# Patient Record
Sex: Female | Born: 1935 | Race: White | Hispanic: No | Smoking: Former smoker
Health system: Southern US, Community
[De-identification: ages and names within clinical notes are randomized; demographics above are authoritative.]

## PROBLEM LIST (undated history)

## (undated) DIAGNOSIS — M069 Rheumatoid arthritis, unspecified: Secondary | ICD-10-CM

## (undated) DIAGNOSIS — E559 Vitamin D deficiency, unspecified: Secondary | ICD-10-CM

## (undated) DIAGNOSIS — I272 Pulmonary hypertension, unspecified: Secondary | ICD-10-CM

## (undated) DIAGNOSIS — D649 Anemia, unspecified: Secondary | ICD-10-CM

## (undated) DIAGNOSIS — E119 Type 2 diabetes mellitus without complications: Secondary | ICD-10-CM

## (undated) DIAGNOSIS — M519 Unspecified thoracic, thoracolumbar and lumbosacral intervertebral disc disorder: Secondary | ICD-10-CM

## (undated) DIAGNOSIS — D696 Thrombocytopenia, unspecified: Secondary | ICD-10-CM

## (undated) DIAGNOSIS — E785 Hyperlipidemia, unspecified: Secondary | ICD-10-CM

## (undated) DIAGNOSIS — K7460 Unspecified cirrhosis of liver: Secondary | ICD-10-CM

## (undated) DIAGNOSIS — IMO0002 Reserved for concepts with insufficient information to code with codable children: Secondary | ICD-10-CM

## (undated) DIAGNOSIS — Z993 Dependence on wheelchair: Secondary | ICD-10-CM

## (undated) DIAGNOSIS — R32 Unspecified urinary incontinence: Secondary | ICD-10-CM

## (undated) DIAGNOSIS — J849 Interstitial pulmonary disease, unspecified: Secondary | ICD-10-CM

## (undated) DIAGNOSIS — I4891 Unspecified atrial fibrillation: Secondary | ICD-10-CM

## (undated) DIAGNOSIS — IMO0001 Reserved for inherently not codable concepts without codable children: Secondary | ICD-10-CM

## (undated) DIAGNOSIS — I2510 Atherosclerotic heart disease of native coronary artery without angina pectoris: Secondary | ICD-10-CM

## (undated) DIAGNOSIS — M359 Systemic involvement of connective tissue, unspecified: Secondary | ICD-10-CM

## (undated) DIAGNOSIS — N189 Chronic kidney disease, unspecified: Secondary | ICD-10-CM

## (undated) DIAGNOSIS — M1990 Unspecified osteoarthritis, unspecified site: Secondary | ICD-10-CM

## (undated) DIAGNOSIS — G4733 Obstructive sleep apnea (adult) (pediatric): Secondary | ICD-10-CM

## (undated) DIAGNOSIS — R609 Edema, unspecified: Secondary | ICD-10-CM

## (undated) DIAGNOSIS — Z515 Encounter for palliative care: Secondary | ICD-10-CM

## (undated) DIAGNOSIS — M4800 Spinal stenosis, site unspecified: Secondary | ICD-10-CM

## (undated) DIAGNOSIS — E878 Other disorders of electrolyte and fluid balance, not elsewhere classified: Secondary | ICD-10-CM

## (undated) DIAGNOSIS — I5032 Chronic diastolic (congestive) heart failure: Secondary | ICD-10-CM

## (undated) DIAGNOSIS — K222 Esophageal obstruction: Secondary | ICD-10-CM

## (undated) DIAGNOSIS — N179 Acute kidney failure, unspecified: Secondary | ICD-10-CM

## (undated) DIAGNOSIS — I10 Essential (primary) hypertension: Secondary | ICD-10-CM

## (undated) DIAGNOSIS — K219 Gastro-esophageal reflux disease without esophagitis: Secondary | ICD-10-CM

## (undated) DIAGNOSIS — E669 Obesity, unspecified: Secondary | ICD-10-CM

## (undated) HISTORY — DX: Essential (primary) hypertension: I10

## (undated) HISTORY — PX: ROTATOR CUFF REPAIR: SHX139

## (undated) HISTORY — DX: Unspecified thoracic, thoracolumbar and lumbosacral intervertebral disc disorder: M51.9

## (undated) HISTORY — DX: Systemic involvement of connective tissue, unspecified: M35.9

## (undated) HISTORY — PX: TUBAL LIGATION: SHX77

## (undated) HISTORY — PX: REPLACEMENT TOTAL KNEE: SUR1224

## (undated) HISTORY — DX: Reserved for concepts with insufficient information to code with codable children: IMO0002

## (undated) HISTORY — DX: Rheumatoid arthritis, unspecified: M06.9

## (undated) HISTORY — DX: Unspecified urinary incontinence: R32

## (undated) HISTORY — DX: Spinal stenosis, site unspecified: M48.00

## (undated) HISTORY — DX: Unspecified cirrhosis of liver: K74.60

## (undated) HISTORY — DX: Atherosclerotic heart disease of native coronary artery without angina pectoris: I25.10

## (undated) HISTORY — DX: Hyperlipidemia, unspecified: E78.5

## (undated) HISTORY — DX: Unspecified atrial fibrillation: I48.91

## (undated) HISTORY — DX: Gastro-esophageal reflux disease without esophagitis: K21.9

## (undated) HISTORY — DX: Acute kidney failure, unspecified: N17.9

## (undated) HISTORY — DX: Edema, unspecified: R60.9

## (undated) HISTORY — DX: Type 2 diabetes mellitus without complications: E11.9

## (undated) HISTORY — DX: Esophageal obstruction: K22.2

## (undated) HISTORY — DX: Obesity, unspecified: E66.9

## (undated) HISTORY — DX: Other disorders of electrolyte and fluid balance, not elsewhere classified: E87.8

## (undated) HISTORY — DX: Dependence on wheelchair: Z99.3

## (undated) HISTORY — DX: Reserved for inherently not codable concepts without codable children: IMO0001

## (undated) HISTORY — DX: Encounter for palliative care: Z51.5

## (undated) HISTORY — DX: Morbid (severe) obesity due to excess calories: E66.01

## (undated) HISTORY — DX: Obstructive sleep apnea (adult) (pediatric): G47.33

## (undated) HISTORY — DX: Pulmonary hypertension, unspecified: I27.20

## (undated) HISTORY — PX: ANKLE FUSION: SHX881

## (undated) HISTORY — PX: APPENDECTOMY: SHX54

## (undated) HISTORY — DX: Chronic diastolic (congestive) heart failure: I50.32

## (undated) HISTORY — DX: Unspecified osteoarthritis, unspecified site: M19.90

## (undated) HISTORY — DX: Vitamin D deficiency, unspecified: E55.9

## (undated) HISTORY — DX: Interstitial pulmonary disease, unspecified: J84.9

## (undated) HISTORY — DX: Anemia, unspecified: D64.9

---

## 1997-12-06 ENCOUNTER — Ambulatory Visit (HOSPITAL_COMMUNITY): Admission: RE | Admit: 1997-12-06 | Discharge: 1997-12-06 | Payer: Self-pay | Admitting: Obstetrics & Gynecology

## 1998-05-09 ENCOUNTER — Inpatient Hospital Stay (HOSPITAL_COMMUNITY): Admission: AD | Admit: 1998-05-09 | Discharge: 1998-05-11 | Payer: Self-pay | Admitting: Gastroenterology

## 1998-06-30 ENCOUNTER — Encounter: Payer: Self-pay | Admitting: Orthopaedic Surgery

## 1998-07-03 ENCOUNTER — Inpatient Hospital Stay (HOSPITAL_COMMUNITY): Admission: RE | Admit: 1998-07-03 | Discharge: 1998-07-09 | Payer: Self-pay | Admitting: Orthopaedic Surgery

## 1998-11-03 ENCOUNTER — Other Ambulatory Visit: Admission: RE | Admit: 1998-11-03 | Discharge: 1998-11-03 | Payer: Self-pay | Admitting: Obstetrics and Gynecology

## 1998-12-19 ENCOUNTER — Encounter: Admission: RE | Admit: 1998-12-19 | Discharge: 1999-03-19 | Payer: Self-pay | Admitting: Orthopaedic Surgery

## 1999-07-24 ENCOUNTER — Emergency Department (HOSPITAL_COMMUNITY): Admission: EM | Admit: 1999-07-24 | Discharge: 1999-07-24 | Payer: Self-pay

## 1999-07-30 ENCOUNTER — Emergency Department (HOSPITAL_COMMUNITY): Admission: EM | Admit: 1999-07-30 | Discharge: 1999-07-30 | Payer: Self-pay | Admitting: Emergency Medicine

## 1999-09-28 ENCOUNTER — Ambulatory Visit (HOSPITAL_COMMUNITY): Admission: RE | Admit: 1999-09-28 | Discharge: 1999-09-29 | Payer: Self-pay | Admitting: Cardiology

## 1999-10-19 ENCOUNTER — Other Ambulatory Visit: Admission: RE | Admit: 1999-10-19 | Discharge: 1999-10-19 | Payer: Self-pay | Admitting: Obstetrics and Gynecology

## 1999-10-19 ENCOUNTER — Ambulatory Visit (HOSPITAL_COMMUNITY): Admission: RE | Admit: 1999-10-19 | Discharge: 1999-10-19 | Payer: Self-pay | Admitting: Neurology

## 1999-10-27 ENCOUNTER — Encounter (HOSPITAL_COMMUNITY): Admission: RE | Admit: 1999-10-27 | Discharge: 2000-01-25 | Payer: Self-pay | Admitting: Cardiology

## 1999-12-03 ENCOUNTER — Encounter: Payer: Self-pay | Admitting: Obstetrics and Gynecology

## 1999-12-08 ENCOUNTER — Encounter (INDEPENDENT_AMBULATORY_CARE_PROVIDER_SITE_OTHER): Payer: Self-pay

## 1999-12-08 ENCOUNTER — Observation Stay (HOSPITAL_COMMUNITY): Admission: RE | Admit: 1999-12-08 | Discharge: 1999-12-09 | Payer: Self-pay | Admitting: Obstetrics and Gynecology

## 2000-07-04 ENCOUNTER — Encounter: Admission: RE | Admit: 2000-07-04 | Discharge: 2000-07-04 | Payer: Self-pay | Admitting: Orthopaedic Surgery

## 2000-07-04 ENCOUNTER — Encounter: Payer: Self-pay | Admitting: Orthopaedic Surgery

## 2000-07-22 ENCOUNTER — Encounter: Payer: Self-pay | Admitting: Orthopaedic Surgery

## 2000-07-22 ENCOUNTER — Encounter: Admission: RE | Admit: 2000-07-22 | Discharge: 2000-07-22 | Payer: Self-pay | Admitting: Orthopaedic Surgery

## 2000-08-08 ENCOUNTER — Encounter: Payer: Self-pay | Admitting: Orthopaedic Surgery

## 2000-08-08 ENCOUNTER — Encounter: Admission: RE | Admit: 2000-08-08 | Discharge: 2000-08-08 | Payer: Self-pay | Admitting: Orthopaedic Surgery

## 2000-09-28 ENCOUNTER — Encounter: Admission: RE | Admit: 2000-09-28 | Discharge: 2000-12-27 | Payer: Self-pay | Admitting: Family Medicine

## 2000-10-28 ENCOUNTER — Other Ambulatory Visit: Admission: RE | Admit: 2000-10-28 | Discharge: 2000-10-28 | Payer: Self-pay | Admitting: Obstetrics and Gynecology

## 2001-06-27 ENCOUNTER — Encounter: Admission: RE | Admit: 2001-06-27 | Discharge: 2001-06-27 | Payer: Self-pay | Admitting: Family Medicine

## 2001-06-27 ENCOUNTER — Encounter: Payer: Self-pay | Admitting: Family Medicine

## 2001-07-14 ENCOUNTER — Encounter: Payer: Self-pay | Admitting: Family Medicine

## 2001-07-14 ENCOUNTER — Encounter: Admission: RE | Admit: 2001-07-14 | Discharge: 2001-07-14 | Payer: Self-pay | Admitting: Family Medicine

## 2001-10-27 ENCOUNTER — Encounter: Payer: Self-pay | Admitting: Orthopaedic Surgery

## 2001-10-30 ENCOUNTER — Other Ambulatory Visit: Admission: RE | Admit: 2001-10-30 | Discharge: 2001-10-30 | Payer: Self-pay | Admitting: Obstetrics and Gynecology

## 2001-11-02 ENCOUNTER — Inpatient Hospital Stay (HOSPITAL_COMMUNITY): Admission: RE | Admit: 2001-11-02 | Discharge: 2001-11-06 | Payer: Self-pay | Admitting: Orthopaedic Surgery

## 2002-01-02 ENCOUNTER — Inpatient Hospital Stay (HOSPITAL_COMMUNITY): Admission: RE | Admit: 2002-01-02 | Discharge: 2002-01-05 | Payer: Self-pay | Admitting: Orthopaedic Surgery

## 2002-03-09 ENCOUNTER — Emergency Department (HOSPITAL_COMMUNITY): Admission: EM | Admit: 2002-03-09 | Discharge: 2002-03-09 | Payer: Self-pay | Admitting: Emergency Medicine

## 2002-03-09 ENCOUNTER — Encounter: Payer: Self-pay | Admitting: Emergency Medicine

## 2002-04-12 ENCOUNTER — Encounter: Admission: RE | Admit: 2002-04-12 | Discharge: 2002-04-12 | Payer: Self-pay | Admitting: Neurology

## 2002-04-12 ENCOUNTER — Encounter: Payer: Self-pay | Admitting: Neurology

## 2002-05-09 ENCOUNTER — Inpatient Hospital Stay (HOSPITAL_COMMUNITY): Admission: RE | Admit: 2002-05-09 | Discharge: 2002-05-14 | Payer: Self-pay | Admitting: Orthopedic Surgery

## 2002-05-09 ENCOUNTER — Encounter: Payer: Self-pay | Admitting: Orthopedic Surgery

## 2002-09-18 ENCOUNTER — Ambulatory Visit (HOSPITAL_BASED_OUTPATIENT_CLINIC_OR_DEPARTMENT_OTHER): Admission: RE | Admit: 2002-09-18 | Discharge: 2002-09-18 | Payer: Self-pay | Admitting: Orthopedic Surgery

## 2003-12-16 ENCOUNTER — Encounter: Admission: RE | Admit: 2003-12-16 | Discharge: 2003-12-16 | Payer: Self-pay | Admitting: Orthopedic Surgery

## 2003-12-17 ENCOUNTER — Ambulatory Visit (HOSPITAL_BASED_OUTPATIENT_CLINIC_OR_DEPARTMENT_OTHER): Admission: RE | Admit: 2003-12-17 | Discharge: 2003-12-17 | Payer: Self-pay | Admitting: Orthopedic Surgery

## 2003-12-17 ENCOUNTER — Ambulatory Visit (HOSPITAL_COMMUNITY): Admission: RE | Admit: 2003-12-17 | Discharge: 2003-12-17 | Payer: Self-pay | Admitting: Orthopedic Surgery

## 2004-01-01 ENCOUNTER — Encounter: Admission: RE | Admit: 2004-01-01 | Discharge: 2004-01-01 | Payer: Self-pay | Admitting: Orthopedic Surgery

## 2004-01-07 ENCOUNTER — Other Ambulatory Visit: Admission: RE | Admit: 2004-01-07 | Discharge: 2004-01-07 | Payer: Self-pay | Admitting: *Deleted

## 2004-11-20 ENCOUNTER — Encounter: Admission: RE | Admit: 2004-11-20 | Discharge: 2004-11-20 | Payer: Self-pay | Admitting: Rheumatology

## 2006-01-12 ENCOUNTER — Other Ambulatory Visit: Admission: RE | Admit: 2006-01-12 | Discharge: 2006-01-12 | Payer: Self-pay | Admitting: Obstetrics and Gynecology

## 2006-12-15 ENCOUNTER — Encounter: Payer: Self-pay | Admitting: Pulmonary Disease

## 2008-02-29 ENCOUNTER — Inpatient Hospital Stay (HOSPITAL_COMMUNITY): Admission: RE | Admit: 2008-02-29 | Discharge: 2008-03-05 | Payer: Self-pay | Admitting: Orthopedic Surgery

## 2008-05-08 ENCOUNTER — Encounter: Payer: Self-pay | Admitting: Pulmonary Disease

## 2008-06-07 ENCOUNTER — Ambulatory Visit: Payer: Self-pay | Admitting: Vascular Surgery

## 2008-09-09 ENCOUNTER — Encounter: Payer: Self-pay | Admitting: Pulmonary Disease

## 2008-09-10 ENCOUNTER — Encounter: Payer: Self-pay | Admitting: Pulmonary Disease

## 2008-09-26 ENCOUNTER — Encounter: Payer: Self-pay | Admitting: Pulmonary Disease

## 2008-10-02 ENCOUNTER — Encounter: Payer: Self-pay | Admitting: Pulmonary Disease

## 2008-10-16 ENCOUNTER — Encounter: Admission: RE | Admit: 2008-10-16 | Discharge: 2008-10-16 | Payer: Self-pay | Admitting: Family Medicine

## 2008-10-16 ENCOUNTER — Encounter: Payer: Self-pay | Admitting: Pulmonary Disease

## 2008-10-28 ENCOUNTER — Telehealth: Payer: Self-pay | Admitting: Pulmonary Disease

## 2008-10-29 DIAGNOSIS — I2510 Atherosclerotic heart disease of native coronary artery without angina pectoris: Secondary | ICD-10-CM | POA: Insufficient documentation

## 2008-10-29 DIAGNOSIS — IMO0001 Reserved for inherently not codable concepts without codable children: Secondary | ICD-10-CM

## 2008-10-29 DIAGNOSIS — M069 Rheumatoid arthritis, unspecified: Secondary | ICD-10-CM | POA: Insufficient documentation

## 2008-10-29 DIAGNOSIS — K219 Gastro-esophageal reflux disease without esophagitis: Secondary | ICD-10-CM

## 2008-10-29 DIAGNOSIS — IMO0002 Reserved for concepts with insufficient information to code with codable children: Secondary | ICD-10-CM

## 2008-10-29 DIAGNOSIS — M1990 Unspecified osteoarthritis, unspecified site: Secondary | ICD-10-CM | POA: Insufficient documentation

## 2008-10-29 DIAGNOSIS — E119 Type 2 diabetes mellitus without complications: Secondary | ICD-10-CM | POA: Insufficient documentation

## 2008-10-30 ENCOUNTER — Ambulatory Visit: Payer: Self-pay | Admitting: Pulmonary Disease

## 2008-10-30 DIAGNOSIS — J8410 Pulmonary fibrosis, unspecified: Secondary | ICD-10-CM

## 2008-11-20 ENCOUNTER — Ambulatory Visit: Payer: Self-pay | Admitting: Pulmonary Disease

## 2008-11-25 ENCOUNTER — Encounter: Payer: Self-pay | Admitting: Pulmonary Disease

## 2008-12-05 ENCOUNTER — Encounter: Payer: Self-pay | Admitting: Pulmonary Disease

## 2009-01-08 DIAGNOSIS — N179 Acute kidney failure, unspecified: Secondary | ICD-10-CM

## 2009-01-08 HISTORY — DX: Acute kidney failure, unspecified: N17.9

## 2009-01-29 ENCOUNTER — Inpatient Hospital Stay (HOSPITAL_COMMUNITY): Admission: EM | Admit: 2009-01-29 | Discharge: 2009-02-10 | Payer: Self-pay | Admitting: Emergency Medicine

## 2009-02-08 HISTORY — PX: CORONARY ANGIOPLASTY: SHX604

## 2009-03-31 ENCOUNTER — Inpatient Hospital Stay (HOSPITAL_COMMUNITY): Admission: RE | Admit: 2009-03-31 | Discharge: 2009-04-01 | Payer: Self-pay | Admitting: Cardiology

## 2009-05-22 ENCOUNTER — Ambulatory Visit: Payer: Self-pay | Admitting: Pulmonary Disease

## 2009-05-22 DIAGNOSIS — I2789 Other specified pulmonary heart diseases: Secondary | ICD-10-CM

## 2009-05-29 ENCOUNTER — Telehealth: Payer: Self-pay | Admitting: Pulmonary Disease

## 2009-11-17 ENCOUNTER — Ambulatory Visit: Payer: Self-pay | Admitting: Pulmonary Disease

## 2010-03-01 ENCOUNTER — Encounter: Payer: Self-pay | Admitting: Orthopedic Surgery

## 2010-03-10 NOTE — Assessment & Plan Note (Signed)
Summary: rov for ISLD, pulmonary HTN   Copy to:  Arlina Robes Primary Provider/Referring Provider:  Henrine Screws  CC:  followup on pfts.  History of Present Illness: the pt comes in today for f/u of her known ISLD and probable multifactorial pulmonary htn.  She has not seen a big change in her breathing since the last visit, and has had full pfts today.  These show no airflow obstruction, no restriction, and a severe decrease in dlco that corrects to normal with alveolar volume adjustment.  Her TLC and DLCO have mildly decreased over the last one year.  The pt denies any chronic cough or mucus production.  She has been diagnosed with osa since the last visit, and is wearing cpap with oxygen at night.  She also continues to have issues with LE edema.  Preventive Screening-Counseling & Management  Alcohol-Tobacco     Smoking Status: quit > 6 months     Year Quit: 1979  Current Medications (verified): 1)  Requip 0.5 Mg Tabs (Ropinirole Hcl) .... Take 1 Tablet By Mouth Two Times A Day 2)  Plaquenil 200 Mg Tabs (Hydroxychloroquine Sulfate) .... Take 1 Tablet By Mouth Two Times A Day 3)  Folic Acid 1 Mg Tabs (Folic Acid) .... Take 1 Tablet By Mouth Once A Day 4)  Amoxicillin 500 Mg Caps (Amoxicillin) .... Take 4 Tabs 1 Hour Before Dental Work 5)  Multivitamins  Tabs (Multiple Vitamin) .... Take 1 Tablet By Mouth Once A Day 6)  Imodium Advanced 2-125 Mg Tabs (Loperamide-Simethicone) .... Take 1 Tablet By Mouth Two Times A Day 7)  Ecotrin Low Strength 81 Mg Tbec (Aspirin) .... Take 1 Tab By Mouth At Bedtime 8)  Calcium-Phosphorus- .... Take 1 Tablet By Mouth Once A Day 9)  Coq 10 .... Take 1 Tablet By Mouth Two Times A Day 10)  Biotin .... Take 1 Tablet By Mouth Two Times A Day 11)  Vitamin D3 .... Take 1 Tablet By Mouth Once A Day 12)  Glucosamine .... Take 1 Tablet By Mouth Once A Day 13)  Resveratrol .... Take 1 Tablet By Mouth Once A Day 14)  Furosemide 20 Mg Tabs  (Furosemide) .Marland Kitchen.. 1 Once Daily 15)  Lisinopril 10 Mg Tabs (Lisinopril) .... 1/2 Once Daily 16)  Tramadol Hcl 50 Mg Tabs (Tramadol Hcl) .Marland Kitchen.. 1 At Night 17)  Effient 10 Mg Tabs (Prasugrel Hcl) .Marland Kitchen.. 1 By Mouth Qd 18)  Metoprolol Tartrate 50 Mg Tabs (Metoprolol Tartrate) .Marland Kitchen.. 1 and 1/2 Two Times A Day 19)  Simcor 1000-20 Mg Xr24h-Tab (Niacin-Simvastatin) .Marland Kitchen.. 1at Bedtime 20)  Vanadium 7.5 Mg Caps (Vanadium) .Marland Kitchen.. 1qd  Allergies: 1)  ! * Ivp Dye 2)  ! Plavix (Clopidogrel Bisulfate)  Past History:  Past medical, surgical, family and social histories (including risk factors) reviewed, and no changes noted (except as noted below).  Past Medical History: Reviewed history from 10/30/2008 and no changes required.   FIBROMYALGIA (ICD-729.1) GERD (ICD-530.81) DEGENERATIVE DISC DISEASE (ICD-722.6) MORBID OBESITY (ICD-278.01) OSTEOARTHRITIS (ICD-715.90) ARTHRITIS, RHEUMATOID (ICD-714.0)??? DIABETES MELLITUS, TYPE II (ICD-250.00) CORONARY ARTERY DISEASE (ICD-414.00)--s/p pci    Past Surgical History: Reviewed history from 10/30/2008 and no changes required. appendectomy 1924 R rotator cuff repair 1998 R ankle fusion 2000 and 2005 R total knee replacement 2000 L total knee replacement 2003 L ankle fusion 2004 stent 2001 tubal ligation 1972  Family History: Reviewed history from 10/30/2008 and no changes required. rheumatism: mother   Social History: Reviewed history from 10/30/2008 and no changes  required. pt is a widow. pt has 2 sons and 6 grandchildren. pt does not drink alcohol.  Patient states former smoker.  started at age 36.  1/2 to 1 ppd.  quit 1979. pt is  a retired Runner, broadcasting/film/video.   Smoking Status:  quit > 6 months  Review of Systems       The patient complains of headaches, nasal congestion/difficulty breathing through nose, hand/feet swelling, and joint stiffness or pain.  The patient denies shortness of breath with activity, shortness of breath at rest, productive cough,  non-productive cough, coughing up blood, chest pain, irregular heartbeats, acid heartburn, indigestion, loss of appetite, weight change, abdominal pain, difficulty swallowing, sore throat, sneezing, itching, ear ache, anxiety, depression, rash, change in color of mucus, and fever.    Vital Signs:  Patient profile:   77 year old female Height:      61 inches O2 Sat:      93 % on Room air Temp:     98.1 degrees F oral Pulse rate:   58 / minute BP sitting:   140 / 70  (right arm) Cuff size:   large  Vitals Entered By: Kandice Hams CMA (November 17, 2009 10:01 AM)  O2 Flow:  Room air CC: followup on pfts   Physical Exam  General:  morbidly obese female in nad Nose:  no skin breakdown or pressure necrosis from cpap mask Lungs:  crackles in bases bilat, no wheezing Heart:  rrr, no mrg Extremities:  significant LE edema, no cyanosis Neurologic:  alert and oriented, moves all 4.   Impression & Recommendations:  Problem # 1:  INTERSTITIAL LUNG DISEASE (ICD-515) the pt has known ISLD that I suspect is due to her underlying RA.  Her cxr and pfts are not a whole lot different from last year, and she is already being treated by rheumatology for her autoimmune disease.  I have explained to her the mainstay of treatment for this are medications aimed at her RA.  I am not sure she is a great candidate for more aggressive therapy, but will leave that to rheumatology.  Will continue to monitor for objective worsening with cxr's and pfts.  I have also encouraged her to work on weight loss and some type of conditioning program...both would help her doe/QOL.    Problem # 2:  PULMONARY HYPERTENSION (ICD-416.8) the pt has moderated elevation of pressures by echo, but unclear how much due to diastolic dysfunction, her autoimmune disease (doubt), her underlying lung disease, or possibly her sleep apnea which is now being treated.  She is not a good candidate for vasodilator therapy, and therefore would not  do RHC unless her dyspnea worsens considerably.  She does need ongoing diuresis, and is followed closely by cardiology.  Medications Added to Medication List This Visit: 1)  Lisinopril 10 Mg Tabs (Lisinopril) .... 1/2 once daily 2)  Tramadol Hcl 50 Mg Tabs (Tramadol hcl) .Marland Kitchen.. 1 at night 3)  Effient 10 Mg Tabs (Prasugrel hcl) .Marland Kitchen.. 1 by mouth qd 4)  Metoprolol Tartrate 50 Mg Tabs (Metoprolol tartrate) .Marland Kitchen.. 1 and 1/2 two times a day 5)  Simcor 1000-20 Mg Xr24h-tab (Niacin-simvastatin) .Marland Kitchen.. 1at bedtime 6)  Vanadium 7.5 Mg Caps (Vanadium) .Marland Kitchen.. 1qd  Other Orders: Est. Patient Level IV (52841)  Patient Instructions: 1)  will arrange followup with me in 6mos, and will get chest xray the same day.  Please call if having worsening breathing issues 2)  work on weight loss and activity 3)  continue with cpap and nighttime oxygen

## 2010-03-10 NOTE — Letter (Signed)
Summary: [facility]  [facility]   Imported By: Sherian Rein 03/26/2009 14:24:39  _____________________________________________________________________  External Attachment:    Type:   Image     Comment:   External Document

## 2010-03-10 NOTE — Miscellaneous (Signed)
Summary: Orders Update pft charges  Clinical Lists Changes  Orders: Added new Service order of Carbon Monoxide diffusing w/capacity (94720) - Signed Added new Service order of Lung Volumes (94240) - Signed Added new Service order of Spirometry (Pre & Post) (94060) - Signed 

## 2010-03-10 NOTE — Assessment & Plan Note (Signed)
Summary: rov for isld   Copy to:  Arlina Robes Primary Provider/Referring Provider:  Henrine Screws  CC:  6 month follow up pt c/o increase bruising .  History of Present Illness: The pt comes in today for f/u of her known ISLD probably secondary to autoimmune disease.  She is being followed by rheum, and being treated with mtx.  She also has known diastolic dysfunction with moderately elevated pulmonary pressures on echo.  The pt continues to have doe, but feels that her breathing is near her usual baseline.  Her LE edema has been stable.  She denies cough or congestion.  Current Medications (verified): 1)  Requip 0.5 Mg Tabs (Ropinirole Hcl) .... Take 1 Tablet By Mouth Two Times A Day 2)  Plaquenil 200 Mg Tabs (Hydroxychloroquine Sulfate) .... Take 1 Tablet By Mouth Two Times A Day 3)  Celebrex 200 Mg Caps (Celecoxib) .... Take 1 Tablet By Mouth Two Times A Day 4)  Cyclobenzaprine Hcl 1mg  .... Take 1 Tablet By Mouth Three Times A Day 5)  Folic Acid 1 Mg Tabs (Folic Acid) .... Take 1 Tablet By Mouth Once A Day 6)  Promethazine Hcl 25 Mg Tabs (Promethazine Hcl) .... Take 1 Tab By Mouth Every 6 Hours As Needed 7)  Amoxicillin 500 Mg Caps (Amoxicillin) .... Take 4 Tabs 1 Hour Before Dental Work 8)  Multivitamins  Tabs (Multiple Vitamin) .... Take 1 Tablet By Mouth Once A Day 9)  Imodium Advanced 2-125 Mg Tabs (Loperamide-Simethicone) .... Take 1 Tablet By Mouth Two Times A Day 10)  Ecotrin Low Strength 81 Mg Tbec (Aspirin) .... Take 1 Tab By Mouth At Bedtime 11)  Omega 3 Fish Oil .... Take 1 Tablet By Mouth Two Times A Day 12)  Calcium-Phosphorus- .... Take 1 Tablet By Mouth Once A Day 13)  Coq 10 .... Take 1 Tablet By Mouth Two Times A Day 14)  Biotin .... Take 1 Tablet By Mouth Two Times A Day 15)  Vitamin D3 .... Take 1 Tablet By Mouth Once A Day 16)  Glucosamine .... Take 1 Tablet By Mouth Once A Day 17)  Resveratrol .... Take 1 Tablet By Mouth Once A Day 18)   Methotrexate 2.5 Mg Tabs (Methotrexate Sodium) .... 3 Tablets Once A Week 19)  Furosemide 20 Mg Tabs (Furosemide) .Marland Kitchen.. 1 Once Daily 20)  Lisinopril 10 Mg Tabs (Lisinopril) .Marland Kitchen.. 1 Once Daily  Allergies: 1)  ! * Ivp Dye 2)  ! Plavix (Clopidogrel Bisulfate)  Review of Systems      See HPI  Vital Signs:  Patient profile:   77 year old female O2 Sat:      90 % on Room air Temp:     97.8 degrees F oral Pulse rate:   73 / minute BP sitting:   140 / 80  (left arm)  Vitals Entered By: Renold Genta RCP, LPN (May 22, 2009 10:16 AM)  O2 Flow:  Room air CC: 6 month follow up pt c/o increase bruising  Comments Medications reviewed with patient Renold Genta RCP, LPN  May 22, 2009 10:32 AM    Physical Exam  General:  morbidly obese female in nad Lungs:  bibasilar crackles, no wheezing Heart:  rrr, no mrg. Extremities:  2+ edema with venous stasis changes. no cyanosis Neurologic:  alert and oriented, moves all 4.   Impression & Recommendations:  Problem # 1:  INTERSTITIAL LUNG DISEASE (ICD-515) the pt has ISLD that I suspect is related to her  known autoimmune disease.  She has not seen worsening in her breathing, nor is cough an issue for her.  She is being treated primarily with mtx, but this may have to be intensified if she has objective worsening of her disease radiographically or by pfts.  She will f/u in 6mos for her full pfts, but is to call if she has worsening of her dyspnea.  Problem # 2:  PULMONARY HYPERTENSION (ICD-416.8) the pt has moderately elevated PA pressures by her echo, and it is unclear how much of this is due to diastolic dysfunction with pulmonary venous htn, pulm arterial htn related to her autoimmune disease?, or possibly secondary pulm htn related to possible osa/ohs.  She is scheduled for sleep study, and I suspect she has this and would benefit from treatment.  The question to be answered is whether she needs a right heart cath.  It is unclear whether it  would change our treatment regimen.   I do not think she is a great candidate for vasodilator therapy at this point.  If she has worsening of her sob that does not appear to be related to advancing lung disease, would consider RHC and more aggressive therapy if significant pulmonary Arterial htn documented.  For now, she needs better control of fluid balance per cards, and we will see if she has osa that would respond to treatment.  Medications Added to Medication List This Visit: 1)  Methotrexate 2.5 Mg Tabs (Methotrexate sodium) .... 3 tablets once a week 2)  Furosemide 20 Mg Tabs (Furosemide) .Marland Kitchen.. 1 once daily 3)  Lisinopril 10 Mg Tabs (Lisinopril) .Marland Kitchen.. 1 once daily  Other Orders: Est. Patient Level III (16109) T-2 View CXR (71020TC) Pulmonary Referral (Pulmonary)  Patient Instructions: 1)  will check cxr today.  Will call with results 2)  schedule followup with me in 6mos with full breathing tests on the same day. 3)  work on weight loss

## 2010-03-10 NOTE — Progress Notes (Signed)
Summary: results  Phone Note Call from Patient Call back at Home Phone [phone]   Caller: Patient Call For: clance Summary of Call: results. pt aware that kc out of office until monday but she doesn't see why she needs to wait until then.  Initial call taken by: Tivis Ringer, CNA,  May 29, 2009 12:25 PM  Follow-up for Phone Call        spoke with pt. she states she did not want me to send [facility] a messgae requesting results, she states she is ok with waiting until monday fro results. I will still forward messgae to [facility] to make him aware pt called for results. Please advise on CXR results. Thanks. Carron Curie CMA  May 29, 2009 12:41 PM   Additional Follow-up for Phone Call Additional follow up Details #1::        see addendum under cxr report. Additional Follow-up by: Barbaraann Share MD,  May 31, 2009 12:44 PM

## 2010-04-25 LAB — BASIC METABOLIC PANEL
BUN: 18 mg/dL (ref 6–23)
BUN: 28 mg/dL — ABNORMAL HIGH (ref 6–23)
CO2: 25 mEq/L (ref 19–32)
CO2: 27 mEq/L (ref 19–32)
CO2: 27 mEq/L (ref 19–32)
Calcium: 8.5 mg/dL (ref 8.4–10.5)
Calcium: 8.9 mg/dL (ref 8.4–10.5)
Calcium: 9.2 mg/dL (ref 8.4–10.5)
Chloride: 103 mEq/L (ref 96–112)
Chloride: 104 mEq/L (ref 96–112)
Chloride: 106 mEq/L (ref 96–112)
Creatinine, Ser: 1.08 mg/dL (ref 0.4–1.2)
Creatinine, Ser: 1.18 mg/dL (ref 0.4–1.2)
GFR calc Af Amer: 54 mL/min — ABNORMAL LOW (ref >60)
GFR calc Af Amer: 60 mL/min — ABNORMAL LOW (ref >60)
GFR calc non Af Amer: 45 mL/min — ABNORMAL LOW (ref >60)
GFR calc non Af Amer: 49 mL/min — ABNORMAL LOW (ref >60)
Glucose, Bld: 140 mg/dL — ABNORMAL HIGH (ref 70–99)
Glucose, Bld: 144 mg/dL — ABNORMAL HIGH (ref 70–99)
Glucose, Bld: 154 mg/dL — ABNORMAL HIGH (ref 70–99)
Potassium: 4.9 mEq/L (ref 3.5–5.1)
Potassium: 6.1 mEq/L — ABNORMAL HIGH (ref 3.5–5.1)
Sodium: 135 mEq/L (ref 135–145)
Sodium: 137 mEq/L (ref 135–145)
Sodium: 138 mEq/L (ref 135–145)

## 2010-04-25 LAB — GLUCOSE, CAPILLARY
Glucose-Capillary: 112 mg/dL — ABNORMAL HIGH (ref 70–99)
Glucose-Capillary: 113 mg/dL — ABNORMAL HIGH (ref 70–99)
Glucose-Capillary: 131 mg/dL — ABNORMAL HIGH (ref 70–99)
Glucose-Capillary: 132 mg/dL — ABNORMAL HIGH (ref 70–99)
Glucose-Capillary: 133 mg/dL — ABNORMAL HIGH (ref 70–99)
Glucose-Capillary: 155 mg/dL — ABNORMAL HIGH (ref 70–99)
Glucose-Capillary: 174 mg/dL — ABNORMAL HIGH (ref 70–99)
Glucose-Capillary: 176 mg/dL — ABNORMAL HIGH (ref 70–99)

## 2010-04-25 LAB — CBC
HCT: 32.3 % — ABNORMAL LOW (ref 36.0–46.0)
Hemoglobin: 10.7 g/dL — ABNORMAL LOW (ref 12.0–15.0)
MCHC: 33.2 g/dL (ref 30.0–36.0)
MCV: 92.9 fL (ref 78.0–100.0)
Platelets: 211 10*3/uL (ref 150–400)
RBC: 3.48 MIL/uL — ABNORMAL LOW (ref 3.87–5.11)
RDW: 16.5 % — ABNORMAL HIGH (ref 11.5–15.5)
WBC: 9.4 10*3/uL (ref 4.0–10.5)

## 2010-04-25 LAB — POTASSIUM: Potassium: 5.1 mEq/L (ref 3.5–5.1)

## 2010-04-29 LAB — GLUCOSE, CAPILLARY
Glucose-Capillary: 122 mg/dL — ABNORMAL HIGH (ref 70–99)
Glucose-Capillary: 142 mg/dL — ABNORMAL HIGH (ref 70–99)
Glucose-Capillary: 153 mg/dL — ABNORMAL HIGH (ref 70–99)
Glucose-Capillary: 206 mg/dL — ABNORMAL HIGH (ref 70–99)
Glucose-Capillary: 238 mg/dL — ABNORMAL HIGH (ref 70–99)

## 2010-04-29 LAB — CBC
MCHC: 33.3 g/dL (ref 30.0–36.0)
MCV: 97.3 fL (ref 78.0–100.0)
Platelets: 166 10*3/uL (ref 150–400)
RDW: 19.5 % — ABNORMAL HIGH (ref 11.5–15.5)

## 2010-04-29 LAB — BASIC METABOLIC PANEL
BUN: 19 mg/dL (ref 6–23)
CO2: 26 mEq/L (ref 19–32)
Calcium: 8.1 mg/dL — ABNORMAL LOW (ref 8.4–10.5)
Chloride: 104 mEq/L (ref 96–112)
Creatinine, Ser: 0.99 mg/dL (ref 0.4–1.2)
GFR calc Af Amer: >60 mL/min (ref >60)
Glucose, Bld: 190 mg/dL — ABNORMAL HIGH (ref 70–99)

## 2010-05-11 LAB — LIPID PANEL
HDL: 67 mg/dL
Total CHOL/HDL Ratio: 1.8 ratio
Triglycerides: 73 mg/dL
VLDL: 15 mg/dL (ref 0–40)

## 2010-05-11 LAB — CBC
HCT: 30.8 % — ABNORMAL LOW (ref 36.0–46.0)
HCT: 31.9 % — ABNORMAL LOW (ref 36.0–46.0)
HCT: 32 % — ABNORMAL LOW (ref 36.0–46.0)
HCT: 33.9 % — ABNORMAL LOW (ref 36.0–46.0)
HCT: 34.5 % — ABNORMAL LOW (ref 36.0–46.0)
HCT: 36.2 % (ref 36.0–46.0)
Hemoglobin: 10.3 g/dL — ABNORMAL LOW (ref 12.0–15.0)
Hemoglobin: 10.4 g/dL — ABNORMAL LOW (ref 12.0–15.0)
Hemoglobin: 10.8 g/dL — ABNORMAL LOW (ref 12.0–15.0)
Hemoglobin: 11.3 g/dL — ABNORMAL LOW (ref 12.0–15.0)
Hemoglobin: 11.4 g/dL — ABNORMAL LOW (ref 12.0–15.0)
Hemoglobin: 11.9 g/dL — ABNORMAL LOW (ref 12.0–15.0)
Hemoglobin: 12.6 g/dL (ref 12.0–15.0)
MCHC: 32.4 g/dL (ref 30.0–36.0)
MCHC: 32.9 g/dL (ref 30.0–36.0)
MCHC: 33.2 g/dL (ref 30.0–36.0)
MCHC: 33.3 g/dL (ref 30.0–36.0)
MCHC: 33.4 g/dL (ref 30.0–36.0)
MCHC: 33.9 g/dL (ref 30.0–36.0)
MCHC: 34 g/dL (ref 30.0–36.0)
MCV: 92.2 fL (ref 78.0–100.0)
MCV: 92.2 fL (ref 78.0–100.0)
MCV: 92.4 fL (ref 78.0–100.0)
MCV: 93.3 fL (ref 78.0–100.0)
MCV: 93.4 fL (ref 78.0–100.0)
MCV: 93.5 fL (ref 78.0–100.0)
MCV: 94.6 fL (ref 78.0–100.0)
Platelets: 137 K/uL — ABNORMAL LOW (ref 150–400)
Platelets: 143 K/uL — ABNORMAL LOW (ref 150–400)
Platelets: 160 10*3/uL (ref 150–400)
Platelets: 182 K/uL (ref 150–400)
Platelets: 189 K/uL (ref 150–400)
Platelets: 195 K/uL (ref 150–400)
RBC: 3.29 MIL/uL — ABNORMAL LOW (ref 3.87–5.11)
RBC: 3.38 MIL/uL — ABNORMAL LOW (ref 3.87–5.11)
RBC: 3.43 MIL/uL — ABNORMAL LOW (ref 3.87–5.11)
RBC: 3.68 MIL/uL — ABNORMAL LOW (ref 3.87–5.11)
RBC: 3.74 MIL/uL — ABNORMAL LOW (ref 3.87–5.11)
RBC: 3.88 MIL/uL (ref 3.87–5.11)
RBC: 4 MIL/uL (ref 3.87–5.11)
RDW: 15.9 % — ABNORMAL HIGH (ref 11.5–15.5)
RDW: 16.6 % — ABNORMAL HIGH (ref 11.5–15.5)
RDW: 16.6 % — ABNORMAL HIGH (ref 11.5–15.5)
RDW: 16.7 % — ABNORMAL HIGH (ref 11.5–15.5)
RDW: 16.9 % — ABNORMAL HIGH (ref 11.5–15.5)
RDW: 17 % — ABNORMAL HIGH (ref 11.5–15.5)
RDW: 17.1 % — ABNORMAL HIGH (ref 11.5–15.5)
WBC: 10.4 K/uL (ref 4.0–10.5)
WBC: 12.6 K/uL — ABNORMAL HIGH (ref 4.0–10.5)
WBC: 13.6 K/uL — ABNORMAL HIGH (ref 4.0–10.5)
WBC: 17.5 10*3/uL — ABNORMAL HIGH (ref 4.0–10.5)
WBC: 18.3 K/uL — ABNORMAL HIGH (ref 4.0–10.5)
WBC: 9 K/uL (ref 4.0–10.5)

## 2010-05-11 LAB — COMPREHENSIVE METABOLIC PANEL WITH GFR
ALT: 35 U/L (ref 0–35)
ALT: 36 U/L — ABNORMAL HIGH (ref 0–35)
AST: 36 U/L (ref 0–37)
AST: 53 U/L — ABNORMAL HIGH (ref 0–37)
Albumin: 2.8 g/dL — ABNORMAL LOW (ref 3.5–5.2)
Albumin: 3 g/dL — ABNORMAL LOW (ref 3.5–5.2)
Alkaline Phosphatase: 54 U/L (ref 39–117)
Alkaline Phosphatase: 62 U/L (ref 39–117)
BUN: 21 mg/dL (ref 6–23)
BUN: 22 mg/dL (ref 6–23)
CO2: 26 meq/L (ref 19–32)
CO2: 30 meq/L (ref 19–32)
Calcium: 8.2 mg/dL — ABNORMAL LOW (ref 8.4–10.5)
Calcium: 8.4 mg/dL (ref 8.4–10.5)
Chloride: 101 meq/L (ref 96–112)
Chloride: 98 meq/L (ref 96–112)
Creatinine, Ser: 0.93 mg/dL (ref 0.4–1.2)
Creatinine, Ser: 0.99 mg/dL (ref 0.4–1.2)
GFR calc non Af Amer: 55 mL/min — ABNORMAL LOW
GFR calc non Af Amer: 59 mL/min — ABNORMAL LOW
Glucose, Bld: 152 mg/dL — ABNORMAL HIGH (ref 70–99)
Glucose, Bld: 190 mg/dL — ABNORMAL HIGH (ref 70–99)
Potassium: 4.1 meq/L (ref 3.5–5.1)
Potassium: 4.3 meq/L (ref 3.5–5.1)
Sodium: 136 meq/L (ref 135–145)
Sodium: 136 meq/L (ref 135–145)
Total Bilirubin: 0.6 mg/dL (ref 0.3–1.2)
Total Bilirubin: 0.8 mg/dL (ref 0.3–1.2)
Total Protein: 6.3 g/dL (ref 6.0–8.3)
Total Protein: 6.4 g/dL (ref 6.0–8.3)

## 2010-05-11 LAB — POCT CARDIAC MARKERS
CKMB, poc: 7.9 ng/mL (ref 1.0–8.0)
Myoglobin, poc: 222 ng/mL (ref 12–200)
Myoglobin, poc: 406 ng/mL (ref 12–200)
Troponin i, poc: 0.33 ng/mL (ref 0.00–0.09)

## 2010-05-11 LAB — BASIC METABOLIC PANEL WITH GFR
BUN: 41 mg/dL — ABNORMAL HIGH (ref 6–23)
BUN: 50 mg/dL — ABNORMAL HIGH (ref 6–23)
CO2: 28 meq/L (ref 19–32)
CO2: 28 meq/L (ref 19–32)
Calcium: 8.4 mg/dL (ref 8.4–10.5)
Calcium: 8.6 mg/dL (ref 8.4–10.5)
Chloride: 105 meq/L (ref 96–112)
Chloride: 99 meq/L (ref 96–112)
Creatinine, Ser: 1.71 mg/dL — ABNORMAL HIGH (ref 0.4–1.2)
Creatinine, Ser: 2.71 mg/dL — ABNORMAL HIGH (ref 0.4–1.2)
GFR calc non Af Amer: 17 mL/min — ABNORMAL LOW
GFR calc non Af Amer: 29 mL/min — ABNORMAL LOW
Glucose, Bld: 118 mg/dL — ABNORMAL HIGH (ref 70–99)
Glucose, Bld: 136 mg/dL — ABNORMAL HIGH (ref 70–99)
Potassium: 4.7 meq/L (ref 3.5–5.1)
Potassium: 4.9 meq/L (ref 3.5–5.1)
Sodium: 137 meq/L (ref 135–145)
Sodium: 138 meq/L (ref 135–145)

## 2010-05-11 LAB — BASIC METABOLIC PANEL
BUN: 18 mg/dL (ref 6–23)
BUN: 25 mg/dL — ABNORMAL HIGH (ref 6–23)
CO2: 26 mEq/L (ref 19–32)
CO2: 29 mEq/L (ref 19–32)
Calcium: 8 mg/dL — ABNORMAL LOW (ref 8.4–10.5)
Calcium: 8.4 mg/dL (ref 8.4–10.5)
Calcium: 8.5 mg/dL (ref 8.4–10.5)
Calcium: 8.6 mg/dL (ref 8.4–10.5)
Chloride: 101 mEq/L (ref 96–112)
Chloride: 101 mEq/L (ref 96–112)
Chloride: 103 mEq/L (ref 96–112)
Creatinine, Ser: 0.78 mg/dL (ref 0.4–1.2)
Creatinine, Ser: 0.81 mg/dL (ref 0.4–1.2)
Creatinine, Ser: 0.81 mg/dL (ref 0.4–1.2)
GFR calc Af Amer: 24 mL/min — ABNORMAL LOW (ref >60)
GFR calc Af Amer: >60 mL/min (ref >60)
GFR calc Af Amer: >60 mL/min (ref >60)
GFR calc Af Amer: >60 mL/min (ref >60)
GFR calc non Af Amer: 20 mL/min — ABNORMAL LOW (ref >60)
GFR calc non Af Amer: 51 mL/min — ABNORMAL LOW (ref >60)
GFR calc non Af Amer: >60 mL/min (ref >60)
GFR calc non Af Amer: >60 mL/min (ref >60)
Glucose, Bld: 139 mg/dL — ABNORMAL HIGH (ref 70–99)
Glucose, Bld: 140 mg/dL — ABNORMAL HIGH (ref 70–99)
Glucose, Bld: 163 mg/dL — ABNORMAL HIGH (ref 70–99)
Glucose, Bld: 172 mg/dL — ABNORMAL HIGH (ref 70–99)
Potassium: 3.7 mEq/L (ref 3.5–5.1)
Potassium: 4.7 mEq/L (ref 3.5–5.1)
Sodium: 136 mEq/L (ref 135–145)
Sodium: 137 mEq/L (ref 135–145)
Sodium: 137 mEq/L (ref 135–145)
Sodium: 138 mEq/L (ref 135–145)
Sodium: 140 mEq/L (ref 135–145)
Sodium: 140 mEq/L (ref 135–145)

## 2010-05-11 LAB — URINALYSIS, ROUTINE W REFLEX MICROSCOPIC
Bilirubin Urine: NEGATIVE
Glucose, UA: NEGATIVE mg/dL
Ketones, ur: NEGATIVE mg/dL
Nitrite: NEGATIVE
Nitrite: POSITIVE — AB
Protein, ur: 30 mg/dL — AB
Protein, ur: NEGATIVE mg/dL
Specific Gravity, Urine: 1.009 (ref 1.005–1.030)
Specific Gravity, Urine: 1.016 (ref 1.005–1.030)
Urobilinogen, UA: 0.2 mg/dL (ref 0.0–1.0)
Urobilinogen, UA: 0.2 mg/dL (ref 0.0–1.0)
pH: 7 (ref 5.0–8.0)

## 2010-05-11 LAB — BLOOD GAS, ARTERIAL
Patient temperature: 98.6
pH, Arterial: 7.446 — ABNORMAL HIGH (ref 7.350–7.400)

## 2010-05-11 LAB — DIFFERENTIAL
Basophils Absolute: 0 10*3/uL (ref 0.0–0.1)
Basophils Absolute: 0 10*3/uL (ref 0.0–0.1)
Basophils Absolute: 0 10*3/uL (ref 0.0–0.1)
Basophils Relative: 0 % (ref 0–1)
Basophils Relative: 0 % (ref 0–1)
Eosinophils Absolute: 0.1 10*3/uL (ref 0.0–0.7)
Eosinophils Absolute: 0.2 10*3/uL (ref 0.0–0.7)
Eosinophils Relative: 0 % (ref 0–5)
Eosinophils Relative: 0 % (ref 0–5)
Eosinophils Relative: 3 % (ref 0–5)
Lymphocytes Relative: 17 % (ref 12–46)
Lymphocytes Relative: 22 % (ref 12–46)
Lymphocytes Relative: 6 % — ABNORMAL LOW (ref 12–46)
Lymphs Abs: 0.7 10*3/uL (ref 0.7–4.0)
Lymphs Abs: 1 10*3/uL (ref 0.7–4.0)
Lymphs Abs: 1.2 10*3/uL (ref 0.7–4.0)
Lymphs Abs: 2 10*3/uL (ref 0.7–4.0)
Monocytes Absolute: 0.3 10*3/uL (ref 0.1–1.0)
Monocytes Absolute: 0.4 10*3/uL (ref 0.1–1.0)
Monocytes Absolute: 0.7 10*3/uL (ref 0.1–1.0)
Monocytes Relative: 2 % — ABNORMAL LOW (ref 3–12)
Monocytes Relative: 6 % (ref 3–12)
Neutro Abs: 11.2 10*3/uL — ABNORMAL HIGH (ref 1.7–7.7)
Neutro Abs: 16 10*3/uL — ABNORMAL HIGH (ref 1.7–7.7)
Neutro Abs: 5.6 10*3/uL (ref 1.7–7.7)
Neutro Abs: 6.1 10*3/uL (ref 1.7–7.7)
Neutro Abs: 8 10*3/uL — ABNORMAL HIGH (ref 1.7–7.7)
Neutrophils Relative %: 73 % (ref 43–77)
Neutrophils Relative %: 80 % — ABNORMAL HIGH (ref 43–77)
Neutrophils Relative %: 91 % — ABNORMAL HIGH (ref 43–77)

## 2010-05-11 LAB — FOLATE: Folate: >20 ng/mL

## 2010-05-11 LAB — GLUCOSE, CAPILLARY
Glucose-Capillary: 112 mg/dL — ABNORMAL HIGH (ref 70–99)
Glucose-Capillary: 127 mg/dL — ABNORMAL HIGH (ref 70–99)
Glucose-Capillary: 130 mg/dL — ABNORMAL HIGH (ref 70–99)
Glucose-Capillary: 134 mg/dL — ABNORMAL HIGH (ref 70–99)
Glucose-Capillary: 134 mg/dL — ABNORMAL HIGH (ref 70–99)
Glucose-Capillary: 134 mg/dL — ABNORMAL HIGH (ref 70–99)
Glucose-Capillary: 142 mg/dL — ABNORMAL HIGH (ref 70–99)
Glucose-Capillary: 142 mg/dL — ABNORMAL HIGH (ref 70–99)
Glucose-Capillary: 142 mg/dL — ABNORMAL HIGH (ref 70–99)
Glucose-Capillary: 145 mg/dL — ABNORMAL HIGH (ref 70–99)
Glucose-Capillary: 148 mg/dL — ABNORMAL HIGH (ref 70–99)
Glucose-Capillary: 154 mg/dL — ABNORMAL HIGH (ref 70–99)
Glucose-Capillary: 157 mg/dL — ABNORMAL HIGH (ref 70–99)
Glucose-Capillary: 161 mg/dL — ABNORMAL HIGH (ref 70–99)
Glucose-Capillary: 162 mg/dL — ABNORMAL HIGH (ref 70–99)
Glucose-Capillary: 164 mg/dL — ABNORMAL HIGH (ref 70–99)
Glucose-Capillary: 166 mg/dL — ABNORMAL HIGH (ref 70–99)
Glucose-Capillary: 169 mg/dL — ABNORMAL HIGH (ref 70–99)
Glucose-Capillary: 170 mg/dL — ABNORMAL HIGH (ref 70–99)
Glucose-Capillary: 172 mg/dL — ABNORMAL HIGH (ref 70–99)
Glucose-Capillary: 174 mg/dL — ABNORMAL HIGH (ref 70–99)
Glucose-Capillary: 174 mg/dL — ABNORMAL HIGH (ref 70–99)
Glucose-Capillary: 176 mg/dL — ABNORMAL HIGH (ref 70–99)
Glucose-Capillary: 188 mg/dL — ABNORMAL HIGH (ref 70–99)
Glucose-Capillary: 192 mg/dL — ABNORMAL HIGH (ref 70–99)
Glucose-Capillary: 199 mg/dL — ABNORMAL HIGH (ref 70–99)
Glucose-Capillary: 205 mg/dL — ABNORMAL HIGH (ref 70–99)
Glucose-Capillary: 215 mg/dL — ABNORMAL HIGH (ref 70–99)

## 2010-05-11 LAB — TROPONIN I: Troponin I: 0.65 ng/mL (ref 0.00–0.06)

## 2010-05-11 LAB — IRON AND TIBC
Iron: 10 ug/dL — ABNORMAL LOW (ref 42–135)
Saturation Ratios: 4 % — ABNORMAL LOW (ref 20–55)
TIBC: 231 ug/dL — ABNORMAL LOW (ref 250–470)
UIBC: 221 ug/dL

## 2010-05-11 LAB — URINE MICROSCOPIC-ADD ON

## 2010-05-11 LAB — BRAIN NATRIURETIC PEPTIDE
Pro B Natriuretic peptide (BNP): 101 pg/mL — ABNORMAL HIGH (ref 0.0–100.0)
Pro B Natriuretic peptide (BNP): 126 pg/mL — ABNORMAL HIGH (ref 0.0–100.0)
Pro B Natriuretic peptide (BNP): 130 pg/mL — ABNORMAL HIGH (ref 0.0–100.0)
Pro B Natriuretic peptide (BNP): 158 pg/mL — ABNORMAL HIGH (ref 0.0–100.0)
Pro B Natriuretic peptide (BNP): 180 pg/mL — ABNORMAL HIGH (ref 0.0–100.0)
Pro B Natriuretic peptide (BNP): 272 pg/mL — ABNORMAL HIGH (ref 0.0–100.0)
Pro B Natriuretic peptide (BNP): 530 pg/mL — ABNORMAL HIGH (ref 0.0–100.0)

## 2010-05-11 LAB — RETICULOCYTES
RBC.: 3.47 MIL/uL — ABNORMAL LOW (ref 3.87–5.11)
Retic Count, Absolute: 55.5 K/uL (ref 19.0–186.0)
Retic Ct Pct: 1.6 % (ref 0.4–3.1)

## 2010-05-11 LAB — URINE CULTURE

## 2010-05-11 LAB — HEMOGLOBIN A1C
Hgb A1c MFr Bld: 7.2 % — ABNORMAL HIGH (ref 4.6–6.1)
Mean Plasma Glucose: 160 mg/dL

## 2010-05-11 LAB — CARDIAC PANEL(CRET KIN+CKTOT+MB+TROPI)
CK, MB: 9.5 ng/mL — ABNORMAL HIGH (ref 0.3–4.0)
Relative Index: 1.3 (ref 0.0–2.5)
Total CK: 724 U/L — ABNORMAL HIGH (ref 7–177)
Troponin I: 2.98 ng/mL (ref 0.00–0.06)
Troponin I: 4.32 ng/mL (ref 0.00–0.06)

## 2010-05-11 LAB — VITAMIN B12: Vitamin B-12: >2000 pg/mL — ABNORMAL HIGH (ref 211–911)

## 2010-05-11 LAB — PROTIME-INR
INR: 1.04 (ref 0.00–1.49)
Prothrombin Time: 13.5 seconds (ref 11.6–15.2)

## 2010-05-11 LAB — HEMOCCULT GUIAC POC 1CARD (OFFICE): Fecal Occult Bld: NEGATIVE

## 2010-05-13 ENCOUNTER — Encounter: Payer: Self-pay | Admitting: *Deleted

## 2010-05-18 ENCOUNTER — Other Ambulatory Visit: Payer: Self-pay | Admitting: Pulmonary Disease

## 2010-05-18 ENCOUNTER — Ambulatory Visit: Payer: Self-pay | Admitting: Pulmonary Disease

## 2010-05-18 ENCOUNTER — Ambulatory Visit (INDEPENDENT_AMBULATORY_CARE_PROVIDER_SITE_OTHER)
Admission: RE | Admit: 2010-05-18 | Discharge: 2010-05-18 | Disposition: A | Discharge status: Home / Self Care | Payer: Medicare Other | Source: Ambulatory Visit | Attending: Pulmonary Disease | Admitting: Pulmonary Disease

## 2010-05-18 ENCOUNTER — Encounter: Payer: Self-pay | Admitting: Pulmonary Disease

## 2010-05-18 ENCOUNTER — Ambulatory Visit (INDEPENDENT_AMBULATORY_CARE_PROVIDER_SITE_OTHER): Payer: Medicare Other | Admitting: Pulmonary Disease

## 2010-05-18 VITALS — BP 128/76 | HR 58 | Temp 97.8°F | Ht 61.0 in

## 2010-05-18 DIAGNOSIS — G4733 Obstructive sleep apnea (adult) (pediatric): Secondary | ICD-10-CM

## 2010-05-18 DIAGNOSIS — I2789 Other specified pulmonary heart diseases: Secondary | ICD-10-CM

## 2010-05-18 DIAGNOSIS — J84115 Respiratory bronchiolitis interstitial lung disease: Secondary | ICD-10-CM

## 2010-05-18 DIAGNOSIS — J8410 Pulmonary fibrosis, unspecified: Secondary | ICD-10-CM

## 2010-05-18 DIAGNOSIS — J849 Interstitial pulmonary disease, unspecified: Secondary | ICD-10-CM

## 2010-05-18 NOTE — Patient Instructions (Signed)
Continue to work on weight loss Continue treatment for your sleep apnea and fluid overload followup with me in 6mos, and will do breathing test same day.

## 2010-05-18 NOTE — Progress Notes (Signed)
  Subjective:    Patient ID: Holly [name], female    DOB: December 24, 1931, 77 y.o.   MRN: [id number]  HPI The pt comes in today for f/u of her known ISLD, felt secondary to her known RA.  She also has multifactorial pulm htn that is moderate in severity.  She is being treated with cpap/oxygen at [facility] per Dr. Mayford Knife, as well as diuresis.  She has been noting some increase in sob, but not overly significant.  She does have increased cough with hoarseness, and feels it is due to postnasal drip from pollen/allergies.  She is also on an ACE.     Review of Systems  Constitutional: Negative for fever and unexpected weight change.  HENT: Negative for ear pain, nosebleeds, congestion, sore throat, rhinorrhea, sneezing, trouble swallowing, dental problem, postnasal drip and sinus pressure.   Eyes: Negative for redness and itching.  Respiratory: Positive for cough and shortness of breath. Negative for chest tightness and wheezing.   Cardiovascular: Negative for palpitations and leg swelling.  Gastrointestinal: Negative for nausea and vomiting.  Genitourinary: Negative for dysuria.  Musculoskeletal: Negative for joint swelling.  Skin: Negative for rash.  Neurological: Negative for headaches.  Hematological: Does not bruise/bleed easily.  Psychiatric/Behavioral: Negative for dysphoric mood. The patient is not nervous/anxious.        Objective:   Physical Exam Morbidly obese female, NAD No skin breakdown or pressure necrosis from cpap mask Chest with basilar crackles 1/3 up bilat, no wheezing Cor with rrr, controlled VR LE with 2+ edema bilat, no cyanosis Alert and oriented,moves all 4        Assessment & Plan:

## 2010-05-19 DIAGNOSIS — G4733 Obstructive sleep apnea (adult) (pediatric): Secondary | ICD-10-CM | POA: Insufficient documentation

## 2010-05-19 NOTE — Assessment & Plan Note (Signed)
The pt has multifactorial pulm htn due to morbid obesity with osa, diastolic dysfunction, and her underlying ISLD.  I agree with the plan to diurese, treat osa, oxygen therapy at HS.  I due not think she is a candidate for vasodilator therapy.

## 2010-05-19 NOTE — Assessment & Plan Note (Addendum)
The pt has known ISLD that I suspect is related to her RA.  There is really no good treatment for this except aggressive immunotherapy of her RA.  Even with this, the prognosis is usually not very good.  I think she is a very poor candidate for chronic prednisone therapy given her morbid obesity and DM, and could consider other treatments such as humira,enbrel, etc.  I will leave this to her rheumatologist to consider.  Her cxr today appears very similar to last year, and she is due for f/u pfts at her next visit in 6mos.  She has increased cough that sounds secondary to possible postnasal drip, but she is also on an ACE.  I have asked her to try otc antihistamines, and can also discuss her ACE treatment with cardiology.

## 2010-05-25 LAB — PROTIME-INR
INR: 1 (ref 0.00–1.49)
INR: 2 — ABNORMAL HIGH (ref 0.00–1.49)
INR: 2.4 — ABNORMAL HIGH (ref 0.00–1.49)
Prothrombin Time: 15.4 seconds — ABNORMAL HIGH (ref 11.6–15.2)
Prothrombin Time: 23.7 seconds — ABNORMAL HIGH (ref 11.6–15.2)
Prothrombin Time: 27.7 seconds — ABNORMAL HIGH (ref 11.6–15.2)

## 2010-05-25 LAB — GLUCOSE, CAPILLARY
Glucose-Capillary: 124 mg/dL — ABNORMAL HIGH (ref 70–99)
Glucose-Capillary: 145 mg/dL — ABNORMAL HIGH (ref 70–99)
Glucose-Capillary: 145 mg/dL — ABNORMAL HIGH (ref 70–99)
Glucose-Capillary: 147 mg/dL — ABNORMAL HIGH (ref 70–99)
Glucose-Capillary: 154 mg/dL — ABNORMAL HIGH (ref 70–99)
Glucose-Capillary: 155 mg/dL — ABNORMAL HIGH (ref 70–99)
Glucose-Capillary: 155 mg/dL — ABNORMAL HIGH (ref 70–99)
Glucose-Capillary: 158 mg/dL — ABNORMAL HIGH (ref 70–99)
Glucose-Capillary: 161 mg/dL — ABNORMAL HIGH (ref 70–99)
Glucose-Capillary: 217 mg/dL — ABNORMAL HIGH (ref 70–99)

## 2010-05-25 LAB — CBC
HCT: 37.4 % (ref 36.0–46.0)
Platelets: 185 10*3/uL (ref 150–400)
RDW: 15.2 % (ref 11.5–15.5)

## 2010-05-25 LAB — COMPREHENSIVE METABOLIC PANEL
AST: 60 U/L — ABNORMAL HIGH (ref 0–37)
Albumin: 3.6 g/dL (ref 3.5–5.2)
Alkaline Phosphatase: 64 U/L (ref 39–117)
BUN: 9 mg/dL (ref 6–23)
GFR calc Af Amer: >60 mL/min (ref >60)
Potassium: 4.5 mEq/L (ref 3.5–5.1)
Total Protein: 6.6 g/dL (ref 6.0–8.3)

## 2010-05-25 LAB — APTT: aPTT: 28 seconds (ref 24–37)

## 2010-06-11 ENCOUNTER — Other Ambulatory Visit: Payer: Self-pay | Admitting: Orthopedic Surgery

## 2010-06-11 ENCOUNTER — Ambulatory Visit
Admission: RE | Admit: 2010-06-11 | Discharge: 2010-06-11 | Disposition: A | Discharge status: Home / Self Care | Payer: Medicare Other | Source: Ambulatory Visit | Attending: Orthopedic Surgery | Admitting: Orthopedic Surgery

## 2010-06-11 DIAGNOSIS — M545 Low back pain: Secondary | ICD-10-CM

## 2010-06-23 NOTE — Discharge Summary (Signed)
NAMECHANDLER, STOFER NO.:  [id number]   MEDICAL RECORD NO.:  [id number]          PATIENT TYPE:  INP   LOCATION:  5035                         FACILITY:  [facility]   PHYSICIAN:  Nadara Mustard, MD     DATE OF BIRTH:  [birth year]   DATE OF ADMISSION:  02/29/2008  DATE OF DISCHARGE:  03/05/2008                               DISCHARGE SUMMARY   FINAL DIAGNOSIS:  Charcot collapse, right ankle, with ulceration and  osteomyelitis of the fibula.   SURGICAL PROCEDURES:  1. Excision of the osteomyelitis of the fibula.  2. Right tibial calcaneal fusion.   Discharged to skilled nursing in stable condition, nonweightbearing on  the right lower extremity for 3 weeks.  Physical therapy, progressive  ambulation.   PLAN:  To follow up with Dr. Lajoyce Corners 2 weeks after discharge.   DISCHARGE MEDICATIONS INCLUDE:  1. Coumadin 1 mg p.o. daily.  No INR measurement necessary.  2. Flexeril 5 mg p.o. t.i.d. p.r.n. spasm.  3. Plaquenil 200 mg p.o. b.i.d.  4. Enablex 15 mg p.o. daily.  5. Celebrex 200 mg p.o. b.i.d.  6. Requip 0.5 mg p.o. b.i.d.  7. Vicodin 1 p.o. q.6 h p.r.n. for pain.   HISTORY OF PRESENT ILLNESS:  The patient is a 77 year old woman with  complete Charcot collapse with her right ankle weightbearing on the  fibula with ulceration and osteomyelitis of the fibula who presents at  this time for excision of the osteomyelitis and tibial calcaneal fusion.  The patient's hospital course was essentially unremarkable.  She  underwent the right tibial calcaneal fusion on 01/21.  She received  Kefzol for infection prophylaxis.  A tourniquet was not used.  She was  started on Coumadin postoperatively for DVT prophylaxis.  The patient  progressed slowly with physical therapy and was unable to maintain  nonweightbearing on the right and was scheduled for discharge to skilled  nursing.  The patient is a type 2 diabetic, currently diet controlled.  She was discharged to skilled nursing  in stable condition on January 26  with followup in the office in 2 weeks, nonweightbearing on the right.      Nadara Mustard, MD  Electronically Signed     MVD/MEDQ  D:  03/05/2008  T:  03/05/2008  Job:  [id number]

## 2010-06-23 NOTE — Consult Note (Signed)
NEW PATIENT CONSULTATION   Holly [name], Holly [name] A  DOB:  Mar 06, 1931                                       06/07/2008  ZOXWR#:[id number]   Patient presents today for evaluation of her left heel.  She is a very  pleasant 77 year old white female with a history of severe arthritis,  non-insulin-dependant diabetes, and multiple prior bilateral ankle  surgeries for Charcot joints.  She had undergone a recent extensive  right ankle surgery.  She has recently had an episode of left heel  ulcer, and we are seeing her for further evaluation to rule out arterial  insufficiency.  She does not have any history of peripheral vascular  occlusive disease in the past.  Risk factors are diabetes and  hypertension.  She does not smoke, having quit in 1979.  Does not drink  alcohol.   REVIEW OF SYSTEMS:  Positive for weight gain.  She does weigh 250  pounds.  She is 5 feet 1 inch tall.  She is short of breath with  exertion.  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   Her med list is attached.   PHYSICAL EXAMINATION:  A well-developed, moderately obese white female.  Her blood pressure is 179/71, pulse 60, respirations 18.  Her radial  pulses are 2+.  She does have a palpable left popliteal pulse.  I do not  palpate pedal pulses.  She does have ankle deformity.  She does have a  1.5 cm blister over her lateral heel.  This has been unroofed and does  appear to be partial thickness.  It is well perfused.  She is motor and  sensory intact.   She underwent noninvasive vascular laboratory studies in our office, and  this reveals a normal ankle-arm index on the left and normal biphasic  waveforms.  I discussed this with the patient.  I feel that she does  have adequate flow for healing.  She will continue with Neosporin and a  covering over the blister until it heals and will avoid pressure over  this area.   Larina Earthly, M.D.  Electronically  Signed   TFE/MEDQ  D:  06/07/2008  T:  06/10/2008  Job:  [id number]   cc:   Nadara Mustard, MD

## 2010-06-23 NOTE — Op Note (Signed)
NAMEDENEENE, TARVER NO.:  [id number]   MEDICAL RECORD NO.:  [id number]          PATIENT TYPE:  INP   LOCATION:  5035                         FACILITY:  [facility]   PHYSICIAN:  Nadara Mustard, MD     DATE OF BIRTH:  1931/04/18   DATE OF PROCEDURE:  02/29/2008  DATE OF DISCHARGE:                               OPERATIVE REPORT   PREOPERATIVE DIAGNOSIS:  Charcot collapse, right ankle with Wagner grade  3 ulcer over the fibula.   POSTOPERATIVE DIAGNOSIS:  Charcot collapse, right ankle with Wagner  grade 3 ulcer over the fibula.   PROCEDURE:  Right tibiocalcaneal fusion.   SURGEON:  Nadara Mustard, MD   ANESTHESIA:  General.   ESTIMATED BLOOD LOSS:  Minimal.   ANTIBIOTICS:  Kefzol 2 g.   DRAINS:  None.   COMPLICATIONS:  None.   TOURNIQUET TIME:  None.   DISPOSITION:  To PACU in stable condition.   PROCEDURE:  The patient is a 77 year old woman type 2 diabetic with  complete Charcot collapse of the right ankle.  She is currently  ambulating on her fibular head.  She has a Wagner grade 3 ulcer over the  fibula.  There is no abscess and no cellulitis.  Due to collapse and  ulceration, the patient wished to proceed with surgical intervention at  this time.  Risks and benefits were discussed including infection,  neurovascular injury, collapse of the fusion, need for additional  surgery, and potential for transtibial amputation.  The patient states  she understands and wished to proceed at this time.   DESCRIPTION OF PROCEDURE:  The patient was brought to OR room 1 and  underwent a general anesthetic.  After adequate level of anesthesia  obtained, the patient's right lower extremity was prepped using DuraPrep  and draped into a sterile field.  A lateral incision was made through a  previous incision and carried down to the fibula.  This was resected in  1 block of tissue including the ulcer.  The distal aspect of the fibula  was resected and removed.  The  distal aspect of the tibia using an  oscillating saw was cut just proximal to the mortise, perpendicular to  the long axis of the tibia.  The talus had completely subluxed medially.  Thus, talus fibrinous tissue was removed and the talus was then  repositioned beneath the tibia.  The proximal aspect of the talus was  planed perpendicular to the long axis of the tibia.  Guidewire was then  inserted from the calcaneus into the tibia.  C-arm fluoroscopy verified  reduction.  This was then overreamed with a 13-mm reamer.  The 10 x 180  mm Synthes nail was inserted, and using the external alignment guide, a  spiral blade 65 mm in length was placed posteriorly.  This was locked  with a locking screw.  The wounds were irrigated with normal saline.  The insufflation instruments were removed.  The subcu was closed using 2-  0 Vicryl.  The skin was closed using 2-0 nylon.  An extra small piece of  the Infuse graft was placed over the tibiocalcaneal fusion.  The wounds  were covered with Adaptic orthopedic sponges, ABD dressing, Kerlix, and  Coban.  The patient was extubated and taken to PACU in stable condition.  Plan for discharge to short-term skilled nursing.      Nadara Mustard, MD  Electronically Signed     MVD/MEDQ  D:  02/29/2008  T:  03/01/2008  Job:  [id number]

## 2010-06-26 NOTE — Discharge Summary (Signed)
NAME:  Holly [name], Holly [name]                           ACCOUNT NO.:  [id number]   MEDICAL RECORD NO.:  [id number]                   PATIENT TYPE:  INP   LOCATION:  5019                                 FACILITY:  [facility]   PHYSICIAN:  Claude Manges. [name], M.D.            DATE OF BIRTH:  Jun 09, 1931   DATE OF ADMISSION:  01/02/2002  DATE OF DISCHARGE:  01/05/2002                                 DISCHARGE SUMMARY   ADMISSION DIAGNOSES:  1. Ruptured left quadriceps tendon and fractured patella.  2. History of left total knee arthroplasty in September 2003.  3. Hypertension.  4. Diabetes.  5. Coronary artery disease with stent.  6. Obesity.  7. Fibromyalgia.  8. History of ischemic colitis.  9. Blind right eye.   DISCHARGE DIAGNOSES:  1. Open reduction internal fixation and quadriceps tendon repair of left leg     ruptured quadriceps tendon and fractured patella.  2. Diabetes mellitus.  3. Coronary artery disease with stent.  4. Hypertension.  5. History of fibromyalgia.  6. Chronic low back pain.   HISTORY OF PRESENT ILLNESS:  The patient is a 77 year old white female with  a left total knee arthroplasty in September 2003.  The patient did well  until one week previous to the date of admission when she had initially just  aching pain of the knee when she came in for evaluation.  The patient was  found to have a ruptured quad tendon and patellar fracture of the left knee.   ALLERGIES:  SULFA.  ADHESIVE TAPE.  PLAVIX.   MEDICATIONS:  1. Plaquenil 200 mg p.o. b.i.d.  2. Imodium AD q.a.m.  3. Tenormin 50 mg p.o. daily.  4. Niaspan 2000 mg p.o. daily.  5. Zoloft 100 mg p.o. daily.  6. Celebrex 200 mg p.o. b.i.d.  7. Clonazepam 1 mg p.o. q.h.s.  8. Multivitamin 1 tablet p.o. daily.  9. Ecotrin 325 mg p.o. daily.  10.      Vitamin C 500 mg p.o. daily.   SURGICAL PROCEDURE:  On January 02, 2002 the patient was taken to the OR by  Dr. Norlene Campbell, M.D. assisted by Iran Sizer,  P.A.-C.  Under general  anesthesia the patient underwent an open reduction internal fixation of the  patellar fracture with subcapsular and quad tendon repair.  The patient  tolerated this procedure well.  There were no complications.  The patient  was transferred to the recovery room and then to the orthopedic floor in  good condition.   CONSULTS:  A primary care physician consult was requested for evaluation and  management of the patient's medical history.  Routine physical therapy and  occupational therapy consults were also requested.   HOSPITAL COURSE:  On January 02, 2002 the patient was admitted to [facility] under the care of Dr. Norlene Campbell, M.D.  The patient was  taken to the OR where an  ORIF of the left patellar fracture and capsular  repair and quad tendon repair were performed.  The patient tolerated this  procedure well.  There were no complications and the patient was transferred  to the recovery room and then to the orthopedic floor in good condition.  The patient was then evaluated from the medical standpoint by representative  for primary care physician for management of the patient's medical issues.  During the patient's hospitalization there were no significant medical  issues or deviance from previous medical management.   The patient also incurred three day postoperative care from the orthopedic  standpoint.  The patient worked well with physical therapy.  The wound  remained without any signs of infection.  Her leg remained neuro, motor and  vascularly intact.  She did develop some postoperative blood loss anemia but  this remained asymptomatic and she did not require a blood transfusion.  The  patient worked well with physical therapy and on postop day number three the  patient was medically and orthopedically stable and ready for discharge to  home in good condition.  Arrangements were made for home health, physical  therapy and followup.  The  patient was discharged in good condition.   LABORATORIES:  CBC on January 05, 2002:  WBC 8.4, hemoglobin 9.9,  hematocrit 29.5, platelets 151.  Routine chemistries on January 04, 2002:  Sodium 138, potassium 3.8, glucose 123, BUN 10, creatinine 0.8.  Routine  urinalysis on admission was normal.  Wound cultures were no growth at two  days.   MEDICATIONS UPON DISCHARGE:  1. Colace 100 mg p.o. b.i.d.  2. Senokot 1 tablet p.o. b.i.d.  3. ____________ p.r.n.  4. Reglan 10 mg p.o. q.8h.  5. Percocet 1 or 2 tablets q.4h p.r.n.  6. Tylenol 650 mg p.o. q.6h p.r.n.  7. Robaxin 500 mg p.o. q.6h p.r.n.  8. Lovenox 30 mg subcu q.12h.  9. Zoloft 100 mg p.o. daily.  10.      Plaquenil 200 mg p.o. daily.  11.      Tenormin 50 mg p.o. daily.  12.      Niacin 1000 mg p.o. q.h.s.  13.      Klonopin 0.5 mg p.o. q.h.s.  14.      Imodium 2 mg p.o. daily p.r.n.  15.      Patient to continue routine home medications.  16.      Lovenox 40 mg subcu daily.   ACTIVITY:  Patient should wear leg immobilizer at all times except while in  CPM.   DIET:  No restrictions.   WOUND CARE:  Keep dressing clean and dry, change daily.   SPECIAL INSTRUCTIONS:  Zero to 30 degrees CPM only.  Call Dr. [name] with  any problems.   FOLLOWUP:  The patient should call for a followup appointment with Dr.  [name] one week from date of discharge.  Please call [phone] for  followup appointment.   CONDITION ON DISCHARGE:  The patient's condition upon discharge to home is  listed as improved, good.     Jamelle Rushing, P.A.                      Claude Manges. [name], M.D.    RWK/MEDQ  D:  02/27/2002  T:  02/28/2002  Job:  [id number]

## 2010-06-26 NOTE — Discharge Summary (Signed)
Holly [name], DIEFENDERFER NO.:  [id number]   MEDICAL RECORD NO.:  [id number]                   PATIENT TYPE:   LOCATION:                                       FACILITY:  [facility]   PHYSICIAN:  Claude Manges. Holly [name], M.D.            DATE OF BIRTH:   DATE OF ADMISSION:  11/02/2001  DATE OF DISCHARGE:  11/06/2001                                 DISCHARGE SUMMARY   ADMISSION DIAGNOSES:  1. End-stage osteoarthritis, left knee.  2. Obesity.  3. Type 2 diabetes mellitus.  4. Coronary artery disease with history of stent placement.  5. Hypertension.  6. Fibromyalgia.  7. History of low back pain.  8. Blind right eye.  9. History of ischemic colitis.   DISCHARGE DIAGNOSES:  1. End-stage osteoarthritis, left knee, status post left total knee     arthroplasty.  2. Acute blood loss anemia secondary to surgery.  3. Constipation.  4. Type 2 diabetes mellitus.  5. Obesity.  6. Hypertension.  7. Coronary artery disease with history of stent placement.  8. Fibromyalgia.  9. History of low back pain requiring epidural steroids.  10.      History of ischemic colitis in 2000.  11.      Blind right eye.   SURGICAL PROCEDURE:  On November 02, 2001, the patient underwent a left  total knee arthroplasty by Dr. Claude Manges. Whitfield assisted by Legrand Pitts.  Duffy, P.A.   COMPLICATIONS:  None.   CONSULTANTS:  1. Pharmacy consult for Coumadin therapy, November 02, 2001.  2. Internal medicine consult by [facility] hospitalist on November 02, 2001.  3. Rehab medicine consult, physical therapy and case management consult,     November 03, 2001.  4. Occupational therapy consult, November 04, 2001.   HISTORY OF PRESENT ILLNESS:  This 77 year old white female patient presented  to Dr. Cleophas [name] with a history of a right knee replacement several years  ago.  She has had progressively worsening left knee pain now over the last  three years.  It increases, depending upon the  amount of activity she does,  and the pain is severe enough it interrupts her sleep.  It is a constant  aching sensation without radiation.  It increases with weightbearing or  activity and decreases with rest.  It is currently limiting what she can do  and because of that, she is presenting for a left total knee arthroplasty.   HOSPITAL COURSE:  The patient tolerated her surgical procedure well without  immediate postoperative complications.  She was transfused with one unit of  autologous blood intraoperatively.  A medicine consult was obtained postop  to follow her medical condition after surgery.  She was subsequently  transferred to 5000.  On postop day 1, pain was well-controlled with pain  medications.  T-max was 99.2, vitals were stable.  Left knee dressing was  intact and leg was  neurovascularly intact.  Hemoglobin was 9.9, hematocrit  29.3.  She was started on therapy per protocol.   Postop day 2, her hemoglobin had dropped to 8.7 with hematocrit of 25.2 and  she was symptomatic.  She was subsequently transfused with two units of  packed red blood cells.  Dressing was changed, Hemovac discontinued and she  was switched to p.o. pain medicine.   She continued to make good progress over the next day or so.  Her hemoglobin  bounced up to 9.9 with hematocrit of 29.7 on September 28th.  She remained  afebrile with her vital signs stable and pain well-controlled.  She is ready  for a transfer to the skilled facility today.  At this point, her PT is 21.7  with an INR of 2.1.   DIET:  She can resume her regular no-concentrated-sweets diabetic diet.   MEDICATIONS:  She can resume her preop medications which include:  1. Zoloft 100 mg p.o. every day.  2. Plaquenil 200 mg p.o. every day.  3. Atenolol 50 mg p.o. every day.  4. Niaspan 100 mg p.o. every day.  5. Clonazepam 1 mg p.o. q.h.s.  6. Lidoderm patch 5% applied p.r.n.  7. Vitamin C 500 mg p.o. every day.  8. Magnesium one  tablet p.o. every day.  9. Multivitamin one tablet p.o. every day.   Additional medications at this time include:  1. Coumadin one tablet to take as directed per the pharmacy.  Her PT and INR     will be adjusted per [facility] as needed to keep her     INR about 2.  2. Percocet 5/325 mg one to tablets p.o. q.4h. p.r.n. for pain, 50 with no     refill.   SPECIAL DISCHARGE INSTRUCTIONS:  She needs to hold off on the coenzyme Q,  Ecotrin and Celebrex at this time.   ACTIVITY:  She is to be out of bed partial weightbearing of 50% or less on  the left leg on the walker.  She is arranged for home health physical  therapy and R.N. per [facility].  She does need a wheelchair to  assist her with getting to and from the dining room at [facility].   WOUND CARE:  She needs to keep her left knee incision clean and dry and may  shower after no drainage from the wound for two days.  She is to notify Dr.  Cleophas [name] of temperature greater than or equal to 101.5, chills, pain  unrelieved by pain medications or foul-smelling drainage from the wounds.   FOLLOWUP:  She needs to set up a return appointment with Dr. Cleophas [name] in  approximately 10 to 12 days and needs to call [phone] for that appointment.  She will have staple removal with Steri-Strips with Benzoin applied at that  time.   LABORATORY AND ACCESSORY CLINICAL DATA:  On September 19th, hemoglobin 11,  hematocrit 32.5.  September 26th, hemoglobin 9.9, hematocrit 29.3, platelets  135,000.  On September 27th, hemoglobin started at 8.7 with hematocrit of  25.2 and platelets of 126,000 and then at 5:20 that evening, hemoglobin was  9.5 with hematocrit of 28.5 and platelets of 132,000.  September 28th,  hemoglobin 9.9, hematocrit 29.7 and platelets 124,000.   On September 19th, PT was 14.5, INR 1.1.  On November 06, 2001, PT is 21.7,  INR 2.1.  On September 19th, glucose 132.  On September 26th, glucose 146 and  on  September 27th, sodium  133, potassium 4.1, chloride 104, CO2 26, glucose  135, BUN 9, creatinine 0.7 and calcium 8.1.  All other laboratory studies  were within normal limits.   Chest x-ray done on October 27, 2001 showed chronic bronchitic changes but  no other abnormalities noted.      Legrand Pitts Duffy, P.A.                      Claude Manges. Holly [name], M.D.    KED/MEDQ  D:  11/06/2001  T:  11/06/2001  Job:  [id number]   cc:   Chales Salmon. Abigail Miyamoto, M.D.

## 2010-06-26 NOTE — H&P (Signed)
NAME:  Holly [name], Holly [name]                           ACCOUNT NO.:  [id number]   MEDICAL RECORD NO.:  [id number]                   PATIENT TYPE:  INP   LOCATION:  NA                                   FACILITY:  [facility]   PHYSICIAN:  Leonides Grills, M.D.                  DATE OF BIRTH:  11/30/1931   DATE OF ADMISSION:  DATE OF DISCHARGE:                                HISTORY & PHYSICAL   CHIEF COMPLAINT:  Left ankle constant pain.   HISTORY OF PRESENT ILLNESS:  She has had left ankle pain secondary to  arthritic changes that has been interfering with her life and she cannot do  what she wants at this point.  All conservative treatments have failed at  this point.   ALLERGIES:  1. PLAVIX that causes a rash.  2. ADHESIVE TAPE.   MEDICATIONS:  1. Clonazepam 1 mg q.h.s. p.r.n. restless leg.  2. Plaquenil 200 mg b.i.d.  3. Atenolol 50 mg daily.  4. Celebrex 200 mg p.o. b.i.d.  5. Zoloft 100 mg daily.  6. Flexeril 10 mg b.i.d.  If she does not have Flexeril available she can     take meclizine 125 mg b.i.d.  7. Niaspan 2000 mg two q.h.s.  8. Ecotrin daily.  9. Vitamin E.  10.      Vitamin C.  11.      Calcium.  12.      Multivitamin.  13.      Omega 3.  14.      Imodium AD p.r.n.  15.      Lidoderm patch 5 mg p.r.n. back pain.   PAST MEDICAL HISTORY:  1. Depression.  2. Hypertension.  3. Restless leg syndrome.  4. Some autoimmune disease that is being investigated.  5. Osteoarthritis.  6. Back pain.  7. Type 2 diabetes.   PAST SURGICAL HISTORY:  1. Bilateral knees.  2. Tonsillectomy.  3. Carpal tunnel release.  4. Rotator cuff repair.  5. Right ankle fusion.  6. Heart stent in 2001.  7. Tubal ligation.   REVIEW OF SYMPTOMS:  NEUROLOGY:  No seizures, no paralysis.  She does have a  history of frequent headaches, anxiety, depression.  She has had frequent  falls.  She is getting a neurological workup for this.  PULMONARY:  No  asthma, no bronchitis, no chronic obstructive  pulmonary disease, no  shortness of breath, no chronic cough.  CARDIOVASCULAR:  She does have  hypertension.  She states she does get short of breath under exertion.  She  has no angina, no chest pain.  GASTROINTESTINAL:  She states she does have  hemorrhoids.  She has occasional diarrhea, nausea and vomiting.  GENITOURINARY:  She has urinary incontinence.  Trouble with excess urination  at night.  ENDOCRINE:  She has non-insulin dependent diabetes.  She is  maintaining this under diet control.  No intolerance  and no increased  thirst.  HEMATOLOGY:  She did have a history of skin cancer.  She sometimes  states she has usual fatigue and easily bruises.  She has osteoarthritis,  degenerative disk disease, joint pains, joint swelling, morning stiffness in  bilateral knees, hips, ankles.  She is postmenopausal.  She is not taking  hormonal treatment.  She states she has no hot flashes and no breast  disease.   FAMILY HISTORY:  Mother and father with hypertension.  Father with a history  of diabetes.  Grandmother had stomach cancer.  Grandfather had a stroke.  Mother with a history of arthritis.   SOCIAL HISTORY:  She is a widow.  She is retired.  She did smoke, but she  stopped 25 years ago.  She has no alcohol use.  She has two children.  Caregiver after surgery:  She is planning on going to [employer] [employer].  She  lives in a ranch-style home with one story walk out basement, three stairs  into the living room.   PHYSICAL EXAMINATION:  VITAL SIGNS:  Temperature is 98.7, pulse 80,  respirations 18, blood pressure 140/70.  GENERAL:  The patient is a well-developed, well-nourished, slightly obese  female.  HEENT:  She states she is blind in her right eye.  Pupils are otherwise  round, equal, and reactive to light.  NECK:  Supple, no adenopathy, no masses.  CHEST:  Clear to auscultation bilaterally.  No wheezes detected.  BREASTS:  Not pertinent to surgical procedure.  HEART:  Regular rate  and rhythm, no murmurs.  ABDOMEN:  Positive bowel sounds, soft, nontender to palpation.  GENITOURINARY:  Not pertinent to surgical procedure.  EXTREMITIES:  She has bilateral ankle deformities secondary to arthritic  changes, well-healed bilateral knee scars.  Rotator cuff scar on the right.  Ankles:  She is tender over the posterior tibial tendon.  Her deformity  passively corrects with inversion, plantar flexion of the first ray.  She  does have a __________ toe sign on the left compared to the right.  She also  has tenderness in the ankle area and the sinus tarsus area as well.  She has  minimal if any ankle motion at this point.  She has palpable dorsalis pedis  and posterior tibial pulses.  Sensation is intact to light touch and equal  bilaterally.  SKIN:  She does have ecchymoses on the posterior left arm secondary to a  fall recently.   LABORATORY DATA:  On x-ray weightbearing AP and lateral bilateral foot and  ankle views were obtained and showed severe left ankle arthritis and  previous right ankle fusion that may be a non-union.   IMPRESSION:  1. She has right greater then left subtalar arthritis and subfibular     impingement secondary to pes plana valgum deformity.  2. Diet controlled diabetes without peripheral neuropathy.   PLAN:  Recommend surgery.  We would do a left ankle fusion, lengthening  calcaneal osteotomy, iliac crest bone graft, percutaneous tendon Achilles  lengthening, medializing calcaneal osteotomy, excision of tibial talar  spurs, excision versus reconstruction of posterior tibial tendon, and flexor  digitorum longus to navicular transfer.  This surgery is planned to be at  [facility] on Wednesday, 05/09/02 at approximately 1:30 by Dr. Leonides Grills.      Lianne Cure, P.A.                     Leonides Grills, M.D.    MC/MEDQ  D:  05/03/2002  T:  05/03/2002  Job:  [id number]

## 2010-06-26 NOTE — Op Note (Signed)
NAME:  Holly [name], Holly [name]                           ACCOUNT NO.:  [id number]   MEDICAL RECORD NO.:  [id number]                   PATIENT TYPE:  INP   LOCATION:  2550                                 FACILITY:  [facility]   PHYSICIAN:  Claude Manges. [name], M.D.            DATE OF BIRTH:  April 28, 1931   DATE OF PROCEDURE:  11/02/2001  DATE OF DISCHARGE:                                 OPERATIVE REPORT   PREOPERATIVE DIAGNOSES:  End stage osteoarthritis of the left knee.   POSTOPERATIVE DIAGNOSES:  End stage osteoarthritis of the left knee.   OPERATION PERFORMED:  Left total knee replacement.   SURGEON:  Claude Manges. [name], M.D.   ASSISTANT:  Legrand Pitts. Duffy, P.A.   ANESTHESIA:  General orotracheal anesthesia.   COMPLICATIONS:  None.   COMPONENTS:  Dupuy LCS complete standard femoral component, 2.5 mm keeled  tibial component with a 10 mm bridging bearing and the metal back cruciate  designed patella.  All were secured with polymethyl methacrylate.   DESCRIPTION OF PROCEDURE:  With the patient comfortable on the operating  table and under general orotracheal anesthesia, the nursing staff inserted a  Foley catheter.  The left lower extremity was then placed in a thigh  tourniquet.  The leg was then prepped with Betadine scrub and DuraPrep from  the tourniquet to the midfoot.  Sterile draping was performed.  With the  extremity still elevated, it was Esmarch exsanguinated with a proximal  tourniquet at 350 mmHg.   A midline longitudinal incision was made centered over the patella extending  from the superior pouch to the tibial tubercle.  By sharp dissection, the  incision was carried down to the subcutaneous tissues.  There was abundant  adipose tissue as the patient had large legs.  The first layer of capsule  was incised along the length of the skin incision.  A medial parapatellar  incision was then made with a Bovie to the deep capsule.  There was minimal  clear yellow joint effusion.   The patella was everted 180 degrees and the  knee flexed to 90 degrees.  The knee joint was then explored with complete  absence of articular cartilage in the patella as well as the medial femoral  condyle.  There was a fixed varus position and immediate release was  performed.  There were large osteophytes along the medial and lateral  femoral condyle and large areas of articular cartilage loss along the  lateral femoral condyle.  Osteophytes were removed and synovectomy was  performed with the Bovie.   Preoperatively, we had measured a standard femoral component and  #2.5  tibial component.  These were confirmed intraoperatively.  The appropriate  femoral and tibial jigs were then applied to make the appropriate femoral  and tibial cuts.  The ACL and PCL were sacrificed.  The MCL and LCL remained  intact.  A 10 mm flexion-extension gap were  perfectly symmetrical.  I used a  4 degree distal femoral valgus cut and a 7 degree posterior tibial  inclination.  Cuts were then made into the tibial to allow a keeled  component and the tibial component fit line to line from medial to lateral  femoral condyle.  Osteophytes removed from the posterior femoral condyles  with an osteotome.   The trial components were then inserted and placed through a full range of  motion.  There was no malrotation of the tibial component.  We had full and  slight  hyperextension and no opening to varus or valgus stress.  The  patella was repaired by removing approximately 6 mm of bone leaving 12.5 mm  of bone thickness.  The cruciate design jig was applied to pin the cruciate  cut in the patella.  The trial component was applied, reduced and placed  through a full range of motion without dislocation or subluxation.   The trial components were removed.  The joint was copiously irrigated with  jet saline and antibiotic solution.  The final components were then impacted  with polymethyl methacrylate.  Extraneous  methacrylate was removed.  The  knee was placed in extension.  Patellar clamp was applied to the patella.  After hardening of the methacrylate the joint was inspected and any  extraneous methacrylate was removed with an osteotome.  We again checked the  knee for range of motion and there was no subluxation of the patella.  Lateral release was not necessary.   The tourniquet was deflated.  Bleeders were Bovie coagulated.  There was  some bleeding posteriorly which we were able to stop with packing the wound.  A Hemovac was inserted.   The deep capsule was closed with interrupted #2 Ethibond.  Superficial  capsule closed with a running 0 Vicryl, the subcu with 0 and 2-0 Vicryl.  Skin closed with skin clips.  Sterile bulky dressing was applied followed by  the patient's support hose.  The patient tolerated the procedure well  without complications.                                                Claude Manges. [name], M.D.    PWW/MEDQ  D:  11/02/2001  T:  11/02/2001  Job:  [id number]

## 2010-06-26 NOTE — Consult Note (Signed)
NAME:  Holly [name], Holly [name]                           ACCOUNT NO.:  [id number]   MEDICAL RECORD NO.:  [id number]                   PATIENT TYPE:  INP   LOCATION:  5005                                 FACILITY:  [facility]   PHYSICIAN:  Deirdre Peer. Polite, M.D.              DATE OF BIRTH:  [birth year]   DATE OF CONSULTATION:  DATE OF DISCHARGE:                                   CONSULTATION   CHIEF COMPLAINT:  I am sleepy.  The patient  denies pain at this time.   HISTORY OF PRESENT ILLNESS:  This is a 77 year old female who is followed in  primary care by Dr. Abigail Miyamoto with severe osteoarthritis and status post a  right total knee replacement three years ago.  The patient  is now status  post a left total knee replacement due to increase in pain and grinding. Dr.  Francis Dowse performed a left total knee replacement on 11/02/01 with no  intraoperative complications.  The patient  has multiple medical problems  requiring medical consultation.   PAST MEDICAL HISTORY:  1. Non-insulin dependent diabetes mellitus.  2. Osteoarthritis.  3. Hypertension.  4. Urinary urgency.  5. History of one episode of ischemic colitis in 4/00.  6. Lower back pain.  7. Blindness in her right eye secondary to central retinal artery occlusion.  8. Bilateral tinnitus.  9. Hyperlipidemia.  10.      Probable rheumatoid arthritis.  11.      Fibromyalgia.  12.      Likely sleep disorder.  13.      Atherosclerotic cardiovascular disease status post silent     myocardial infarction in 2001.  14.      Irritable bowel syndrome.   PAST SURGICAL HISTORY:  1. Appendectomy in 1960.  2. Bilateral tubal ligation in 1972.  3. Left carpal tunnel release in 1997 by Dr. Teressa Senter.  4. Right ankle fusion in 1/00 by Dr. [name].  5. Release of left trigger thumb in 1998 by Dr. Teressa Senter.  6. Tonsillectomy in her childhood.  7. Right rotator cuff repair in 1998 by Dr. Teressa Senter.  8. Right total knee in 5/00 by Dr. [name].  9. History of  cervical fusion.   CONSULTATIONS:  1. Dr. [name], orthopedics.  2. Dr. Teressa Senter.  3. Dr. Amil Amen, cardiology.  4. Dr. Matthias Hughs, gastroenterology.  5. Dr. Corliss Skains, rheumatology.   PAST PROCEDURES::  1. In 5/00, she had bilateral lower extremity Dopplers which were negative     for DVTs.  2. She had a Cardiolite in 2001 which revealed predominantly thick minimally     reversible anterior and lateral defects extending from near apex to the     base, LV systolic function 68%, LV volume  normal.  3. Cardiac catheterization 09/28/99 with a stent to the LAD by Dr. Amil Amen     revealed normal LV systolic function, ejection fraction 67%, focal  anterior lateral akinesis, LM normal, LAD with 75 to 80% occlusion,     diagonal with 70% occlusion, RCA proximal with 30 to 40% occlusion.  4. EGD/colonoscopy with biopsies in 5/00 by Dr. Matthias Hughs.  Path report     positive for ischemic colitis.  5. Status post esophageal dilatation.   ALLERGIES:  1. PLAVIX CAUSES MACULOPAPULAR RASH.  2. ADHESIVE TAPE CAUSES A MACULOPAPULAR RASH.   CURRENT MEDICATIONS:  1. Diovan 160/12.5 of hydrochlorothiazide two tabs daily.  2. Omeprazole 20 mg daily.  3. Zoloft 100 mg.  4. Coumadin per pharmacy protocol to begin on 11/03/01.  5. Ancef 1 g every eight hours x 3 doses.  6. Morphine PCA.  7. Aspirin 325 mg daily.  8. Colace 100 mg twice a day.  9. Senokot one twice a day.  10.      Plaquenil 200 mg twice a day.  11.      Percocet one to two every four hours p.r.n.  for pain.  12.      Phenergan 12.5 mg IV or 25 mg p.o. or rectally every six hours.  13.      Tylenol 325 to 650 mg every four hours p.r.n.  14.      Atenolol 50 mg daily.  15.      Multivitamin daily.  16.      Imodium half tab daily p.r.n.  17.      Clonazepam 0.5 mg at bedtime.  18.      Niaspan 1000 mg two tabs at bedtime.  19.      Reglan 10 mg p.o. or IV p.r.n.  for nausea.   FAMILY HISTORY:  Her mother died at the age of 81 during a  hip replacement  operation.  She also had a history of osteoporosis, rheumatoid arthritis,  pernicious anemia, and hypertension. Her father died at the age of 45 with a  history of diabetes, hypertension and Kimmelstiel-Wilson/Wilson disease.  She has two sisters alive.  One with a history of diabetes mellitus.   SOCIAL HISTORY:  She is widowed. She is retired Programmer, [employer]. She has two sons who are alive and well with no history of  hypertension, diabetes or cardiac disease.  She does have a remote history  of tobacco use.  She quit approximately  20 years ago.  She has no history  of alcohol or drugs.  She does live alone in a [employer].   REVIEW OF SYSTEMS:  GENERAL:  There has been a 25 to 30 pound intentional  weight loss. No fever. No chills. Occasional night sweats.  HEENT:  She has  history of scalp laceration with suturing. No head injury. She is blind in  her right eye.  She has bilateral cataracts.  She does wear prescription  lenses.  She has bilateral tenderness and upper partial plate. BREASTS:  Normal.  RESPIRATORY: She denies any shortness of breath or wheezing.  She  does report some dyspnea with exertion.  CARDIAC:  She does have a history  of a PTCA and a stent, silent MI and hypertension. GI:  She does have a  history of dysphagia which she attributes to a calcium channel blocker.  She  is status post dilation.  She does have occasional diarrhea and does have  history of irritable bowel syndrome.  URINARY:  She does report a history of  urinary frequency.  GU:  Normal.  Her last Pap smear was in 9/03 which was  normal. MUSCULOSKELETAL:  She does have a history of osteoarthritis status  post bilateral total knee replacement.  She has a history of rheumatoid  arthritis, fibromyalgia. NEUROLOGIC:  She denies any CVAs or dizziness or seizures.  HEMATOLOGIC:  She does report bruising easily.  She does take  aspirin before her niacin. ENDOCRINE:   Positive for non-insulin dependent  diabetes mellitus which diet controlled and no thyroid problem.   PHYSICAL EXAMINATION:  VITAL SIGNS:  Temperature 97.8, blood pressure  152/50, pulse 70, respirations 16. O2 saturation 98% on three liters.  GENERAL:  This is an obese slightly lethargic female in no acute distress.  HEENT:  Normocephalic, atraumatic.  Her pupils are equal, round, reactive to  light and accommodation.  Extraocular movements are intact.  Sclerae and  conjunctivae are clear.  External pinnae are normal.  Her hearing is grossly  normal.  Oropharynx is without lesions.  NECK:  Supple.  No thyromegaly.  No adenopathy.  No bruits.  LUNGS:  Limited due to patient's positioning.  She was clear to auscultation  bilaterally Anteriorly.  CARDIOVASCULAR:  Regular rate and rhythm.  No murmurs, gallops or rubs.  She  has 3+ radial pulses.  BREASTS:  No masses.  ABDOMEN:  Soft, nondistended, nontender with hypoactive bowel sounds.  GU:  She has normal external female genitalia.  She does have a Foley  catheter straight drain with clear yellow urine.  RECTAL:  Deferred due to patient positioning and comfort.  EXTREMITIES:  Her left knee is swollen currently in a CPM, otherwise no  edema, clubbing or cyanosis of her other extremities.  She has 2+ pedal  pulses.  NEURO:  She is lethargic, but easily arousable.  She is oriented x3.  Her  cranial nerves II-XII are grossly intact.  Her strength is 5/5 in all  extremities except her left leg was not tested.   IMPRESSION AND PLAN::  1. Left total knee replacement.  Management per orthopedics.  2. Pain control.  Currently on morphine PCA.  3. Hypertension.  Controlled at this time.  Will continue to monitor,     continue Diovan and hydrochlorothiazide.  4. Non-insulin dependent diabetes mellitus, diet controlled.  Will continue     ADA guide as tolerated.  CBGs a.c. and h.s. with insulin sensitive     sliding scale coverage.  5. Deep  vein thrombosis prophylaxis.  Coumadin per pharmacy, protocol TED     hose to right leg.  6. Anemia.  Most likely secondary to operative loss.  The patient  was     transfused with one unit autologous blood intraoperatively. Will follow     CBCs.  7. Postop complications will attempt to mobilize the patient  as soon as     possible with clearance from orthopedics, have patient to turn, cough,     deep breathe every one hour along with incentive spirometry while awake.     PT and OT are to evaluate and treat.     Stephanie Swaziland, N.P.                    Deirdre Peer. Polite, M.D.    SJ/MEDQ  D:  11/03/2001  T:  11/06/2001  Job:  [id number]

## 2010-06-26 NOTE — Discharge Summary (Signed)
NAME:  Holly [name], Holly [name]                           ACCOUNT NO.:  [id number]   MEDICAL RECORD NO.:  [id number]                   PATIENT TYPE:  INP   LOCATION:  0484                                 FACILITY:  [facility]   PHYSICIAN:  Leonides Grills, M.D.                  DATE OF BIRTH:  05-29-1931   DATE OF ADMISSION:  05/09/2002  DATE OF DISCHARGE:  05/14/2002                                 DISCHARGE SUMMARY   ADMISSION DIAGNOSES:  1. Left ankle constant pain.  2. Depression.  3. Hypertension.  4. Restless leg syndrome.  5. Osteoarthritis.  6. Chronic back pain.  7. Type 2 diabetes, diet controlled.   HISTORY OF PRESENT ILLNESS:  The patient has had chronic left ankle pain  that is constant secondary to arthritic changes that are interfering with  her life, she can no longer do recreational activities without constant  pain.  All conservative treatments have failed up until this point.   HOSPITAL COURSE:  She was taken to the OR on 05/09/2002, by Dr. Leonides Grills.  The patient was stable after surgery and was admitted to [facility], transferred to [facility].  We started her on her home medications  and also added Percocet 325 mg one or two q.4-6h. p.r.n. pain, and Robaxin  500 mg for muscle spasms q.6h. p.r.n.  On postoperative day #1, the patient  was stable, alert and oriented x3, complained of minimal pain, pain well  controlled on p.o. medications.  Hemoglobin was stable at 9.8, hematocrit at  29.1.  She was afebrile and vital signs were stable.  Left lower extremity  splint was clean, dry, and intact.  She could actively wiggle her toes,  capillary refill was brisk.  Sensation was intact and equal bilaterally.  Right calf was soft and nontender to palpation.  Status post left hindfoot  reconstruction with iliac bone graft.  The patient will be non-weightbearing  to the left lower extremity.  Physical therapy for ambulation and transfer  training.  Going to start her on  aspirin 325 mg p.o. b.i.d. for deep venous  thrombosis prophylaxis.  Discharge planning was started.  Rehabilitation,  SACU consult was done.  The patient states she wants to go to [facility] for home health, physical therapy, and occupational  therapy.  On postoperative day #2, the patient is doing well.  Neurovascularly motor intact.  Splint is clean, dry, and intact.  Physical  therapy is non-weightbearing ambulation, gait training.  Blood pressure is  down to 90/50.  There was a consult called for medicine, and they came in  and lowered her Lopressor dose from 50 mg b.i.d. to 25 mg b.i.d.  Hemoglobin  was stable at 8.8, hematocrit 26.5.  On postoperative day #3, the patient  was afebrile and vital signs were stable.  Hemoglobin was at 8.7, hematocrit  25.9.  Splint was clean, dry, and intact.  Negative Homans sign.  Awaiting  discharge planning.  On postoperative day #4, the patient was alert and  oriented x3, comfortable on p.o. medications, she was afebrile and vital  signs were stable.  She is neurovascularly and motor intact, equal  bilaterally.  Splint is clean, dry, and intact.  Temperature max is 100.8.  She is to maintain non-weightbearing status post left hindfoot  reconstruction with iliac bone graft.  She is going to be discharged to  assistive living today, 05/14/2002.   DISCHARGE MEDICATIONS:  1. Percocet for pain 5/325 mg one or two tabs p.o. q.4-6h. p.r.n.  2. Tenormin 50 mg p.o. daily.  3. Zoloft 100 mg p.o. daily.  4. Flexeril 10 mg p.o. b.i.d.  5. Colace 100 mg p.o. b.i.d.  6. Aspirin 325 mg p.o. b.i.d.  7. Klonopin 1 mg q.h.s. p.r.n.  8. Imodium 2 mg p.o. p.r.n.  9. Hydroxychloroquine p.o. b.i.d. 200 mg.  10.      Niaspan 100 mg p.o. q.h.s.  11.      Lopressor 25 mg p.o. b.i.d.   LABORATORY DATA:  Studies that were done preoperatively include, white blood  cell count of 8.9, hemoglobin of 12, hematocrit of 35.1, platelets 162.  Sodium was  140, potassium 4.4, chloride 107, CO2 26, glucose 127, BUN 19,  creatinine 0.7.  Urinalysis which was negative.  Cardiology shows normal  sinus rhythm, left ventricular hypertrophy with repolarization  abnormalities, abnormal electrocardiogram which was unconfirmed at that  point.  A stress test from [facility] was sent over from their office  and medical clearance.   ACTIVITY:  The patient will maintain non-weightbearing status to the left  lower extremity.  I want her to elevate left lower extremity above the level  of the heart, encourage movement of the toes.   WOUND CARE:  Keep the dressing clean, dry, and intact.   DIET:  The patient is on a self-monitored diet, no diet restrictions.   FOLLOWUP:  1. She will get [facility] for physical therapy for ambulation, gait     training, transfer training.  2. We want her to follow up in the office of Dr. Leonides Grills in two weeks     from date of surgery.  The office number is [phone].  Call for an     appointment.     Lianne Cure, P.A.                     Leonides Grills, M.D.    MC/MEDQ  D:  05/14/2002  T:  05/14/2002  Job:  [id number]

## 2010-06-26 NOTE — Cardiovascular Report (Signed)
[place]  Patient:    Holly [name], Holly [name]                        MRN: [id number] Proc. Date: 09/28/99 Adm. Date:  [id number] Attending:  Corliss Marcus CC:         Chales Salmon. Abigail Miyamoto, M.D.             [facility]                        Cardiac Catheterization  REFERRING PHYSICIAN:  Chales Salmon. Abigail Miyamoto, M.D.  INDICATION:  Ms. Cairo Lingenfelter is a 77 year old woman with diabetes, hypertension, elevated cholesterol and a strong family history for coronary disease.  She was referred for myocardial perfusion scintigraphy to rule out the possibility of coexistent occult CAD.  This revealed a partially reversible anterolateral defect.  She is brought now to the catheterization laboratory to identify the extent of disease and provide for further therapeutic options.  PROCEDURES PERFORMED 1. Left heart catheterization. 2. Coronary angiography. 3. Left ventriculogram. 4. Percutaneous coronary intervention/stent implantation, proximal left    anterior descending. 5. Intravascular ultrasound.  PROCEDURAL NOTE:  The patient was brought to the catheterization laboratory in the post-absorptive state.  The right groin was prepped and draped in the usual sterile fashion.  Local anesthesia was obtained with infiltration of 1% lidocaine.  A 5-French catheter sheath was inserted percutaneously into the right femoral artery utilizing an anterior approach over a guiding J wire.  A 5-French 110-cm pigtail catheter was used to measure pressures in the ascending aorta and in the left ventricle, as well as perform a left ventriculography.  Forty-five cc of contrast were injected at 13 cc/sec for the RAO cine left ventriculogram.  Coronary angiography was then performed using 5-French #4 left and right Judkins catheters.  Following the diagnostic portion of the procedure, we proceeded with coronary intervention on a proximal left anterior descending artery  stenosis, which was long, tubular and heavily calcified.  The 6-French catheter sheath was exchanged from a 7-French catheter sheath over a long guiding J wire.  A 6-French catheter sheath was inserted into the right femoral vein for intravenous access, again utilizing an anterior approach over a guiding J wire.  A 7-French FL4 Sci-Med wise-guide guiding catheter was advanced to the ascending aorta where the left coronary os was engaged.  A 0.014-inch Sci-Med luge intracoronary guidewire was advanced across the lesion without difficulty.  Initial balloon dilatation was performed using a 3.0/20-mm Sci-Med Maverick inflated to 6 atmospheres.  This was removed and intravascular ultrasound was performed using a Sci-Med Atlantis catheter. This revealed heavy calcification in some areas throughout a 270 degree arc of the artery; however, it was mostly superficial.  There was not extensive deep calcification.  The decision was made to proceed with stent implantation.  A 3.0/18-mm Sci-Med NIR Royale was advanced into place and inflated to a maximum pressure of 12 atmospheres.  This balloon was removed and intravascular ultrasound repeated.  This showed inadequate stent expansion.  The minimal luminal diameter was 2.0 mm; we therefore removed the stent balloon and replaced it with a Sci-Med [place] Ranger 3.25/15 mm.  This was placed in within the stent and inflated to 16 atmospheres.  Intravascular ultrasound was repeated and found to show excellent stent expansion and wide patency of the artery with a residual lumen of 3.0 x 3.2 mm.  Coronary  angiograms were repeated in orthogonal views and the guidewire was removed.  The guiding catheter was removed as well and the sheath was sutured into place.  The patient was transported to the recovery area in stable condition with intact distal pulses.  Fluoroscopy time was 22.5 minutes and total contrast utilized was 100 cc of Hexabrix and 200 mg of  Omnipaque.  It should be noted that patient received an initial bolus of 5000 units of heparin as well as 320 mg of aspirin orally at the initiation of the procedure; she also received an initial bolus and constant infusion of abciximab (ReoPro).  The initial ACT was 215 seconds; at close, it was 205 seconds and patient received an additional 2000 units of heparin.  HEMODYNAMICS:  Systemic arterial pressure was 156/77 with a mean of 109 mmHg. There was no systolic gradient across the aortic valve.  Left ventricular end-diastolic pressure was 27 mmHg pre-ventriculogram and unchanged post-ventriculogram.  ANGIOGRAPHY:  The left ventriculogram demonstrated intact left ventricular size and global systolic function.  There was heavy calcification seen in the left anterior descending artery, left circumflex and proximal right coronary arteries.  There was no significant mitral regurgitation.  The calculated ejection fraction utilizing a single-plane cine method was 67%.  There was right dominant coronary system present.  The main left coronary artery was normal.  The left anterior descending artery and its branches were highly diseased; this was a relatively large vessel that had a proximal stenosis extending approximately 18 mm up to and involving the origin of a large first diagonal branch.  The greatest luminal stenosis was approximately 75-80%; again, it was heavily calcified.  There were no significant obstructions in the ongoing diagonal branch or LAD.  The left circumflex artery and its branches were without significant obstruction.  It did give rise to three moderate-sized marginal branches; none had any significant obstruction.  The right coronary artery and its branches were moderately disease; this vessel had a proximal 30-40% stenosis and some calcification.  There were luminal irregularities throughout the mid and more distal portion.  No significant obstructions were seen  there.  The vessel did give rise to a large posterior descending artery and a moderate posterolateral segment and branch.   Following balloon dilatation and stent implantation, there was no residual stenosis in the stented portion of the vessel.  There was some residual stenosis in the proximal portion of the lesion amounting to perhaps 30-40% obstruction; however, by intravascular ultrasound, this was not a significant lesion.  As mentioned above, the minimal luminal diameter of the stented segment following high-pressure balloon inflation was 3.0 x 3.2 mm.  FINAL IMPRESSION 1. Arteriosclerotic cardiovascular disease, single vessel. 2. Status post successful percutaneous transluminal coronary angioplasty and    stent implantation, proximal left anterior descending artery. 3. Intact left ventricular size and global systolic function, with regional    wall motion abnormality, as noted. 4. Typical angina was reproduced with device insertion and balloon inflation. DD:  09/28/99 TD:  09/29/99 Job: [id number]

## 2010-06-26 NOTE — Op Note (Signed)
NAME:  Holly [name], Holly [name]                           ACCOUNT NO.:  [id number]   MEDICAL RECORD NO.:  [id number]                   PATIENT TYPE:  INP   LOCATION:  Z610                                 FACILITY:  [facility]   PHYSICIAN:  Leonides Grills, M.D.                  DATE OF BIRTH:  [birth year]   DATE OF PROCEDURE:  05/09/2002  DATE OF DISCHARGE:                                 OPERATIVE REPORT   PREOPERATIVE DIAGNOSES:  1. Left ankle arthritis.  2. Left tight Achilles tendon.  3. 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  4. Left tibial and talar spurs.   POSTOPERATIVE DIAGNOSES:  1. Left ankle arthritis.  2. Left tight Achilles tendon.  3. 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  4. Left tibial and talar spurs.   OPERATION:  1. Left ankle fusion.  2. Excision left tibial spurs.  3. Excision left talar spurs.  4. Left iliac crest bone graft.  5. Left lengthening calcaneal osteotomy.  6. Left medializing calcaneal osteotomy.  7. Left percutaneous tendo Achilles lengthening.  8. Left FDL to navicular transfer.  9. Excision left posterior tibial tendon.   ANESTHESIA:  General endotracheal tube.   SURGEON:  Sherri Rad, M.D.   ASSISTANT:  Lianne Cure, PA-C.   ESTIMATED BLOOD LOSS:  Minimal.   TOURNIQUET TIME:  Approximately two hours.   COMPLICATIONS:  None.   DISPOSITION:  Stable to PAR.   INDICATION:  This is a 77 year old female, who has had longstanding ankle  pain, both within the ankle as well as posterior medially.  She had severe  ankle arthritis with posterior tibial tendon insufficiency that has resisted  conservative management.  She is consented for the above procedures.  All  risks which include infection, neurovascular injury, nonunion, malunion,  hardware rotation, hardware failure, worsening of pain, stiffness,  arthritis, and possible future surgery were all explained.  Questions were  encouraged and answered.   DESCRIPTION  OF OPERATION:  The patient was brought to the operating room and  placed in supine a position.  After adequate general endotracheal tube  anesthesia was administered as well as Ancef 1 g IV piggyback, the left  lower extremity as well as iliac crest bone graft site were prepped and  draped in a sterile manner over a proximally-placed thigh tourniquet and  after a Foley catheter was placed.  We started the procedure with a  longitudinal incision over the left iliac crest bone graft site.  Dissection  was carried down through subcu.  Hemostasis was obtained.  Fascia was opened  in line with the incision over the crest graft site.  Soft tissues were  elevated along the inner and outer tables using an oscillating saw.  Approximately 1 cm of trapezoidal-shaped bone block was then obtained as  well as cancellous graft.  A small extra sliver of bone was also taken  for  the ankle fusion as well.  The wound was copiously irrigated with normal  saline.  Bone wax was placed deep within the wound and to line the iliac  crest graft site.  Deep soft tissues were closed with 0 Vicryl; subcu was  closed with 2-0 Vicryl; skin was closed with 4-0 Monocryl subcuticular  stitch; Steri-Strips were applied.  We then gravity-exsanguinated the left  lower extremity, and the tourniquet was elevated 290 mmHg.  A longitudinal  incision over the posterior tibial tendon was then made.  Dissection was  carried to the flexor retinaculum.  Hemostasis was obtained.  The flexor  retinaculum was opened in line with the incision.  Posterior tibial tendon  was identified and was severely diseased, and this was excised.  Once the  posterior tibial tendon was excised due to it being diseased, the FDL tendon  was identified, traced to the knot of Henry, and tenotomized in the wound  for later transfer to the navicular.  We then went on the lateral side of  the foot.  A longitudinal incision was made over the lateral malleolus.   Dissection was carried down to the fibula.  Soft tissues were elevated on  the anterior aspect of the fibula.  Syndesmosis was then opened.  There was  severe arthritis in this area.  Soft tissues were then elevated off the  anterior aspect of the tibia and talus, respectively.  With a quarter-inch  curved osteotome, the osteophytes were removed from both the distal tibia as  well as talus, respectively.  The ankle joint was then opened with a small  laminar spreader, and the remaining cartilage was then removed.  Multiple 2  mm drill holes were placed on either side of the joint, respectively, as  well as one in the lateral border of the tibia.  We then performed a  percutaneous tendo Achilles lengthening with two medial and one lateral hemi-  sections of the Achilles tendon.  This had excellent release of the tight  Achilles tendon.  We then positioned the talus in as much dorsiflexion as we  could and posteriorly translated within the ankle mortise.  This was then  provisionally fixed with K-wire.  We then made a longitudinal incision  midline over the tibia at the concavity medially.  Dissection was carried  down from skin to bone with snap.  Then a 4.5 drill was then drilled to the  talus, and then a 3.5 drill was drilled into the talus, respectively, under  C-arm guidance.  A 6.5, 16 mm threaded cancellous screw was then placed.  This had excellent compression across the osteotomy site.  This was verified  in C-arm guidance to be in proper position in the AP and lateral planes.  We  then went back to the lateral side of the ankle, removed the K-wire, and  then placed another 6.5 mm 16 mm threaded cancellous screw using a 4.5, 3.5  mm drill holes, respectively.  We then used the sagittal saw and in the  sagittal plane, removed the medial third of the fibula.  Once this was  removed, we then used the fibula's biological plate against the lateral talus and tibia, respectively, after this  was clamped with a two-point  reduction clamp.  Two 6.5 mm, 32 mm thread proximally and a 16 mm thread  distally were used, and this had excellent pressure across the lateral talus  and tibia, respectively.  Bone graft was placed between the fibula  biological plate and the lateral talar wall which was obtained from the  iliac crest bone graft site.  All bone graft and cancellous graft that was  removed from the iliac crest bone graft site as well as from the drills was  placed anteriorly within the ankle fusion region as well.  The wound was  copiously irrigated with normal saline.  We then extended the wound distally  over the anterior neck of the calcaneous towards the anterior cross of the  calcaneus.  Dissection was carried down to bone.  Soft tissue was elevated  on the superior and inferior of the neck of the calcaneous, respectively.  CC joint was then identified proximally and 1.2 cm proximal to this, an  osteotomy was made parallel to the CC joint, perpendicular to the lateral  wall of the calcaneus.  A curved quarter-inch osteotome was then placed, and  then a small laminar spreader was then placed.  The trapezoidal-shaped bone  blocks that were obtained from the iliac crest bone graft site was then  tamped into place, and this had an excellent lengthening of the lateral  calcaneus and restored the arch beautifully.  We then placed a 3.5 mm fully-  threaded cortical screw using a 2.5 mm drill hole, respectively from the  anterior across the calcaneus, across the graft site, into the proximal  portion of the calcaneus.  This had an excellent maintenance of the  correction.  We then made a longitudinal incision over the lateral calcaneal  tuber, perpendicular to the calcaneal pitch.  Dissection was carried down to  bone.  Soft tissues were elevated both on the superior and inferior aspects  of the calcaneal tuber, then using a 4000 saw, an osteotomy was made in the  calcaneal  tuber.  The calcaneus was then translated approximately 5-6 mm  medially, and this was provisionally fixed with a 2-0 K-wire.  Then a  longitudinal incision was made midline in the heel.  Two 6.5 mm 16 mm  threaded cancellous screws were then placed and using 4.5, 3.5 mm drill  holes, respectively.  This had excellent compression of the osteotomy.  Lateral axial views were obtained and showed excellent placement of the  fixation in both lengthening and medializing calcaneal osteotomy sites, as  well as the ankle fusion in the AP and lateral planes.  We then went back to  the medial aspect of the foot, and a drill hole was then placed in the  medial aspect of the navicular.  Once the drill hole was made with the 4.5  mm drill hole, a Hewson suture passer was then placed.  Then #2 Fibrewire  were placed in the end of the FDL tendon.  This was then pulled through the  drill hole and sewed to the posterior tibial stump and itself, respectively. The wound was copiously irrigated with normal saline.  Tourniquet was  deflated.  Hemostasis was obtained.  Palpable posterior tibia and dorsalis  pedis pulses were palpable.  Flexor retinaculum was closed with 2-0 Vicryl;  the subcu was closed with 3-0 Vicryl over all wounds.  Skin was closed with  4-0 nylon.  Over all wounds, a sterile dressing was applied.  Modified Jones  dressing was applied with the ankle in neutral dorsiflexion.  The patient  was stable to the PAR.  Leonides Grills, M.D.    PB/MEDQ  D:  05/09/2002  T:  05/09/2002  Job:  [id number]

## 2010-06-26 NOTE — Op Note (Signed)
[name], Holly [name] NO.:  [id number]   MEDICAL RECORD NO.:  [id number]          PATIENT TYPE:  AMB   LOCATION:  DSC                          FACILITY:  [facility]   PHYSICIAN:  Leonides Grills, M.D.     DATE OF BIRTH:  September 01, 1931   DATE OF PROCEDURE:  12/17/2003  DATE OF DISCHARGE:                                 OPERATIVE REPORT   PREOPERATIVE DIAGNOSIS:  Complication of hardware, right ankle, status post  ankle and subtalar joint fusion.   POSTOPERATIVE DIAGNOSIS:  Complication of hardware, right ankle, status post  ankle and subtalar joint fusion.   OPERATION:  1.  Hardware removal, deep, right ankle.  2.  Stress x-rays, right ankle.   ANESTHESIA:  General endotracheal tube.   SURGEON:  Leonides Grills, M.D.   ASSISTANT:  Lianne Cure, P.A.   ESTIMATED BLOOD LOSS:  Minimal.   TOURNIQUET:  None.   COMPLICATIONS:  None.   DISPOSITION:  Stable to PAR.   INDICATIONS:  This is a 77 year old female who underwent an ankle and  subtalar joint fusion at an outside institution and has had chronic pain in  her ankle.  Her screws were backing out and loose, and we wanted to remove  these screws to see if, in fact, they were contributing to her pain.  We  went over this in great detail today as well as the risks, which include  infection, nerve or vessel injury, persistent pain, worsening pain, and the  possibility of revision of the ankle and subtalar joint fusion, were all  explained, questions were encouraged and answered.   OPERATION:  The patient was brought to the operating room and placed in  supine position after general endotracheal tube anesthesia was administered  as well as Ancef 1 g IV piggyback.  The right lower extremity was then  prepped and draped in a sterile manner.  No tourniquet was used.  We made a  longitudinal incision through a previously incision.  Dissection was carried  down to screw.  We found the lateral screw.  This was grossly  loose to the  point where I was able to pull it out once dissection was taken down  directly to bone.  I was able to back this out with a pick-up and then pull  it out with my fingers. No threads were engaged in bone.  The screw was  intact, no evidence of hardware failure.  On the medial side, I had to use a  chisel to chisel out around the screw head, and then I was able to remove  the screw with a screwdriver, but again this was extremely loose and the  screw heads did not engage until approximately 1 cm of the screw was backed  out by pulling it back with a pick-up.  Stress x-rays were  obtained, showed no gross motion at that point.  The wounds were copiously  irrigated with normal saline and hemostasis was obtained.  The wound was  closed with 4-0 nylon suture, sterile dressing was applied, a hard-soled  shoe was applied.  The  patient was stable to the PAR.       PB/MEDQ  D:  12/17/2003  T:  12/17/2003  Job:  [id number]

## 2010-06-26 NOTE — Op Note (Signed)
NAME:  Holly [name], Holly [name]                           ACCOUNT NO.:  [id number]   MEDICAL RECORD NO.:  [id number]                   PATIENT TYPE:  INP   LOCATION:  NA                                   FACILITY:  [facility]   PHYSICIAN:  Claude Manges. [name], M.D.            DATE OF BIRTH:  04-12-1931   DATE OF PROCEDURE:  01/02/2002  DATE OF DISCHARGE:                                 OPERATIVE REPORT   PREOPERATIVE DIAGNOSIS:  Superior third left patella fracture with total  knee replacement.   POSTOPERATIVE DIAGNOSIS:  Superior third left patella fracture with total  knee replacement.   PROCEDURE:  Open reduction and internal fixation of patella fracture with  capsule repair.   SURGEON:  Claude Manges. [name], M.D.   ASSISTANT:  Jamelle Rushing, P.A.   ANESTHESIA:  General endotracheal anesthesia.   COMPLICATIONS:  None.   HISTORY:  The patient is two months status post uncomplicated left total  knee replacement.  She was seen in the office last week after a fall and  there was an avulsion of the superior pole of the patella associated with a  hemarthrosis.  At the time there was minimal displacement of the fracture  and she only lacked approximately 15 degrees of extension lag.  She was  treated with a knee immobilizer, but apparently she has fallen at least once  or twice more at home and now has evidence of displacement of at least an  inch or two of the patella fracture.  She is to have an open reduction and  internal fixation.   DESCRIPTION OF PROCEDURE:  With the patient comfortable on the operating  table and under general orotracheal anesthesia the left lower extremity was  placed in a thigh tourniquet.  The leg was then prepped with Duraprep and  had been draped with Betaine scrub in the preoperative area.  Sterile  draping was performed.  With the extremity still elevated it was Esmarch  exsanguinated with the proximal tourniquet at 350 mmHg.   The previous midline longitudinal  incision was partially utilized extending  from the superior pouch just below the patella via sharp dissection. An  incision was taken down to the subcutaneous tissue and abundant adipose  tissue to the first layer of capsule.  There was an obvious defect in the  deep capsule overlying the superior pole of the patella and unclotted joint  fluid exuded from the wound; this was cultured.  It appeared to be clear  without evidence of infection.  The wound was then further delineated.  The  subcutaneous tissue and deep adipose tissue was elevated off the capsule  both medially and laterally and self retaining retractors were inserted in  the superior pole of the patella.  About 25 to 30% of the patella was  displaced and retracted proximally with an inch and a half to two inches of  a gap  and there was tearing of the capsule both medially and laterally.  This was just developed a little bit further so that we could visualize the  joint.  There were clots within the joint which were irrigated with  antibiotic solution.  A portion of the patella was uncovered.  It had been  previously cemented, but there was no bone attached to it.  It probably  represented approximately a third, but it seemed to very well seated and was  not the least bit loose, so we elected to leave it in place and then repair  the patella fracture.  The fracture site was ragged.  I debrided the edges  and then I was able to approximate them with what I felt was an anatomic  position.  Two #5 fiber wires were then placed through bone proximally and  then through bone distally through drill holes and I was able to approximate  the fracture anatomically.  I did take some small croutons of cancellous  bone allograft and place it in the fracture site in an attempt to promote  bone to bone healing.  I had a very attachment that appeared to be stable,  but to further supplement the fracture I inserted a circumferential 18 gauge   surgical stainless steel wire with a tension band, tightened it laterally  and then bent the wire knot distally into the deep capsule. This provided  excellent secondary support.  As I placed the knee to flexion at least 45  degrees, there was no motion whatsoever with the fracture site well within  the soft tissue.  The wound was again irrigated with antibiotic solution and  saline solution.  The deep capsule was closed anatomically with 0 Tycron  suture.  The superficial capsule was closed with interrupted 0 Vicryl.  The  subcutaneous was closed in several layers with 0 and 2-0 Vicryl.  The skin  was closed with skin clips. A sterile bulky dressing was applied. The  tourniquet was deflated at the initiation of superficial capsule closure.   The patient tolerated the procedure without complications.                                               Claude Manges. [name], M.D.    PWW/MEDQ  D:  01/02/2002  T:  01/02/2002  Job:  [id number]

## 2010-06-26 NOTE — Op Note (Signed)
[facility]  Patient:    Holly [name], Holly [name]                        MRN: [id number] Proc. Date: 12/08/99 Adm. Date:  [id number] Attending:  Jenean Lindau CC:         Francisca December, M.D.   Operative Report  PREOPERATIVE DIAGNOSIS:  Postmenopausal bleeding, rule out polyp.  POSTOPERATIVE DIAGNOSIS:  Postmenopausal bleeding, rule out polyp.  PROCEDURE:  Hysteroscopic resection with dilatation and curettage.  SURGEON:  Laqueta Linden, M.D.  ANESTHESIA:  MAC sedation with paracervical block.  ESTIMATED BLOOD LOSS:  Less than 20 cc.  NET SORBITOL INTAKE:  200 cc.  COMPLICATIONS:  None, concern regarding possible perforation due to the marked irregularity of the endometrial cavity; however, there was never any change in intrauterine pressure nor was there any rapid increase in sorbitol intake to suggest perforation.  INDICATIONS:  Holly [name] is a 77 year old white female with multiple medical problems including cardiac disease, status post recent stent placement.  She is on continuous combined hormone replacement therapy.  She presented in September reporting a two month history of intermittent spotting.  She underwent pelvic ultrasound with sonohysterogram revealing a 6.6 mm endometrial stripe and an 8 x 6 x 6 mm echogenic focus on the posterior endometrial surface suspicious for a polyp.  She is therefore to undergo hysteroscopic evaluation and resection as indicated.  She has been extensively counseled as to the risks, benefits, alternatives, and complications, including the risk of uterine perforation, excessive sorbitol intake with resultant problems due to her cardiac status, infection, or bleeding and agrees to proceed.  She had seen the informed consent film, voiced her understanding, and had her questions answered.  She was then cleared for surgery by Dr. Corliss Marcus, her cardiologist.  DESCRIPTION OF PROCEDURE:  The patient was taken to  the operating room and after proper identification and consents were ascertained, she was placed on the operating table in the supine position.  After light IV sedation (per the patients request) was accomplished, she was placed in the Dexter stirrups, and the perineum and vagina were prepped and draped in a routine sterile fashion. A transurethral Foley was placed.  This was removed at the conclusion of the procedure.  Bimanual examination confirmed an anterior small uterus deviated somewhat to the patients right.  The large speculum was placed in the vagina and the cervix was grasped with a single-tooth tenaculum.  The paracervical block was placed utilizing 10 cc of 1% plain Xylocaine circumferentially.  The internal os was gently dilated to a #33 Pratt dilator without difficulty.  The patient tolerated this with a moderate degree of cramping.  The resectoscope was then inserted under direct vision.  The endocervical canal and lower uterine segment was free of lesions.  There was a hemorrhagic appearing area on the posterior wall which appeared to be bleeding somewhat, and this was felt to be possibly related to trauma from the dilation.  It was felt that this could possibly represent a flat polyp that had been traumatized by the dilator.  The fundus was visualized, but it was noted to be very difficult to distend the uterus even with pressure of 100 felt due to the patients morbid obesity.  Both tubal ostia were felt to be visualized.  There were multiple irregularities in the cavity felt to be consistent with possibly calcified fibroids.  The only focal lesion visualized was this posterior  wall lesion. Sharp curettage was performed with a minimal amount of tissue obtained.  The hysteroscope was then reinserted, and then some improved visualization was noted.  The resectoscope was passed twice over this area with two small pieces of tissue removed.  At this point, the patient was moving  somewhat and becoming uncomfortable.  Due to the difficulty in distending the uterus and adequately visualizing, it was felt appropriate to discontinue the resection at this time.  The operator was concerned that a perforation might have occurred, although there was no objective evidence to suggest this.  The pressure was maintained at 100 throughout the procedure with no acute drop in pressure and never any rapid increase in cervical intake noted.  There was no perforation actually identified, but there was subjective concern about this. In any event, it was felt appropriate to terminate the procedure at this time and that the endometrial cavity had been adequately sampled and that the posterior lesion in question had been resected.  The resectoscope was then removed.  The tenaculum was then removed.  There was no active bleeding from the cervix.  Net sorbitol intake was felt to be 200 cc.  Estimated blood loss was less than 20 cc.  The patient was stable on transfer to the recovery room. She will be observed for an additional amount of time as deemed appropriate per anesthesia and PACU due to her cardiac history and the above-noted concerns.  She was given detailed discharge instructions and sent home on routine medications and Advil or Aleve.  She is to follow up in the office in 4-6 weeks time or sooner for excessive pain, fever, bleeding, nausea, vomiting, or other concerns. DD:  12/08/99 TD:  12/08/99 Job: [id number]

## 2010-06-26 NOTE — H&P (Signed)
NAME:  Holly [name], Holly [name]                           ACCOUNT NO.:  [id number]   MEDICAL RECORD NO.:  [id number]                   PATIENT TYPE:  INP   LOCATION:  NA                                   FACILITY:  [facility]   PHYSICIAN:  Claude Manges. [name], M.D.            DATE OF BIRTH:  November 04, 1931   DATE OF ADMISSION:  DATE OF DISCHARGE:                                HISTORY & PHYSICAL   CHIEF COMPLAINT:  Left knee pain.   HISTORY OF PRESENT ILLNESS:  The patient is a 77 year old white female with  a history of left  total knee arthroplasty in September 2003. The patient  was  doing very well and had gone home from her outpatient skilled nursing  facility rehabilitation facility to her home and then started developing  some discomfort in  her left distal thigh. The patient had significant  increased discomfort. She came in to have it evaluated by Dr. [name] and  was found to have a quad tendon rupture with partial patellar fracture.  Her  family physician is Dr. Henrine Screws.  Her cardiologist is Dr. Amil Amen.   ALLERGIES:  1. PLAVIX.  2. SULFA.  3. SENSITIVE TO ADHESIVE TAPE.   PAST MEDICAL HISTORY:  1. Type 2 diabetes mellitus, diet controlled.  2. Heart disease with stent in 2001.  3. Fibromyalgia.  4. History of hypertension.  5. History of lower back problems.   PAST SURGICAL HISTORY:  1. Tonsillectomy.  2. Appendectomy.  3. Tubal ligation.  4. Left carpal tunnel release.  5. Right ankle fusion.  6. Left trigger finger and thumb.  7. Left rotator cuff repair.  8. Right  total knee arthroplasty.  9. Cardiac stent placement.  10.      Left  total knee arthroplasty.  11.      The patient denies any complications with the above mentioned     surgical procedures.   CURRENT MEDICATIONS:  1. Plaquenil 200 mg p.o. b.i.d.  2. Tenormin 50 mg  p.o. q.d.  3. Niaspan 2000 p.o. q.h.s.  4. Zoloft 100 mg p.o. q.d.  5. Celebrex 200 mg p.o. b.i.d.  6. Clonazepam 1 mg p.o. q.h.s.  7. Multivitamins 1 tablet p.o. q.d.  8. Ecotrin 325 mg p.o. q.d.  9. Vitamin C 500 mg p.o. q.d.  10.      Lidoderm patches p.r.n. pain.   SOCIAL HISTORY:  The patient is a retired [employer] loss adjuster. She is widowed. She  smoked about one pack a day; stopped 15 years previous. Denies any alcohol  use. She denies any alcohol use over the last 20 years. She denies drug use.  She currently lives in a one story structure.   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 She does wear glasses. She has upper and lower partial dentures.  She does have occasional shortness of breath with exertion related to  condition and heart disease. She does take one Imodium a day due to  diarrhea.   PHYSICAL EXAMINATION:  VITAL SIGNS:  Respirations 18, pulse 64 and regular,  temperature not checked, blood pressure 152/68.  GENERAL:  This is a healthy appearing, well developed markedly obese white  female who ambulates with the use of a cane with her left knee in a long  right knee immobilizer.  HEENT:  Normocephalic, atraumatic.  Pupils equally round and reactive,  accommodating to light. External ears without deformities. Canals patent.  Gross hearing is intact. Oral mucosa was pink and moist without lesions.  Upper and lower partial plates in place.  NECK:  Supple, no palpable lymphadenopathy, good range of motion.  LUNGS:  Clear and equal bilaterally.  HEART:  Regular rate and rhythm.  ABDOMEN:  Round, soft, obese, normoactive bowel sounds  throughout.  EXTREMITIES:  Upper extremities symmetric in size and shape. Excellent range  of motion in all joints. Lower extremities were not evaluated due to the  patient's being in a wheelchair and the left lower extremity in  a long-limb  brace.  PERIPHERAL VASCULATURE:  Carotid pulses 2+, no bruits. Radial pulses 2+.  Dorsalis pedis and posterior tibial pulses were  1+, no lower extremity edema.  NEUROLOGIC:  The patient was  conscious, alert and appropriate. Held easy  conversation with the examiner. Cranial nerves II through XII grossly  intact. The patient was grossly intact to light touch sensation from head to  toe. The patient had no gross neurological deficits.  BREASTS, RECTAL, GU:  Examinations were deferred.   IMPRESSION:  1. Ruptured left quad tendon with a patellar fracture.  2. Diet controlled diabetes.  3. Coronary artery disease with stent.  4. Hypertension.  5. History of fibromyalgia.  6. Chronic lower back problems.    PLAN:  The patient will be admitted to [facility] on January 02, 2002, under the care of Dr. Norlene Campbell. The patient will undergo all  routine  labs and tests prior to  having  a left knee quad tendon and  patellar fracture repair performed. Dr. Amil Amen' office has been contacted  for any recommendations prior to  surgery.  The patient will otherwise go  ahead with plans as anticipated.        Jamelle Rushing, P.A.                      Claude Manges. [name], M.D.    RWK/MEDQ  D:  01/01/2002  T:  01/01/2002  Job:  [id number]

## 2010-06-26 NOTE — H&P (Signed)
NAME:  Holly [name], Holly [name]                           ACCOUNT NO.:  [id number]   MEDICAL RECORD NO.:  [id number]                   PATIENT TYPE:  INP   LOCATION:  NA                                   FACILITY:  [facility]   PHYSICIAN:  Claude Manges. [name], M.D.            DATE OF BIRTH:  20-Oct-1931   DATE OF ADMISSION:  11/02/2001  DATE OF DISCHARGE:                                HISTORY & PHYSICAL   CHIEF COMPLAINT:  Right knee pain.   HISTORY OF PRESENT ILLNESS:  The patient is a 77 year old white female with  a history of left knee pain over the last three years. The pain has  progressively worsened with time. It worsens with the amount of activity,  and it has strictly limited the patient's activities of daily living. The  patient has significant pain which does interrupt sleep. The patient is  constant. It is an aching sensation that does not radiate. It increases with  any type of weight bearing activity. She denies any significant mechanical  symptoms. She does have swelling, and she is currently using a cane to  assist ambulation.   X-rays revealed severe osteoarthritis, left knee.   ALLERGIES:  1. Plavix.  2. Sulfa.  3. Sensitive to adhesive tape.   PAST MEDICAL HISTORY:  1. Diabetes, diet controlled.  2. Heart disease with a stent placement in 2001. The patient denied any     cardiac symptoms, but problems were noted on a routine stress test.  3. Fibromyalgia.  4. History of hypertension.  5. History of lower back problems requiring epidurals.   PAST SURGICAL HISTORY:  1. Tonsillectomy in 1937.  2. Appendectomy in 1960.  3. Tubal ligation in 1972.  4. Left carpal tunnel release in 1979.  5. Right ankle fusion in 2000.  6. Left trigger finger of thumb in 1998.  7. Left rotator cuff repair in 1998.  8. Right total knee arthroplasty in 2000.  9. Cardiac stent placement in 2001.   The patient denies any significant complications from the above mentioned  surgical  procedures.   FAMILY HISTORY:  Mother is deceased, had multiple medical issues including  pernicious anemia, rheumatoid arthritis, hypertension. Father is deceased  with complications due to diabetes. The patient has two sisters living with  history of heart disease and diabetes.   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  History of one pack a day smoking, stopped 15 years previous. Denies any  alcoholic beverage over the last 20 years. Denies drug abuse. The patient  lives in a one story structure.   FAMILY PHYSICIAN:  Dr. Molly Maduro __________.   CURRENT MEDICATIONS:  1. Zoloft 100 mg p.o. q.d.  2. Plaquenil 200 mg p.o. q.d.  3. Celebrex 200 mg p.o. b.i.d.  4. Atenolol 50 mg p.o. q.d.  5. Niaspan 100 mg p.o. q.d.  6. Clonazepam 1 mg  p.o. q.h.s.  7. Ecotrin 325 mg p.o. q.d.  8. Darvocet p.r.n.  9. Lidoderm patch 5% p.r.n.  10.      Vitamin C 500 mg p.o. q.d.  11.      Magnesium one tablet p.o. q.d.  12.      Multivitamin one tablet p.o. q.d.  13.      Co-enzyme Q-10 one tablet p.o. q.d.   REVIEW OF SYSTEMS:  Positive for blindness in her right eye due to vascular  occlusion. She does wear glasses. Tenderness in bilateral ears. She does  have upper and lower partial dentures. The patient does have shortness of  breath with exertion which she relates to conditioning in her size. The  patient has had a cardiac surgery for stent placement. Denies any cardiac  symptoms. The patient does have significant problems with nausea and  diarrhea and constipation, currently uses half a pack of Imodium a day.  Increased urinary frequency, no other signs or symptoms of infection. Denies  any history of seizures. She does have a history of depression which is well  controlled with the Zoloft.   PHYSICAL EXAMINATION:  VITALS:  Respirations 20, pulse 60 and regular,  temperature 97.8, blood pressure 124/67. Height is 5'2, weight is 230  pounds.  GENERAL:  This  is a healthy appearing, well-developed, morbid obese, white  female who ambulates with a cane in her left hand. She ambulates very  slowly. She is unable to get onto the exam table due to her size and  difficulty with her knee. She is unable to get out of a chair without  assistance.  HEENT:  Head was normocephalic, atraumatic, nontender over maxillary and  fundal sinuses. Pupils are equal, round, and reactive and accommodating to  light. Extraocular movements intact. Sclerae is nonicteric. Conjunctivae is  pink and moist. External ears without deformities. Gross hearing was intact.  Nasal septum was midline. Oral buccal mucosa was pink and moist without  lesions. Upper and lower partial plates were in place.  NECK:  Supple, no palpable lymphadenopathy. The patient had good range of  motion of her cervical spine without any difficulty.  LUNGS:  Clear and equal bilaterally. No wheezes, rales, rhonchi, or rubs  noted.  HEART:  Regular, rate, and rhythm, S1 and S2 is auscultated. No murmurs,  rubs, or gallops noted.  ABDOMEN:  Round, soft, nontender, obese.  EXTREMITIES:  Upper extremities were symmetric in size and shape. She had  5/5 motor strength in all muscle groups tested. She has excellent range of  motion of all joints. Lower extremities:  The patient had excellent range of  motion of her bilateral hips, full extension up to 100 degrees, limited by  abdominal and body girth. No discomfort with internal or external rotation.  Left lower extremity:  She had moderate amount of obesity in the muscles.  The knee was slightly centered along the medial and lateral joint line. She  had about 5 degrees short of full extension and flexion back to 100 degrees.  No significant valgus or varus laxity. No signs of erythema or ecchymosis.  Calf was nontender. Right knee had a well healed midline surgical incision. Range of motion was 5 degrees short of full extension back to 100 degrees.  No  significant instability. Calf was nontender. Ankles were symmetrical with  good dorsoplantar flexion.  VASCULATURE:  Radial, carotid, and dorsalis pedis pulses were intact. She  had no carotid bruits. No JVD noted.  NEUROLOGICAL:  Cranial  nerves were intact, slightly decreased sensation was  noted in the tips of biceps, triceps, brachial radius, patella, and Babinski  were 1+. The patient was grossly intact to light touch and sensation.  RECTAL/GYNECOLOGICAL:  Rectal and GYN exams were deferred at this time.   IMPRESSION:  1. End-stage osteoarthritis, left knee.  2. Obesity.  3. Hypertension.  4. Diabetes, diet controlled.  5. Fibromyalgia.   PLAN:  The patient will undergo all routine labs and tests prior to having a  left total knee arthroplasty at [facility] on 11/02/01.     Jamelle Rushing, P.A.                      Claude Manges. [name], M.D.    RWK/MEDQ  D:  10/24/2001  T:  10/24/2001  Job:  [id number]

## 2010-06-26 NOTE — Op Note (Signed)
   NAME:  Holly [name], Holly [name]                           ACCOUNT NO.:  [id number]   MEDICAL RECORD NO.:  [id number]                   PATIENT TYPE:  AMB   LOCATION:  DSC                                  FACILITY:  [facility]   PHYSICIAN:  Leonides Grills, M.D.                  DATE OF BIRTH:  02-Mar-1931   DATE OF PROCEDURE:  09/18/2002  DATE OF DISCHARGE:                                 OPERATIVE REPORT   PREOPERATIVE DIAGNOSIS:  Complication of hardware, left foot, status post  ankle hindfoot reconstruction.   POSTOPERATIVE DIAGNOSIS:  Complication of hardware, left foot, status post  ankle hindfoot reconstruction.   OPERATION PERFORMED:  Hardware removal, deep left foot.   SURGEON:  Leonides Grills, M.D.   ASSISTANT:  Lianne Cure, P.A.   ANESTHESIA:  General endotracheal tube.   ESTIMATED BLOOD LOSS:  Minimal.   TOURNIQUET TIME:  None.   COMPLICATIONS:  None.   DISPOSITION:  Stable to PR.   INDICATIONS FOR PROCEDURE:  The patient is a 77 year old female who is  status post ankle hindfoot reconstruction and had painful hardware in her  heel.  The patient  has consented for the above procedure.  All risks which  include infection, neurovascular injury, persistent pain, worsening pain,  were all explained, questions were encouraged and answered.   DESCRIPTION OF PROCEDURE:  The patient was brought to the operating room and  placed in supine position after adequate general endotracheal tube  anesthesia was administered as well as Ancef 1g IV piggyback.  The left  lower extremity was then prepped and draped in sterile manner.  A  longitudinal incision through the previous incision was then made down  through screw heads and bone.  Screws were then removed with large fragment  screwdriver.  Both screws were removed without difficulty.  Both screws were  intact.  No evidence of hardware failure.  The wound was then copiously  irrigated with normal saline.  The skin was closed with 4-0  nylon.  Sterile  dressing was applied.  Hard sole shoe was applied.  The patient was stable  to the PR.                                                 Leonides Grills, M.D.    PB/MEDQ  D:  09/18/2002  T:  09/18/2002  Job:  [id number]

## 2010-08-19 ENCOUNTER — Emergency Department (HOSPITAL_COMMUNITY)
Admission: EM | Admit: 2010-08-19 | Discharge: 2010-08-19 | Disposition: A | Discharge status: Home / Self Care | Payer: Medicare Other | Attending: Emergency Medicine | Admitting: Emergency Medicine

## 2010-08-19 ENCOUNTER — Emergency Department (HOSPITAL_COMMUNITY): Payer: Medicare Other

## 2010-08-19 DIAGNOSIS — N390 Urinary tract infection, site not specified: Secondary | ICD-10-CM | POA: Insufficient documentation

## 2010-08-19 DIAGNOSIS — E119 Type 2 diabetes mellitus without complications: Secondary | ICD-10-CM | POA: Insufficient documentation

## 2010-08-19 DIAGNOSIS — I10 Essential (primary) hypertension: Secondary | ICD-10-CM | POA: Insufficient documentation

## 2010-08-19 DIAGNOSIS — Z96659 Presence of unspecified artificial knee joint: Secondary | ICD-10-CM | POA: Insufficient documentation

## 2010-08-19 DIAGNOSIS — R110 Nausea: Secondary | ICD-10-CM | POA: Insufficient documentation

## 2010-08-19 DIAGNOSIS — R42 Dizziness and giddiness: Secondary | ICD-10-CM | POA: Insufficient documentation

## 2010-08-19 DIAGNOSIS — I2510 Atherosclerotic heart disease of native coronary artery without angina pectoris: Secondary | ICD-10-CM | POA: Insufficient documentation

## 2010-08-19 DIAGNOSIS — R5381 Other malaise: Secondary | ICD-10-CM | POA: Insufficient documentation

## 2010-08-19 DIAGNOSIS — R5383 Other fatigue: Secondary | ICD-10-CM | POA: Insufficient documentation

## 2010-08-19 LAB — URINALYSIS, ROUTINE W REFLEX MICROSCOPIC
Bilirubin Urine: NEGATIVE
Glucose, UA: NEGATIVE mg/dL
Nitrite: POSITIVE — AB
Specific Gravity, Urine: 1.014 (ref 1.005–1.030)
pH: 7.5 (ref 5.0–8.0)

## 2010-08-19 LAB — COMPREHENSIVE METABOLIC PANEL
ALT: 21 U/L (ref 0–35)
AST: 36 U/L (ref 0–37)
Albumin: 4 g/dL (ref 3.5–5.2)
Calcium: 10.1 mg/dL (ref 8.4–10.5)
GFR calc Af Amer: 45 mL/min — ABNORMAL LOW (ref >60)
Glucose, Bld: 128 mg/dL — ABNORMAL HIGH (ref 70–99)
Sodium: 139 mEq/L (ref 135–145)
Total Protein: 7.4 g/dL (ref 6.0–8.3)

## 2010-08-19 LAB — DIFFERENTIAL
Basophils Absolute: 0 10*3/uL (ref 0.0–0.1)
Basophils Relative: 0 % (ref 0–1)
Monocytes Absolute: 0.5 10*3/uL (ref 0.1–1.0)
Neutro Abs: 4.5 10*3/uL (ref 1.7–7.7)

## 2010-08-19 LAB — URINE MICROSCOPIC-ADD ON

## 2010-08-19 LAB — CBC
MCHC: 32.2 g/dL (ref 30.0–36.0)
Platelets: 164 10*3/uL (ref 150–400)
RDW: 15.2 % (ref 11.5–15.5)

## 2010-08-19 LAB — TROPONIN I: Troponin I: <0.3 ng/mL (ref <0.30)

## 2010-08-21 LAB — URINE CULTURE: Colony Count: >=100000

## 2010-10-15 ENCOUNTER — Emergency Department (HOSPITAL_COMMUNITY)
Admission: EM | Admit: 2010-10-15 | Discharge: 2010-10-15 | Disposition: A | Discharge status: Home / Self Care | Payer: Medicare Other | Attending: Emergency Medicine | Admitting: Emergency Medicine

## 2010-10-15 DIAGNOSIS — M4120 Other idiopathic scoliosis, site unspecified: Secondary | ICD-10-CM | POA: Insufficient documentation

## 2010-10-15 DIAGNOSIS — M545 Low back pain, unspecified: Secondary | ICD-10-CM | POA: Insufficient documentation

## 2010-10-15 DIAGNOSIS — I10 Essential (primary) hypertension: Secondary | ICD-10-CM | POA: Insufficient documentation

## 2010-10-15 DIAGNOSIS — I252 Old myocardial infarction: Secondary | ICD-10-CM | POA: Insufficient documentation

## 2010-10-15 DIAGNOSIS — B029 Zoster without complications: Secondary | ICD-10-CM | POA: Insufficient documentation

## 2010-10-15 DIAGNOSIS — E119 Type 2 diabetes mellitus without complications: Secondary | ICD-10-CM | POA: Insufficient documentation

## 2010-10-16 ENCOUNTER — Emergency Department (HOSPITAL_COMMUNITY): Payer: Medicare Other

## 2010-10-16 ENCOUNTER — Inpatient Hospital Stay (HOSPITAL_COMMUNITY)
Admission: EM | Admit: 2010-10-16 | Discharge: 2010-10-23 | DRG: 552 | Disposition: A | Discharge status: Skilled Nursing Facility | Payer: Medicare Other | Attending: Internal Medicine | Admitting: Internal Medicine

## 2010-10-16 DIAGNOSIS — N390 Urinary tract infection, site not specified: Secondary | ICD-10-CM | POA: Diagnosis present

## 2010-10-16 DIAGNOSIS — R112 Nausea with vomiting, unspecified: Secondary | ICD-10-CM | POA: Diagnosis present

## 2010-10-16 DIAGNOSIS — M4120 Other idiopathic scoliosis, site unspecified: Secondary | ICD-10-CM | POA: Diagnosis present

## 2010-10-16 DIAGNOSIS — A498 Other bacterial infections of unspecified site: Secondary | ICD-10-CM | POA: Diagnosis present

## 2010-10-16 DIAGNOSIS — M130 Polyarthritis, unspecified: Secondary | ICD-10-CM | POA: Diagnosis present

## 2010-10-16 DIAGNOSIS — A5211 Tabes dorsalis: Secondary | ICD-10-CM | POA: Diagnosis present

## 2010-10-16 DIAGNOSIS — H5440 Blindness, one eye, unspecified eye: Secondary | ICD-10-CM | POA: Diagnosis present

## 2010-10-16 DIAGNOSIS — L270 Generalized skin eruption due to drugs and medicaments taken internally: Secondary | ICD-10-CM | POA: Diagnosis not present

## 2010-10-16 DIAGNOSIS — Z9861 Coronary angioplasty status: Secondary | ICD-10-CM

## 2010-10-16 DIAGNOSIS — I5030 Unspecified diastolic (congestive) heart failure: Secondary | ICD-10-CM | POA: Diagnosis not present

## 2010-10-16 DIAGNOSIS — G8929 Other chronic pain: Secondary | ICD-10-CM | POA: Diagnosis present

## 2010-10-16 DIAGNOSIS — Z96659 Presence of unspecified artificial knee joint: Secondary | ICD-10-CM

## 2010-10-16 DIAGNOSIS — E860 Dehydration: Secondary | ICD-10-CM | POA: Diagnosis present

## 2010-10-16 DIAGNOSIS — R5381 Other malaise: Secondary | ICD-10-CM | POA: Diagnosis present

## 2010-10-16 DIAGNOSIS — Z993 Dependence on wheelchair: Secondary | ICD-10-CM

## 2010-10-16 DIAGNOSIS — Z87440 Personal history of urinary (tract) infections: Secondary | ICD-10-CM

## 2010-10-16 DIAGNOSIS — R0902 Hypoxemia: Secondary | ICD-10-CM | POA: Diagnosis present

## 2010-10-16 DIAGNOSIS — T481X5A Adverse effect of skeletal muscle relaxants [neuromuscular blocking agents], initial encounter: Secondary | ICD-10-CM | POA: Diagnosis not present

## 2010-10-16 DIAGNOSIS — D696 Thrombocytopenia, unspecified: Secondary | ICD-10-CM | POA: Diagnosis present

## 2010-10-16 DIAGNOSIS — M48061 Spinal stenosis, lumbar region without neurogenic claudication: Principal | ICD-10-CM | POA: Diagnosis present

## 2010-10-16 DIAGNOSIS — I10 Essential (primary) hypertension: Secondary | ICD-10-CM | POA: Diagnosis present

## 2010-10-16 DIAGNOSIS — E662 Morbid (severe) obesity with alveolar hypoventilation: Secondary | ICD-10-CM | POA: Diagnosis present

## 2010-10-16 DIAGNOSIS — I2510 Atherosclerotic heart disease of native coronary artery without angina pectoris: Secondary | ICD-10-CM | POA: Diagnosis present

## 2010-10-16 DIAGNOSIS — E785 Hyperlipidemia, unspecified: Secondary | ICD-10-CM | POA: Diagnosis present

## 2010-10-16 DIAGNOSIS — I509 Heart failure, unspecified: Secondary | ICD-10-CM | POA: Diagnosis present

## 2010-10-16 DIAGNOSIS — Z91041 Radiographic dye allergy status: Secondary | ICD-10-CM

## 2010-10-16 DIAGNOSIS — I252 Old myocardial infarction: Secondary | ICD-10-CM

## 2010-10-16 DIAGNOSIS — B0089 Other herpesviral infection: Secondary | ICD-10-CM | POA: Diagnosis present

## 2010-10-16 DIAGNOSIS — Z79899 Other long term (current) drug therapy: Secondary | ICD-10-CM

## 2010-10-16 DIAGNOSIS — E119 Type 2 diabetes mellitus without complications: Secondary | ICD-10-CM | POA: Diagnosis present

## 2010-10-16 DIAGNOSIS — G4733 Obstructive sleep apnea (adult) (pediatric): Secondary | ICD-10-CM | POA: Diagnosis present

## 2010-10-16 LAB — URINE MICROSCOPIC-ADD ON

## 2010-10-16 LAB — COMPREHENSIVE METABOLIC PANEL
CO2: 24 mEq/L (ref 19–32)
Calcium: 9.8 mg/dL (ref 8.4–10.5)
Creatinine, Ser: 1.37 mg/dL — ABNORMAL HIGH (ref 0.50–1.10)
GFR calc Af Amer: 45 mL/min — ABNORMAL LOW (ref >60)
GFR calc non Af Amer: 37 mL/min — ABNORMAL LOW (ref >60)
Glucose, Bld: 114 mg/dL — ABNORMAL HIGH (ref 70–99)

## 2010-10-16 LAB — CBC
Hemoglobin: 10.5 g/dL — ABNORMAL LOW (ref 12.0–15.0)
MCH: 32.3 pg (ref 26.0–34.0)
MCV: 95.4 fL (ref 78.0–100.0)
RBC: 3.25 MIL/uL — ABNORMAL LOW (ref 3.87–5.11)

## 2010-10-16 LAB — URINALYSIS, ROUTINE W REFLEX MICROSCOPIC
Bilirubin Urine: NEGATIVE
Nitrite: POSITIVE — AB
Specific Gravity, Urine: 1.013 (ref 1.005–1.030)
Urobilinogen, UA: 0.2 mg/dL (ref 0.0–1.0)

## 2010-10-16 LAB — DIFFERENTIAL
Lymphs Abs: 1.2 10*3/uL (ref 0.7–4.0)
Monocytes Relative: 9 % (ref 3–12)
Neutro Abs: 6.7 10*3/uL (ref 1.7–7.7)
Neutrophils Relative %: 77 % (ref 43–77)

## 2010-10-17 LAB — BASIC METABOLIC PANEL
CO2: 23 mEq/L (ref 19–32)
Calcium: 9.2 mg/dL (ref 8.4–10.5)
Chloride: 104 mEq/L (ref 96–112)
Glucose, Bld: 86 mg/dL (ref 70–99)
Sodium: 138 mEq/L (ref 135–145)

## 2010-10-17 LAB — CBC
Hemoglobin: 9.5 g/dL — ABNORMAL LOW (ref 12.0–15.0)
MCH: 31 pg (ref 26.0–34.0)
RBC: 3.06 MIL/uL — ABNORMAL LOW (ref 3.87–5.11)
WBC: 8.2 10*3/uL (ref 4.0–10.5)

## 2010-10-17 LAB — GLUCOSE, CAPILLARY: Glucose-Capillary: 92 mg/dL (ref 70–99)

## 2010-10-18 LAB — URINE CULTURE: Culture  Setup Time: 201209080056

## 2010-10-18 LAB — CBC
MCH: 32.1 pg (ref 26.0–34.0)
Platelets: 123 10*3/uL — ABNORMAL LOW (ref 150–400)
RBC: 2.87 MIL/uL — ABNORMAL LOW (ref 3.87–5.11)
WBC: 7.5 10*3/uL (ref 4.0–10.5)

## 2010-10-18 LAB — COMPREHENSIVE METABOLIC PANEL
ALT: 12 U/L (ref 0–35)
AST: 20 U/L (ref 0–37)
CO2: 24 mEq/L (ref 19–32)
Calcium: 8.6 mg/dL (ref 8.4–10.5)
Chloride: 106 mEq/L (ref 96–112)
GFR calc non Af Amer: 57 mL/min — ABNORMAL LOW (ref >60)
Potassium: 4 mEq/L (ref 3.5–5.1)
Sodium: 139 mEq/L (ref 135–145)

## 2010-10-18 LAB — GLUCOSE, CAPILLARY: Glucose-Capillary: 121 mg/dL — ABNORMAL HIGH (ref 70–99)

## 2010-10-18 LAB — DIFFERENTIAL
Basophils Absolute: 0 10*3/uL (ref 0.0–0.1)
Basophils Relative: 0 % (ref 0–1)
Eosinophils Absolute: 0.3 10*3/uL (ref 0.0–0.7)
Neutrophils Relative %: 60 % (ref 43–77)

## 2010-10-18 NOTE — Progress Notes (Signed)
Holly [name], Holly [name] NO.:  [id number]  MEDICAL RECORD NO.:  [id number]  LOCATION:  1520                         FACILITY:  [facility]  PHYSICIAN:  Talmage Nap, MD  DATE OF BIRTH:  [birth year]                                PROGRESS NOTE   PRIMARY CARE PHYSICIAN: Dr. Juluis Rainier at [place].  PRIMARY ORTHOPEDIC SURGEON: 1. Dr. Nadara Mustard. 2. Dr. Alvester Morin.  CONSULTANT INVOLVED IN THE CASE: Orthopedics Surgery, Dr. Doneen Poisson.  DIAGNOSES: 1. Chronic back pain secondary to spinal stenosis. 2. Herpes zoster. 3. Herpetic neuralgia. 4. Urinary tract infection. 5. Flexural dermatitis. 6. Morbid obesity. 7. Obstructive sleep apnea. 8. Hypertension. 9. Coronary artery disease, status post stent. 10.Charcot joint. 11.Scoliosis. 12.Severe arthritis of the knee, status post knee replacement. 13.Deconditioning. 14.Diabetes mellitus.  HISTORY OF PRESENT ILLNESS: The patient is a 77 year old Caucasian female with history of severe spinal stenosis with periodic epidural steroid injection to the back, was admitted to the hospital on October 16, 2010 by Dr. Calvert Cantor with complaints of back pain that was said to be getting intractable. Baseline, the patient is wheelchair-bound and since the onset of the back pain, she is being having extreme difficulty in transporting her back and forth to the wheelchair.  She also complained about dizzy spell, but denied any associated vomiting.  She denied any fever.  No chills.  No rigor and subsequently, the patient was admitted for control of the chronic back pain.  PREADMISSION MEDICATIONS: Not documented in the initial history and physical.  ALLERGIES: Allergies to, 1. ADHESIVE TAPE. 2. SULFA. 3. PLAVIX. 4. IV CONTRAST.  SOCIAL HISTORY: Negative for alcohol or tobacco use.  The patient lives alone.  She is wheelchair bound.  REVIEW OF SYSTEMS: Essentially documented in the initial history and  physical.  PHYSICAL EXAMINATION: VITAL SIGNS:  At the time, the patient was seen by the admitting physician, blood pressure is 146/61, pulse 78, respiratory rate 24, temperature is 98.6, and saturating 94% on O2 via nasal cannula. HEENT:  Pupils are reactive to light and extraocular muscles are intact. NECK:  No jugular venous distention.  No carotid bruit.  No lymphadenopathy. CHEST:  Clear to auscultation. HEART:  Heart sounds are 1 and 2. ABDOMEN:  Obese and nontender.  Liver, spleen, and kidney not palpable. Bowel sounds are positive. EXTREMITIES:  Showed no pedal edema. MUSCULOSKELETAL SYSTEM:  Deformities in the knee as well as in the lower extremity. SKIN:  Warm and dry with vesicular lesions at the back. NEUROPSYCHIATRIC:  Evaluation was unremarkable.  LABORATORY DATA: Initial complete blood count with differential showed WBCs of 8.7, hemoglobin of 10.5, hematocrit of 31.0, MCV of 95.4 with a platelet count of 137.  Comprehensive metabolic panel showed sodium of 139, potassium of 4.7, chloride of 102 with a bicarbonate of 24, glucose is114, BUN is 22, and creatinine is 1.37.  Urinalysis showed positive nitrites with also large leukocyte esterase.  Urine microscopy showed wbc's too numerous to count with few bacteria and a repeat complete blood count with no differential done on October 17, 2010, showed WBC of 8.2, hemoglobin 9.5, hematocrit of 29.6, and MCV of 96.7 with a platelet  count of 129.  Urine culture grew Escherichia coli. Antibiogram is still pending and a repeat complete blood count with differential done on October 18, 2010, showed WBC of 7.5, hemoglobin 9.2, hematocrit of 27.5, MCV of 95.8 with a platelet count of 123. Comprehensive metabolic panel showed sodium of 139, potassium of 4.0, chloride of 106 with bicarbonate of 24, glucose is 94, BUN is 13, creatinine 0.95, and magnesium level is 1.7.  IMAGING STUDIES: Imaging studies done include CT of the  lumbar spine and it showed chronic interstitial lung changes with mild left convex lumbar scoliosis associated with degenerative changes.  HOSPITAL COURSE: The patient was admitted to general medical floor and was started on BiPAP 2 L per minute at night for her obstructive sleep apnea and also, normal saline IV to go at a rate of 100 cc an hour.  DVT prophylaxis was done with Lovenox and she was also given Zofran for her nausea.  Her pain control was done with Tylenol, hydrocodone/APAP, and morphine 2 mg to 4 mg IV q.4 h. p.r.n.  She was also started on treatment for UTI with Rocephin 1 g IV q.24 h.  The patient was placed on Accu-Cheks with regular insulin sliding scale.  She was evaluated by the in-house orthopedic surgeon, Dr. Magnus Ivan who recommended an overhead frame and trapeze, and the patient should be assisted by the PT from transfers to wheelchair.  The patient was however, seen by me for the very first time in this admission on October 17, 2010 and during this encounter, she complained about pain in her back and examination showed some vesicular lesions.  She also complained about some irritation along the folds of skin and examination showed flexural dermatitis.  Added to the patient's regimen following this encounter: 1. Valtrex 1 g p.o. t.i.d. 2. Tegretol 400 mg p.o. b.i.d. for herpetic neuralgia and also Zovirax     ointment to be applied b.i.d. to lesion.  She was also on     miconazole cream applied twice a day to flexural areas.  The     patient has so far had been followed by me and evaluated on daily     basis and she has some difficulty trying to transfer her from the     bed to the wheelchair.  Her current medications include the following: 1. Acyclovir apply topically b.i.d. 2. Carbamazepine 100 mg p.o. b.i.d. 3. Rocephin 1 g IV q.24 h. 4. Lovenox 40 mg subcutaneously q.24 h. 5. Metoprolol 25 mg p.o. b.i.d. 6. NovoLog insulin 1 to 9 units subcutaneously  q.4 h. 7. Miconazole cream apply topically b.i.d. 8. ReQuip/ropinirole 12.5 mg p.o. b.i.d. 9. Valtrex 1 g p.o. t.i.d. 10.Acetaminophen 650 mg p.o. q.4 h. p.r.n. 11.Vicodin 1 to 2 tablets p.o. q.4 h. p.r.n. 12.Morphine 2 mg to 4 mg IV q.4 h. P.r.n. 13.Zofran 4 mg IV q.6 h. p.r.n.  The patient will be followed and evaluated on day-to-day basis.     Talmage Nap, MD     CN/MEDQ  D:  10/18/2010  T:  10/18/2010  Job:  [id number]  Electronically Signed by Talmage Nap  on 10/18/2010 07:06:02 PM

## 2010-10-19 LAB — CBC
HCT: 28.8 % — ABNORMAL LOW (ref 36.0–46.0)
Hemoglobin: 9.3 g/dL — ABNORMAL LOW (ref 12.0–15.0)
MCV: 97 fL (ref 78.0–100.0)
RBC: 2.97 MIL/uL — ABNORMAL LOW (ref 3.87–5.11)
RDW: 15.6 % — ABNORMAL HIGH (ref 11.5–15.5)
WBC: 6.6 10*3/uL (ref 4.0–10.5)

## 2010-10-19 LAB — DIFFERENTIAL
Basophils Absolute: 0 10*3/uL (ref 0.0–0.1)
Eosinophils Relative: 4 % (ref 0–5)
Lymphocytes Relative: 22 % (ref 12–46)
Lymphs Abs: 1.5 10*3/uL (ref 0.7–4.0)
Neutro Abs: 4.3 10*3/uL (ref 1.7–7.7)
Neutrophils Relative %: 65 % (ref 43–77)

## 2010-10-19 LAB — GLUCOSE, CAPILLARY: Glucose-Capillary: 100 mg/dL — ABNORMAL HIGH (ref 70–99)

## 2010-10-19 LAB — COMPREHENSIVE METABOLIC PANEL
AST: 23 U/L (ref 0–37)
Albumin: 3.1 g/dL — ABNORMAL LOW (ref 3.5–5.2)
Alkaline Phosphatase: 37 U/L — ABNORMAL LOW (ref 39–117)
BUN: 11 mg/dL (ref 6–23)
CO2: 24 mEq/L (ref 19–32)
Chloride: 108 mEq/L (ref 96–112)
GFR calc non Af Amer: >60 mL/min (ref >60)
Potassium: 4.3 mEq/L (ref 3.5–5.1)
Total Bilirubin: 0.3 mg/dL (ref 0.3–1.2)

## 2010-10-19 LAB — MAGNESIUM: Magnesium: 1.9 mg/dL (ref 1.5–2.5)

## 2010-10-20 ENCOUNTER — Inpatient Hospital Stay (HOSPITAL_COMMUNITY): Payer: Medicare Other

## 2010-10-20 LAB — DIFFERENTIAL
Lymphs Abs: 1.4 10*3/uL (ref 0.7–4.0)
Monocytes Absolute: 0.7 10*3/uL (ref 0.1–1.0)
Monocytes Relative: 9 % (ref 3–12)
Neutro Abs: 4.9 10*3/uL (ref 1.7–7.7)
Neutrophils Relative %: 68 % (ref 43–77)

## 2010-10-20 LAB — COMPREHENSIVE METABOLIC PANEL
ALT: 14 U/L (ref 0–35)
BUN: 11 mg/dL (ref 6–23)
CO2: 23 mEq/L (ref 19–32)
Calcium: 8.7 mg/dL (ref 8.4–10.5)
Creatinine, Ser: 0.78 mg/dL (ref 0.50–1.10)
GFR calc Af Amer: >60 mL/min (ref >60)
GFR calc non Af Amer: >60 mL/min (ref >60)
Glucose, Bld: 94 mg/dL (ref 70–99)

## 2010-10-20 LAB — BLOOD GAS, ARTERIAL
Acid-base deficit: 0.6 mmol/L (ref 0.0–2.0)
TCO2: 20.3 mmol/L (ref 0–100)
pCO2 arterial: 32.7 mmHg — ABNORMAL LOW (ref 35.0–45.0)

## 2010-10-20 LAB — GLUCOSE, CAPILLARY
Glucose-Capillary: 102 mg/dL — ABNORMAL HIGH (ref 70–99)
Glucose-Capillary: 112 mg/dL — ABNORMAL HIGH (ref 70–99)

## 2010-10-20 LAB — CBC
HCT: 27.9 % — ABNORMAL LOW (ref 36.0–46.0)
Hemoglobin: 9.3 g/dL — ABNORMAL LOW (ref 12.0–15.0)
MCH: 31.6 pg (ref 26.0–34.0)
MCV: 94.9 fL (ref 78.0–100.0)
RBC: 2.94 MIL/uL — ABNORMAL LOW (ref 3.87–5.11)

## 2010-10-20 LAB — CARDIAC PANEL(CRET KIN+CKTOT+MB+TROPI)
Relative Index: 3.8 — ABNORMAL HIGH (ref 0.0–2.5)
Total CK: 101 U/L (ref 7–177)

## 2010-10-20 LAB — MAGNESIUM: Magnesium: 1.8 mg/dL (ref 1.5–2.5)

## 2010-10-20 MED ORDER — XENON XE 133 GAS
10.0000 | GAS_FOR_INHALATION | Freq: Once | RESPIRATORY_TRACT | Status: AC | PRN
  Administered 2010-10-20: 10 via RESPIRATORY_TRACT

## 2010-10-20 MED ORDER — TECHNETIUM TO 99M ALBUMIN AGGREGATED
6.0000 | Freq: Once | INTRAVENOUS | Status: AC | PRN
  Administered 2010-10-20: 6 via INTRAVENOUS

## 2010-10-21 LAB — COMPREHENSIVE METABOLIC PANEL
Alkaline Phosphatase: 52 U/L (ref 39–117)
BUN: 15 mg/dL (ref 6–23)
CO2: 24 mEq/L (ref 19–32)
GFR calc Af Amer: >60 mL/min (ref >60)
GFR calc non Af Amer: >60 mL/min (ref >60)
Glucose, Bld: 97 mg/dL (ref 70–99)
Potassium: 3.5 mEq/L (ref 3.5–5.1)
Total Bilirubin: 0.3 mg/dL (ref 0.3–1.2)
Total Protein: 6.3 g/dL (ref 6.0–8.3)

## 2010-10-21 LAB — CARDIAC PANEL(CRET KIN+CKTOT+MB+TROPI)
Troponin I: <0.3 ng/mL (ref <0.30)
Troponin I: <0.3 ng/mL (ref <0.30)

## 2010-10-21 LAB — CBC
HCT: 30.1 % — ABNORMAL LOW (ref 36.0–46.0)
Hemoglobin: 9.9 g/dL — ABNORMAL LOW (ref 12.0–15.0)
MCHC: 32.9 g/dL (ref 30.0–36.0)

## 2010-10-21 LAB — DIFFERENTIAL
Basophils Absolute: 0 10*3/uL (ref 0.0–0.1)
Lymphocytes Relative: 24 % (ref 12–46)
Monocytes Absolute: 0.5 10*3/uL (ref 0.1–1.0)
Monocytes Relative: 8 % (ref 3–12)
Neutro Abs: 4.3 10*3/uL (ref 1.7–7.7)

## 2010-10-21 LAB — GLUCOSE, CAPILLARY: Glucose-Capillary: 112 mg/dL — ABNORMAL HIGH (ref 70–99)

## 2010-10-21 LAB — MAGNESIUM: Magnesium: 1.8 mg/dL (ref 1.5–2.5)

## 2010-10-22 LAB — BASIC METABOLIC PANEL
BUN: 15 mg/dL (ref 6–23)
CO2: 27 mEq/L (ref 19–32)
Chloride: 103 mEq/L (ref 96–112)
Glucose, Bld: 99 mg/dL (ref 70–99)
Potassium: 3.7 mEq/L (ref 3.5–5.1)
Sodium: 141 mEq/L (ref 135–145)

## 2010-10-22 LAB — CBC
HCT: 31.3 % — ABNORMAL LOW (ref 36.0–46.0)
Hemoglobin: 10.2 g/dL — ABNORMAL LOW (ref 12.0–15.0)
RBC: 3.34 MIL/uL — ABNORMAL LOW (ref 3.87–5.11)
WBC: 8.8 10*3/uL (ref 4.0–10.5)

## 2010-10-22 LAB — GLUCOSE, CAPILLARY
Glucose-Capillary: 123 mg/dL — ABNORMAL HIGH (ref 70–99)
Glucose-Capillary: 144 mg/dL — ABNORMAL HIGH (ref 70–99)
Glucose-Capillary: 107 mg/dL — ABNORMAL HIGH (ref 70–99)
Glucose-Capillary: 126 mg/dL — ABNORMAL HIGH (ref 70–99)

## 2010-10-23 LAB — CBC
HCT: 32.3 % — ABNORMAL LOW (ref 36.0–46.0)
MCH: 31.6 pg (ref 26.0–34.0)
MCHC: 34.1 g/dL (ref 30.0–36.0)
RDW: 14.9 % (ref 11.5–15.5)

## 2010-10-23 LAB — GLUCOSE, CAPILLARY
Glucose-Capillary: 97 mg/dL (ref 70–99)
Glucose-Capillary: 123 mg/dL — ABNORMAL HIGH (ref 70–99)

## 2010-11-16 ENCOUNTER — Telehealth: Payer: Self-pay | Admitting: *Deleted

## 2010-11-16 NOTE — Telephone Encounter (Signed)
I LMTCBx1 with the pt because her appt tomorrow was supposed to be with a PT and it was not scheduled. Pt will need to r/s appt unless she is having acute issues. Carron Curie, CMA

## 2010-11-17 ENCOUNTER — Ambulatory Visit (INDEPENDENT_AMBULATORY_CARE_PROVIDER_SITE_OTHER): Payer: Medicare Other | Admitting: Pulmonary Disease

## 2010-11-17 DIAGNOSIS — J8410 Pulmonary fibrosis, unspecified: Secondary | ICD-10-CM

## 2010-11-17 NOTE — Telephone Encounter (Signed)
LMTCBx2. Pt needs to r/s her appt, she needs a PFT with ROV. Carron Curie, CMA

## 2010-11-17 NOTE — Progress Notes (Signed)
  Subjective:    Patient ID: Holly [name], female    DOB: 1932/02/07, 77 y.o.   MRN: [id number]  HPI No visit.  PFT's weren't done before visit.   Therefore no visit.    Review of Systems     Objective:   Physical Exam        Assessment & Plan:

## 2010-11-17 NOTE — Telephone Encounter (Signed)
Pt came in and appt was r/s per [facility]. Carron Curie, CMA

## 2010-11-19 NOTE — Discharge Summary (Signed)
NAMEDEVINN, VOSHELL NO.:  [id number]  MEDICAL RECORD NO.:  [id number]  LOCATION:  1520                         FACILITY:  [facility]  PHYSICIAN:  Zannie Cove, MD     DATE OF BIRTH:  [birth year]  DATE OF ADMISSION:  10/16/2010 DATE OF DISCHARGE:                        DISCHARGE SUMMARY - REFERRING   PRIMARY CARE PHYSICIAN:  Dr. Juluis Rainier.  ORTHOPEDIC SURGEONS: 1. Nadara Mustard, MD 2. Dr. Alvester Morin.  CARDIOLOGIST:  [name], MD  DISCHARGE DIAGNOSES: 1. Escherichia coli urinary tract infection. 2. Shingles/herpes zoster outbreak on the lower back. 3. Congestive heart failure, likely diastolic with volume overload,     improved. 4. History of coronary artery disease with percutaneous coronary     intervention x2 left anterior descending artery stent in 2001, and     then drug-eluting stent to the left anterior descending artery in     February 2011. 5. Severe spinal stenosis on epidural spinal injections. 6. Type 2 diabetes. 7. Obesity. 8. Obstructive sleep apnea. 9. Chronic back pain secondary to spinal stenosis. 10.Charcot joints. 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 12.Deconditioning.  Discharge medications are as follows: 1. Zofran 4 mg q.6 h. p.r.n. 2. Lisinopril 10 mg tablet dose is half a tab p.o. daily. 3. Gabapentin 100 mg p.o. b.i.d. 4. Effient (prasugrel) 10 mg tablet, half a tablet daily, currently on     hold until epidural spinal injection to be done as outpatient.     Please note, this medication is on hold. 5. Meclizine 12.5 mg p.o. b.i.d. for 2 days and then change to p.r.n. 6. Plaquenil 200 mg p.o. b.i.d. 7. Lopressor 50 mg tablet 1.5 tablets p.o. b.i.d. 8. Amoxicillin 500 mg 4 capsules before dental appointment. 9. Simcor (Niacin SR/simvastatin) 1000/20 mg 1 tablet p.o. at bedtime. 10.Lasix 20 mg p.o. b.i.d. 11.Multivitamin 1 tablet daily. 12.ReQuip 0.5 mg p.o.  b.i.d. 13.Hydrocodone/APAP 7.5/500 mg p.o. 1-2 tablets q.6 h. p.r.n. 14.Aspirin enteric coated 81 mg daily, also on hold for epidural     spinal injection. 15.Cinnamon 2 capsules daily. 16.Calcium with phosphorus over-the-counter 1 tablet daily. 17.Calcium and Vitamin Support over-the-counter 1 tablet b.i.d. 18.Multivitamin 1 tablet daily. 19.Iron plus 18 mg p.o. daily. 20.Liver and Brain Benefits over-the-counter 1 tablet daily. 21.Resveratrol 50 mg 1 tablet daily. 22.Ultimate Eye Support 1 capsule daily. 23.Folic acid 1 tablet b.i.d. 11.BJYNWGN B12, 500 mcg daily. 25.Vitamin D3, 1000 units p.o. daily.  CONSULTANTS: 1. Vanita Panda. Magnus Ivan, MD with Orthopedic Surgery. 2. Interventional Radiology.  Diagnostics investigations showed KUB on October 16, 2010, unremarkable bowel gas pattern.  V/Q scan on October 21, 2010, shows low probability of PE, mild cardiomegaly, bilateral air trapping noted within the right middle lung and upper lung.  Chest x-ray on October 21, 2010, showed vascular congestion, cardiomegaly, bilateral perihilar and left basilar airspace consolidation, likely reflecting pulmonary edema.  HOSPITAL COURSE:  Ms. Gadomski is a very pleasant 77 year old female with severe spinal stenosis, getting periodic epidural steroid injections, who was admitted to the hospital with worsening back pain, nausea and vomiting.  On evaluation, she was found to have shingles outbreak on her lower back in addition  to her chronic spinal stenosis pain as well as E. coli urinary tract infection. 1. For E coli urinary infection, she was originally treated with     Rocephin, subsequently transitioned to Cefpodoxime based on the     culture and sensitivity.  She has completed her antibiotic course     for this and is clinically improved. 2. Shingles/herpes zoster outbreak break of the lower back.  This was     treated with valacyclovir and Tegretol.  The patient clinically      improved, completed 7-day course of Valtrex and her herpetic     neurology symptoms have improved.  Hence, Tegretol has been     discontinued as well. 3. Chronic spinal stenosis pain.  This has been an ongoing issue for     several years.  She was seen by Dr. Phoebe Perch in the past and did not     feel that the risk of surgery was what considering as well as there     was a question of whether her pain would be significantly improved     postop and hence, at this point, she is being managed by Dr. Alvester Morin     with intermittent steroid injections.  I did have Interventional     Radiology consult to see the patient with possible ESI during     hospitalization.  Unfortunately, an interventional radiologist     doing this was not available this week at [facility].  In     addition, her shingles outbreak with overlying the same area where     she would have needed the steroid injection and hence, this is     being deferred to be done as outpatient.  Her aspirin and prasugrel     (Effient) will be on hold, pending the epidural spinal injection. 4. Diabetes, stable. 5. CHF.  She did have evidence of dyspnea on exertion in one of the     hospital days and chest x-ray showed evidence of congestive heart     failure which is likely secondary to iatrogenic fluids     administration.  She improved with aggressive diuresis and is being     transitioned to p.o. Lasix.  She had an echocardiogram done, the     results of which are pending.  We will request her primary     physician and Dr. Mayford Knife to follow up on the results and manage     accordingly.  She continues on her beta-blocker as well as ACE     inhibitor.  Rest of her chronic medical problems remained stable.  Please note Effient and aspirin on hold until epidural spinal injection and hence, these should not be given to her at the nursing home until after she has spinal injection.  DISCHARGE CONDITION:  Stable.  VITAL SIGNS:  Temperature is  98.6, pulse 77, blood pressure 156/82, respirations 18, saturating 92% on room.  DISCHARGE LABORATORY DATA:  White count of 8.9, hemoglobin 11, platelets 166.  DISCHARGE FOLLOWUP: 1. Dr. Zachery Dauer in 1 week. 2. Dr. Alvester Morin within 1 week for epidural spinal injection. 3. Dr. [name] in 1 month.     Zannie Cove, MD     PJ/MEDQ  D:  10/23/2010  T:  10/23/2010  Job:  [id number]  cc:   Dr. Early Osmond, MD Dr. Juluis Rainier [name], M.D.  Electronically Signed by Zannie Cove  on 11/19/2010 02:19:38 PM

## 2010-11-25 NOTE — H&P (Signed)
[name], Holly [name] NO.:  [id number]  MEDICAL RECORD NO.:  [id number]  LOCATION:  WLED                         FACILITY:  [facility]  PHYSICIAN:  Calvert Cantor, M.D.     DATE OF BIRTH:  1931-07-03  DATE OF ADMISSION:  10/16/2010 DATE OF DISCHARGE:                             HISTORY & PHYSICAL   PRIMARY CARE PHYSICIAN:  Dr. Juluis Rainier at [place].  ORTHOPEDIC SURGEONS:  Nadara Mustard, MD and Dr. Alvester Morin.  PRESENTING COMPLAINT:  Back pain.  HISTORY OF PRESENT ILLNESS:  This is a 77 year old female with severe spinal stenosis who has been receiving epidural injections for her back pain.  The patient is also hypertensive, mildly diabetic, and completely wheelchair bound.  The patient states that she was able to transfer from bed to chair up until yesterday when she was no longer able to do this.  She came into the ER yesterday, was found to have a patch of shingles on her back, given a prescription for Valtrex and discharged home.  However, the patient states that she has continued to be weak and is unable to transfer.  She states by the way that she has been having some nausea and vomiting over the past few days and has not been able to take her medications either.  Vomiting usually occurs after she eats.  She has not had any diarrhea, has not had any abdominal pain.  She has not had any fevers or chills.  She also admits to having dizziness.  She relates this more with pain and weakness and not with the vomiting.  Currently, she is not feeling any dizziness.  Her pain is controlled.  She states that the back pain radiates down her right hip to her foot and she is due for an epidural injection this coming Monday which is in 3 days.  However, due to the fact that she is no longer able to transfer on her own, she has decided to come to the hospital.  In addition, she mentions that her right knee has been swollen for the past 2 weeks.  She has never had  swelling in that knee in the past.  She has had total knee replacement bilaterally.  PAST MEDICAL HISTORY: 1. Hypertension. 2. Diabetes mellitus, diet controlled. 3. Myocardial infarction with stent to the LAD. 4. Severe spinal stenosis, receiving epidural injections. 5. Recent urinary tract infection, looking up the report it was E     coli. 6. Some sort of autoimmune disease, I have only favor this out because     I asked her why she is on Plaquenil, she states her rheumatologist     prescribed it for her and she is not sure why. 7. Charcot joint. 8. Bilateral knee replacements. 9. Rods in bilateral legs. 10.Obstructive sleep apnea, on BiPAP with 2 L of oxygen at night. 11.Morbid obesity. 12.Scoliosis. 13.Diagnosed with shingles yesterday. 14.Blind in right eye.  SOCIAL HISTORY:  Does not smoke or drink or use drugs.  She lives alone. She is wheelchair bound.  ALLERGIES: 1. ADHESIVE TAPE. 2. SULFA causes vomiting. 3. PLAVIX causes a rash. 4. IV CONTRAST. 5. According to records in  our computer system, NORVASC causes nausea     and swelling.  REVIEW OF SYSTEMS:  She has been attempting to lose weight.  She is not sure how much she has lost because she cannot stand and weigh herself, but she has lost weight.  No frequent headaches.  HEENT:  She is blind in the right eye.  She was told it may be due to some kind of clotting in the blood vessels.  No sinus trouble, sore throat, or earache. RESPIRATORY: No cough or shortness of breath.  CARDIAC: No chest pain or palpitations.  She has problems with pedal edema.  GI: As per HPI.  GU: She is incontinent of urine.  She currently does not notice any dysuria. She has not noticed any hematuria or any suprapubic pain. HEMATOLOGICALLY:  Bruises easily.  SKIN: No rash.  MUSCULOSKELETAL: Back pain radiating down the right leg and right knee swelling. NEUROLOGICALLY:  No history of stroke or seizure, but her head CT here reveals remote  infarct in the right temporal/parietal lobe with minimal dilation of the right atrium.  PSYCHOLOGICALLY:  No anxiety, depression.  PHYSICAL EXAMINATION:  VITAL SIGNS:  Blood pressure 146/61, pulse 78, respiratory rate 24, temperature 98.6, oxygen saturation 94% on 2 L of oxygen, 88% on room air. HEENT:  Pupils equal, round, reacting to light.  Extraocular movements are intact.  Conjunctivae pink.  Oral mucosa is very dry. NECK:  Supple.  No thyromegaly or lymphadenopathy. HEART:  Regular rate and rhythm.  No murmurs, rubs, or gallops. LUNGS: Clear bilaterally.  Normal respiratory effort.  No use of accessory muscles. ABDOMEN:  Soft, nontender, nondistended.  Bowel sounds are not present. Unable to assess for organomegaly due to her obesity.  EXTREMITIES:  No cyanosis, clubbing, or edema.  She has deformities of both feet and ankles.  She has swelling of her right knee.  There is some tenderness present to palpation.  There is no erythema or warmth noted. NEUROLOGICALLY:  Cranial nerves II-XII are intact.  She is able to move all 4 extremities appropriately. PSYCHOLOGICALLY:  Awake, alert, oriented x3.  Mood and affect normal.SKIN:  Warm, dry.  Looking at her back, there was a small patch in the right sacral area of maybe 10-12 lesions.  There are no vesicles present and lesions appear to being crusted.  BLOOD WORK:  Notable blood work:  Hemoglobin 10.5, hematocrit 31, this is not new.  Platelets are little low at 137, which is new.  Creatinine is 1.37, which is not new.  UA reveals a cloudy urine, small amount of blood, positive for nitrites, large amount of leukocytes, too numerous to count WBCs, few bacteria.  CT of the head without contrast done for dizziness, nausea, and weakness reveals remote infarcts posterior right temporoparietal lobe.  No intracranial hemorrhage or CT evidence of a large acute infarct, small vessel disease type changes are present.  There were  vascular calcifications.  EKG has been ordered by myself and is pending.  On the telemonitor, she appears to be having PACs.  ASSESSMENT AND PLAN: 1. Acute-on-chronic back pain with inability to transfer.  The patient     will most likely need her epidural prior to being able to function     as she usually did.  We will monitor her over the weekend.  If     there is no improvement, we may need to transfer her on Monday to     Dr. [place] Blas office to get the epidural.  We will get PT, OT to     evaluate her. 2. Vomiting for past 2-3 days, mainly when she eats with decreased     bowel sounds.  I will check a KUB to make sure she does not have an     ileus. 3. Urinary tract infection.  She last grew out E coli, which was     sensitive to Rocephin.  We will send a culture and start her on IV     Rocephin.  This may be the cause for her vomiting mentioned in #2. 4. Dizziness may be due to severe pain or due to the urinary tract     infection.  CT head does not reveal anything new. 5. Right knee swollen and tender.  I have asked Orthopedics to     evaluate her.  Dr. Magnus Ivan will see her tomorrow.  Does not appear     to be gout or septic joint, possibly is related to osteoarthritis. 6. Shingles.  This very minor and appears to be resolving.  I will     hold off on placing her on Valtrex.  We will just monitor the area. 7. Dehydrated.  We will hold her Lasix.  She has not been able to take     it anyway in the past few days due to vomiting.  We will place her     on IV fluids until she is able to eat. 8. Mild thrombocytopenia.  Continue to follow. 9. Chronic kidney disease stage 3. 10.Obstructive sleep apnea.  We will order CPAP machine with oxygen. 11.Hypoxia on room air may be related to obesity hypoventilation and     she may chronically be hypoxic, even though she says she only uses     O2 at night.  She may need this round-the-clock, this should be     checked prior to discharging  her. 12.Morbid obesity. 13.Diabetes mellitus, diet controlled.  We will place her on a sliding     scale. 14.Hypertension.  I will resume her metoprolol, but hold off on her     ACE inhibitor until she is better hydrated. 15.Wheelchair bound for years. 16.Coronary artery disease status post stenting.  She is on Effient,     this is on hold for her epidural this coming Monday. 17.Hyperlipidemia.  She takes niacin and 3 aspirins along with it to     control the side effects from niacin.  While     she is vomiting, I will hold both. 18.DVT prophylaxis with Lovenox.  PT, OT eval.  IV Pepcid.  Time on admission was 65 minutes.     Calvert Cantor, M.D.     SR/MEDQ  D:  10/16/2010  T:  10/16/2010  Job:  [id number]  cc:   Dr. Bronson Ing, MD Fax: [phone]  Dr. Alvester Morin  Electronically Signed by Calvert Cantor M.D. on 11/25/2010 07:14:52 PM

## 2010-12-14 ENCOUNTER — Telehealth: Payer: Self-pay | Admitting: Pulmonary Disease

## 2010-12-14 NOTE — Telephone Encounter (Signed)
LMTCB

## 2010-12-15 NOTE — Telephone Encounter (Signed)
lmomtcb  

## 2010-12-16 NOTE — Telephone Encounter (Signed)
LMOMTCB x 1 

## 2010-12-17 NOTE — Telephone Encounter (Signed)
LMOM for pt TCB.  We have attempted to call pt multiple times.  Per protocol, will sign of on this message and wait for pt to return our call.

## 2010-12-18 ENCOUNTER — Ambulatory Visit (INDEPENDENT_AMBULATORY_CARE_PROVIDER_SITE_OTHER): Payer: Medicare Other | Admitting: Pulmonary Disease

## 2010-12-18 ENCOUNTER — Encounter: Payer: Self-pay | Admitting: Pulmonary Disease

## 2010-12-18 DIAGNOSIS — I2789 Other specified pulmonary heart diseases: Secondary | ICD-10-CM

## 2010-12-18 DIAGNOSIS — J8410 Pulmonary fibrosis, unspecified: Secondary | ICD-10-CM

## 2010-12-18 LAB — PULMONARY FUNCTION TEST

## 2010-12-18 NOTE — Patient Instructions (Signed)
Continue to work on weight loss Stay on cpap and oxygen  followup with me in 6mos.

## 2010-12-18 NOTE — Progress Notes (Signed)
PFT done today. 

## 2010-12-18 NOTE — Assessment & Plan Note (Signed)
The patient has known interstitial disease that is probably due to UIP or NSIP.  She may have an autoimmune process that is driving this.  She is a very poor candidate for high dose prednisone, and I question whether she is a candidate for more aggressive immunotherapy.  Her total lung capacity has been very stable, but her diffusion capacity has decreased from last year.  This is not necessarily due to her interstitial disease, and can be greatly affected by chronic diastolic heart failure.  I would not recommend any change in treatment from a pulmonary standpoint at this time.  The patient is not able to walk, and therefore would not require exertional oxygen.  Her sats are adequate at rest.  I have encouraged her to continue working on aggressive weight loss.

## 2010-12-18 NOTE — Progress Notes (Signed)
  Subjective:    Patient ID: Holly [name], female    DOB: July 23, 1931, 77 y.o.   MRN: [id number]  HPI The patient comes in today for followup of her known interstitial lung disease.  It is unclear whether this is related to an autoimmune process, or whether it may be due to IPF.  The patient also has many other medical issues that contribute to her dyspnea on exertion.  She has had followup PFTs today that showed no airflow obstruction, no restriction, and a severe decrease in diffusion capacity.  Her total lung capacity is actually improved from last year, and her diffusion capacity is mildly reduced from last year.  The patient feels that her breathing is at her usual baseline.  She denies a chronic dry cough.   Review of Systems  Constitutional: Negative for fever and unexpected weight change.  HENT: Positive for voice change and postnasal drip. Negative for ear pain, nosebleeds, congestion, sore throat, rhinorrhea, sneezing, trouble swallowing, dental problem and sinus pressure.   Eyes: Negative for redness and itching.  Respiratory: Positive for cough and shortness of breath. Negative for chest tightness and wheezing.   Cardiovascular: Negative for palpitations and leg swelling.  Gastrointestinal: Positive for nausea, vomiting and constipation.  Genitourinary: Negative for dysuria.  Musculoskeletal: Positive for back pain. Negative for joint swelling.  Skin: Negative for rash.  Neurological: Positive for headaches.  Hematological: Bruises/bleeds easily.  Psychiatric/Behavioral: Negative for dysphoric mood. The patient is not nervous/anxious.        Objective:   Physical Exam Morbidly obese female in no acute distress Nose without purulence or discharge noted.  No skin breakdown or pressure necrosis from the CPAP mask Chest with faint basilar crackles, poor depth of inspiration, no wheezing Cardiac exam distant heart sounds, but sound regular Lower extremities with mild edema, no  cyanosis noted Alert and oriented, moves all 4 extremities.       Assessment & Plan:

## 2010-12-23 ENCOUNTER — Encounter: Payer: Self-pay | Admitting: Pulmonary Disease

## 2011-03-23 ENCOUNTER — Ambulatory Visit (HOSPITAL_COMMUNITY)
Admission: RE | Admit: 2011-03-23 | Discharge: 2011-03-23 | Disposition: A | Discharge status: Home / Self Care | Payer: Medicare Other | Source: Ambulatory Visit | Attending: Podiatry | Admitting: Podiatry

## 2011-03-23 DIAGNOSIS — E119 Type 2 diabetes mellitus without complications: Secondary | ICD-10-CM

## 2011-03-23 DIAGNOSIS — I2510 Atherosclerotic heart disease of native coronary artery without angina pectoris: Secondary | ICD-10-CM

## 2011-03-23 DIAGNOSIS — M79609 Pain in unspecified limb: Secondary | ICD-10-CM | POA: Insufficient documentation

## 2011-03-23 DIAGNOSIS — L97509 Non-pressure chronic ulcer of other part of unspecified foot with unspecified severity: Secondary | ICD-10-CM

## 2011-03-23 DIAGNOSIS — L98499 Non-pressure chronic ulcer of skin of other sites with unspecified severity: Secondary | ICD-10-CM | POA: Insufficient documentation

## 2011-03-23 NOTE — Progress Notes (Signed)
VASCULAR LAB PRELIMINARY  PRELIMINARY  PRELIMINARY  PRELIMINARY  ARTERIAL  ABI completed:    RIGHT    LEFT    PRESSURE WAVEFORM  PRESSURE WAVEFORM  BRACHIAL 156 Biphasic BRACHIAL 159 Biphasic  DP 146 Biphasic DP 160 Biphasic  PT 142 Biphasic PT >300 Biphasic                         RIGHT LEFT  ABI 0.92 N/A   Technically difficult secondary to edema and anatomy of the foot. Doppler waveforms are within normal limits bilaterally. ABIs on the right are within normal limits. ABIs on the left were not ascertained in the posterior tibial artery due to incompressible vessel probable secondary to calcification. The ABI at the dorsalis pedis is normal   Holly [name] D, 03/23/2011, 3:37 PM

## 2011-04-06 ENCOUNTER — Encounter (HOSPITAL_BASED_OUTPATIENT_CLINIC_OR_DEPARTMENT_OTHER): Payer: Medicare Other

## 2011-04-12 ENCOUNTER — Ambulatory Visit (HOSPITAL_COMMUNITY)
Admission: RE | Admit: 2011-04-12 | Discharge: 2011-04-12 | Disposition: A | Discharge status: Home / Self Care | Payer: Medicare Other | Source: Ambulatory Visit | Attending: Internal Medicine | Admitting: Internal Medicine

## 2011-04-12 ENCOUNTER — Other Ambulatory Visit (HOSPITAL_BASED_OUTPATIENT_CLINIC_OR_DEPARTMENT_OTHER): Payer: Self-pay | Admitting: Internal Medicine

## 2011-04-12 ENCOUNTER — Encounter (HOSPITAL_BASED_OUTPATIENT_CLINIC_OR_DEPARTMENT_OTHER): Payer: Medicare Other | Attending: Internal Medicine

## 2011-04-12 DIAGNOSIS — M2040 Other hammer toe(s) (acquired), unspecified foot: Secondary | ICD-10-CM | POA: Insufficient documentation

## 2011-04-12 DIAGNOSIS — S93326A Dislocation of tarsometatarsal joint of unspecified foot, initial encounter: Secondary | ICD-10-CM | POA: Insufficient documentation

## 2011-04-12 DIAGNOSIS — L97509 Non-pressure chronic ulcer of other part of unspecified foot with unspecified severity: Secondary | ICD-10-CM | POA: Insufficient documentation

## 2011-04-12 DIAGNOSIS — Z79899 Other long term (current) drug therapy: Secondary | ICD-10-CM | POA: Insufficient documentation

## 2011-04-12 DIAGNOSIS — I10 Essential (primary) hypertension: Secondary | ICD-10-CM | POA: Insufficient documentation

## 2011-04-12 DIAGNOSIS — E1169 Type 2 diabetes mellitus with other specified complication: Secondary | ICD-10-CM | POA: Insufficient documentation

## 2011-04-12 DIAGNOSIS — X58XXXA Exposure to other specified factors, initial encounter: Secondary | ICD-10-CM | POA: Insufficient documentation

## 2011-04-12 DIAGNOSIS — M869 Osteomyelitis, unspecified: Secondary | ICD-10-CM

## 2011-04-12 DIAGNOSIS — J984 Other disorders of lung: Secondary | ICD-10-CM | POA: Insufficient documentation

## 2011-04-12 DIAGNOSIS — M069 Rheumatoid arthritis, unspecified: Secondary | ICD-10-CM | POA: Insufficient documentation

## 2011-04-12 DIAGNOSIS — I509 Heart failure, unspecified: Secondary | ICD-10-CM | POA: Insufficient documentation

## 2011-04-12 DIAGNOSIS — Z7901 Long term (current) use of anticoagulants: Secondary | ICD-10-CM | POA: Insufficient documentation

## 2011-04-12 DIAGNOSIS — Z981 Arthrodesis status: Secondary | ICD-10-CM | POA: Insufficient documentation

## 2011-04-13 NOTE — Progress Notes (Signed)
[facility]  NAME:  Holly [name], Holly [name]                 ACCOUNT NO.:  [id number]  MEDICAL RECORD NO.:  [id number]      DATE OF BIRTH:  09/09/1931  PHYSICIAN:  Jonelle Sports. Saory Carriero, M.D.  VISIT DATE:  04/12/2011                                  OFFICE VISIT   This is a 78 year old white female who is seen for evaluation of an open ulceration on the tip of the left second toe.  Her history is that of a recent hospitalization followed by rehab unit stay.  It was at the rehab center where several weeks ago, she was placed in a therapy pool without any shoes or protective footwear and apparently sustained an avulsive lesion on the tip of her left second toe, which is a hammertoe in configuration despite some earlier surgery which reduced this somewhat.  That was treated simply but did not go on to healing and eventually she was sent to see Dr. Santiago Bumpers who found adequate ABIs in the anterior tibial arteries but was unable to get one in the left posterior tibial artery because of the incompressible nature of the vessel.  She did have biphasic waveforms throughout both the dorsalis pedis and posterior tibials in both feet. At that time, he treated her with limited measures to include debridement and Silvadene dressing.  She had been previously placed on cephalexin and was continued on that at that time at a dose of 500 mg q.i.d. which she apparently completed just yesterday.  With the failure of this wound to heal, she had called Dr. Theotis Burrow office on February 15 and was referred here for further evaluation and treatment.  PAST MEDICAL HISTORY:  Notable for remote tonsillectomy and appendectomy.  Some previous right foot surgery, the time and nature of which is not recalled with certainty and placement of a heart stent some 2 years ago.  She has had hospitalizations in association with the CVA which occurred 2 years ago and with episodes of atrial fibrillation.  She  has also been hospitalized for evaluation of interstitial lung disease.  ALLERGIES:  She is said to be allergic to IVP DYE, PLAVIX, NORVASC, and SULFA.  REGULAR MEDICATIONS: 1. ReQuip 2 times per day. 2. Simvastatin 20 mg daily. 3. Lisinopril 2.5 mg daily. 4. Hydroxychloroquine 200 mg b.i.d. 5. Folic acid 1 mg b.i.d. 6. Metoprolol 50 mg b.i.d. 7. Lasix 20 mg 1/2 tablet daily. 8. Warfarin 5 mg by schedule. 9. Hydrocodone 10/500 when needed. 10.Ecotrin 81 mg daily. 11.Multiple vitamin. 12.Calcium phosphate 600 mg daily.  FAMILY HISTORY:  Apparently is positive for diabetes and hypertension, otherwise not particularly contributory to the patient's present illness.  PERSONAL HISTORY:  The patient is currently living at home.  Has assistance of a friend who changes her dressings and meets her needs generally otherwise.  She has a [employer].  Has been retired for a number of years.  Does not smoke, use alcohol or unprescribed drugs and says that she is generally a calm person.  REVIEW OF SYSTEMS:  The patient has a history of hypertension, some controlled congestive heart failure, chronic lung disease which is thought to be an interstitial problem, the exact nature of which is unknown.  She does have rheumatoid arthritis, which is the basis  for her hydroxychloroquine and has diabetes which is basically diet controlled with her every morning blood sugars being in the low 90s.  She denies any acute problems at the moment except for the issue associated with her toe about which she seems quite disturbed.  PHYSICAL EXAMINATION:  VITAL SIGNS:  Blood pressure is 150/85, pulse 64 and actually regular today, respirations 18.  Capillary blood glucose 93. GENERAL:  She is alert, coherent, cooperative, obviously quite obese, is wheelchair confined, in no immediate distress. SKIN:  Texture, turgor, and color are all satisfactory. EXTREMITIES:  She does have some pitting edema in  her ankles bilaterally.  Her distal pulses are not easily palpable because of this edema but we do have the vascular studies as indicated above which suggest adequate circulation.  Her capillary filling in her hallux is certainly satisfactory to include on the left side.  General examination was not accomplished.  On the left foot at the tip of the second toe, there is a small open lesion and a blister-like looseness of skin around that from which the nurse prior to my examination had expressed some semipurulent fluid, which was appropriately cultured.  This tiny area does not definitely probe to bone, but is of some concern in that regard because of its pinpoint nature and lack of healing over such an extended period of time.  IMPRESSION:  Diabetic foot ulcer, traumatically generated, Wagner II or possibly III of the left second toe.  DISPOSITION: 1. The loose skin described above is debrided away and the underlying     area looks quite satisfactory except for the small open wound,     which measures 0.3 x 0.3 cm and is approximately 0.3 cm in depth as     well without definite extension to bone.  The wound is cultured as     indicated above.  In addition, an x-ray is scheduled and it is     explained to the patient and her friend that if this is clearly     quite normal, we will feel reassured.  If it is clearly abnormal,     we may not need anything further.  If it is equivocal, we will     likely need to follow this with some type of scan.  MRI is in     question because she does have some elements of hardware apparently     several places in her body. 2. She has completed her cephalexin and we have asked her to stay off     any antibiotics whatsoever until the results of the current culture     are known.  I will be back in this clinic in 2 days and should be     able to follow up on that at that time, notifying the patient if     there is a need for antibiotic  reinstitution. 3. The wound is dressed with Iodosorb and toe sock type dressing and     she is given a tiny amount of this to have her friend re-dress the     toe 3 times weekly.  I am reluctant in giving her prescription for     this expensive drug     because of her question of sensitivity of contrast media and this     being iodine (albeit Cadexomer).  I was allowed to commit her to     such an expensive medication until her tolerance is established. 4. Followup visit will be  here in 1 week, sooner if there are     problems.          ______________________________ Jonelle Sports Cheryll Cockayne, M.D.     RES/MEDQ  D:  04/12/2011  T:  04/13/2011  Job:  [id number]

## 2011-05-04 ENCOUNTER — Encounter (HOSPITAL_BASED_OUTPATIENT_CLINIC_OR_DEPARTMENT_OTHER): Payer: Medicare Other

## 2011-05-10 ENCOUNTER — Encounter (HOSPITAL_BASED_OUTPATIENT_CLINIC_OR_DEPARTMENT_OTHER): Payer: Medicare Other | Attending: Internal Medicine

## 2011-05-10 DIAGNOSIS — I10 Essential (primary) hypertension: Secondary | ICD-10-CM | POA: Insufficient documentation

## 2011-05-10 DIAGNOSIS — M069 Rheumatoid arthritis, unspecified: Secondary | ICD-10-CM | POA: Insufficient documentation

## 2011-05-10 DIAGNOSIS — M2040 Other hammer toe(s) (acquired), unspecified foot: Secondary | ICD-10-CM | POA: Insufficient documentation

## 2011-05-10 DIAGNOSIS — Z79899 Other long term (current) drug therapy: Secondary | ICD-10-CM | POA: Insufficient documentation

## 2011-05-10 DIAGNOSIS — L97509 Non-pressure chronic ulcer of other part of unspecified foot with unspecified severity: Secondary | ICD-10-CM | POA: Insufficient documentation

## 2011-05-10 DIAGNOSIS — J984 Other disorders of lung: Secondary | ICD-10-CM | POA: Insufficient documentation

## 2011-05-10 DIAGNOSIS — I509 Heart failure, unspecified: Secondary | ICD-10-CM | POA: Insufficient documentation

## 2011-05-10 DIAGNOSIS — E1169 Type 2 diabetes mellitus with other specified complication: Secondary | ICD-10-CM | POA: Insufficient documentation

## 2011-05-10 DIAGNOSIS — Z7901 Long term (current) use of anticoagulants: Secondary | ICD-10-CM | POA: Insufficient documentation

## 2011-06-14 ENCOUNTER — Telehealth: Payer: Self-pay | Admitting: Pulmonary Disease

## 2011-06-14 NOTE — Telephone Encounter (Signed)
Let me see her and decide what to do.  We may not do a cxr depending upon the visit.

## 2011-06-14 NOTE — Telephone Encounter (Signed)
I spoke with pt and is aware of kc response. She voiced her understanding and needed nothing further

## 2011-06-14 NOTE — Telephone Encounter (Signed)
I spoke with pt and she states she is scheduled to see [facility] on 06/18/11 and is suppose to have cxr done at that time. She states she is not able to do this bc she can't stand up bc she is in a wheel chair and states the people downstairs can never do a cxr on her. Pt is wanting to know what KC would like her to cancel the cxr or what he would like to do.  Please advise KC thanks

## 2011-06-18 ENCOUNTER — Encounter: Payer: Self-pay | Admitting: Pulmonary Disease

## 2011-06-18 ENCOUNTER — Ambulatory Visit (INDEPENDENT_AMBULATORY_CARE_PROVIDER_SITE_OTHER): Payer: Medicare Other | Admitting: Pulmonary Disease

## 2011-06-18 VITALS — BP 132/80 | HR 73 | Temp 98.2°F

## 2011-06-18 DIAGNOSIS — J8410 Pulmonary fibrosis, unspecified: Secondary | ICD-10-CM

## 2011-06-18 NOTE — Progress Notes (Signed)
  Subjective:    Patient ID: Holly [name], female    DOB: 15-Feb-1931, 78 y.o.   MRN: [id number]  HPI Patient comes in today for followup of her known interstitial lung disease.  This is felt to be secondary to her underlying autoimmune process, although idiopathic pulmonary fibrosis cannot be excluded.  We have been following her closely, and she has not had significant progression of her disease based on symptoms, x-rays, and pulmonary function studies.  She sees a rheumatologist regularly who treats her with Plaquenil.  The patient denies any change in her dyspnea on exertion from the last visit, and has no cough.  She has had lower extremity venous Dopplers, with no evidence for DVT.   Review of Systems  Constitutional: Negative.  Negative for fever and unexpected weight change.  HENT: Positive for congestion. Negative for ear pain, nosebleeds, sore throat, rhinorrhea, sneezing, trouble swallowing, dental problem, postnasal drip and sinus pressure.   Eyes: Negative.  Negative for redness and itching.  Respiratory: Negative.  Negative for cough, chest tightness, shortness of breath and wheezing.   Cardiovascular: Negative.  Negative for palpitations and leg swelling.  Gastrointestinal: Negative.  Negative for nausea and vomiting.  Genitourinary: Negative.  Negative for dysuria.  Musculoskeletal: Negative.  Negative for joint swelling.  Skin: Negative.  Negative for rash.  Neurological: Negative.  Negative for headaches.  Hematological: Negative.  Does not bruise/bleed easily.  Psychiatric/Behavioral: Negative.  Negative for dysphoric mood. The patient is not nervous/anxious.        Objective:   Physical Exam Morbidly obese female in no acute distress Nose without purulent discharge noted Chest with mild basilar crackles, adequate airflow, no wheezing Cardiac exam with regular rate and rhythm, 2/6 systolic murmur Lower extremities with severe edema on the right, and minimal on the left.   No cyanosis noted Alert and oriented, moves all 4 extremities.       Assessment & Plan:

## 2011-06-18 NOTE — Patient Instructions (Signed)
Will continue to monitor your pulmonary fibrosis Will send a note to your primary md and your rheumatologist. followup with me in 6mos, and will check cxr next visit.

## 2011-06-18 NOTE — Assessment & Plan Note (Signed)
The patient appears to be stable from a pulmonary perspective.  She is seen no worsening of her baseline dyspnea on exertion, and has no cough at this time.  Her lung exam is stable as well.  I would like to continue to follow her every 6 months.  If she is found to have progressive disease, her rheumatologist we'll need to decide whether she requires more aggressive immunosuppression.

## 2011-06-28 ENCOUNTER — Encounter (HOSPITAL_BASED_OUTPATIENT_CLINIC_OR_DEPARTMENT_OTHER): Payer: Medicare Other | Attending: Internal Medicine

## 2011-06-28 DIAGNOSIS — E1169 Type 2 diabetes mellitus with other specified complication: Secondary | ICD-10-CM | POA: Insufficient documentation

## 2011-06-28 DIAGNOSIS — M869 Osteomyelitis, unspecified: Secondary | ICD-10-CM | POA: Insufficient documentation

## 2011-06-28 DIAGNOSIS — I4891 Unspecified atrial fibrillation: Secondary | ICD-10-CM | POA: Insufficient documentation

## 2011-06-28 DIAGNOSIS — Z7901 Long term (current) use of anticoagulants: Secondary | ICD-10-CM | POA: Insufficient documentation

## 2011-06-28 DIAGNOSIS — M9080 Osteopathy in diseases classified elsewhere, unspecified site: Secondary | ICD-10-CM | POA: Insufficient documentation

## 2011-06-28 DIAGNOSIS — Z79899 Other long term (current) drug therapy: Secondary | ICD-10-CM | POA: Insufficient documentation

## 2011-06-28 DIAGNOSIS — I10 Essential (primary) hypertension: Secondary | ICD-10-CM | POA: Insufficient documentation

## 2011-06-28 DIAGNOSIS — L97509 Non-pressure chronic ulcer of other part of unspecified foot with unspecified severity: Secondary | ICD-10-CM | POA: Insufficient documentation

## 2011-07-03 IMAGING — CR DG CHEST 1V PORT
1 series · 1 of 1 positions shown · non-contrast
Comparison: 02/27/2008

CLINICAL DATA: Shortness of breath

PORTABLE CHEST - 1 VIEW

[view not recorded]
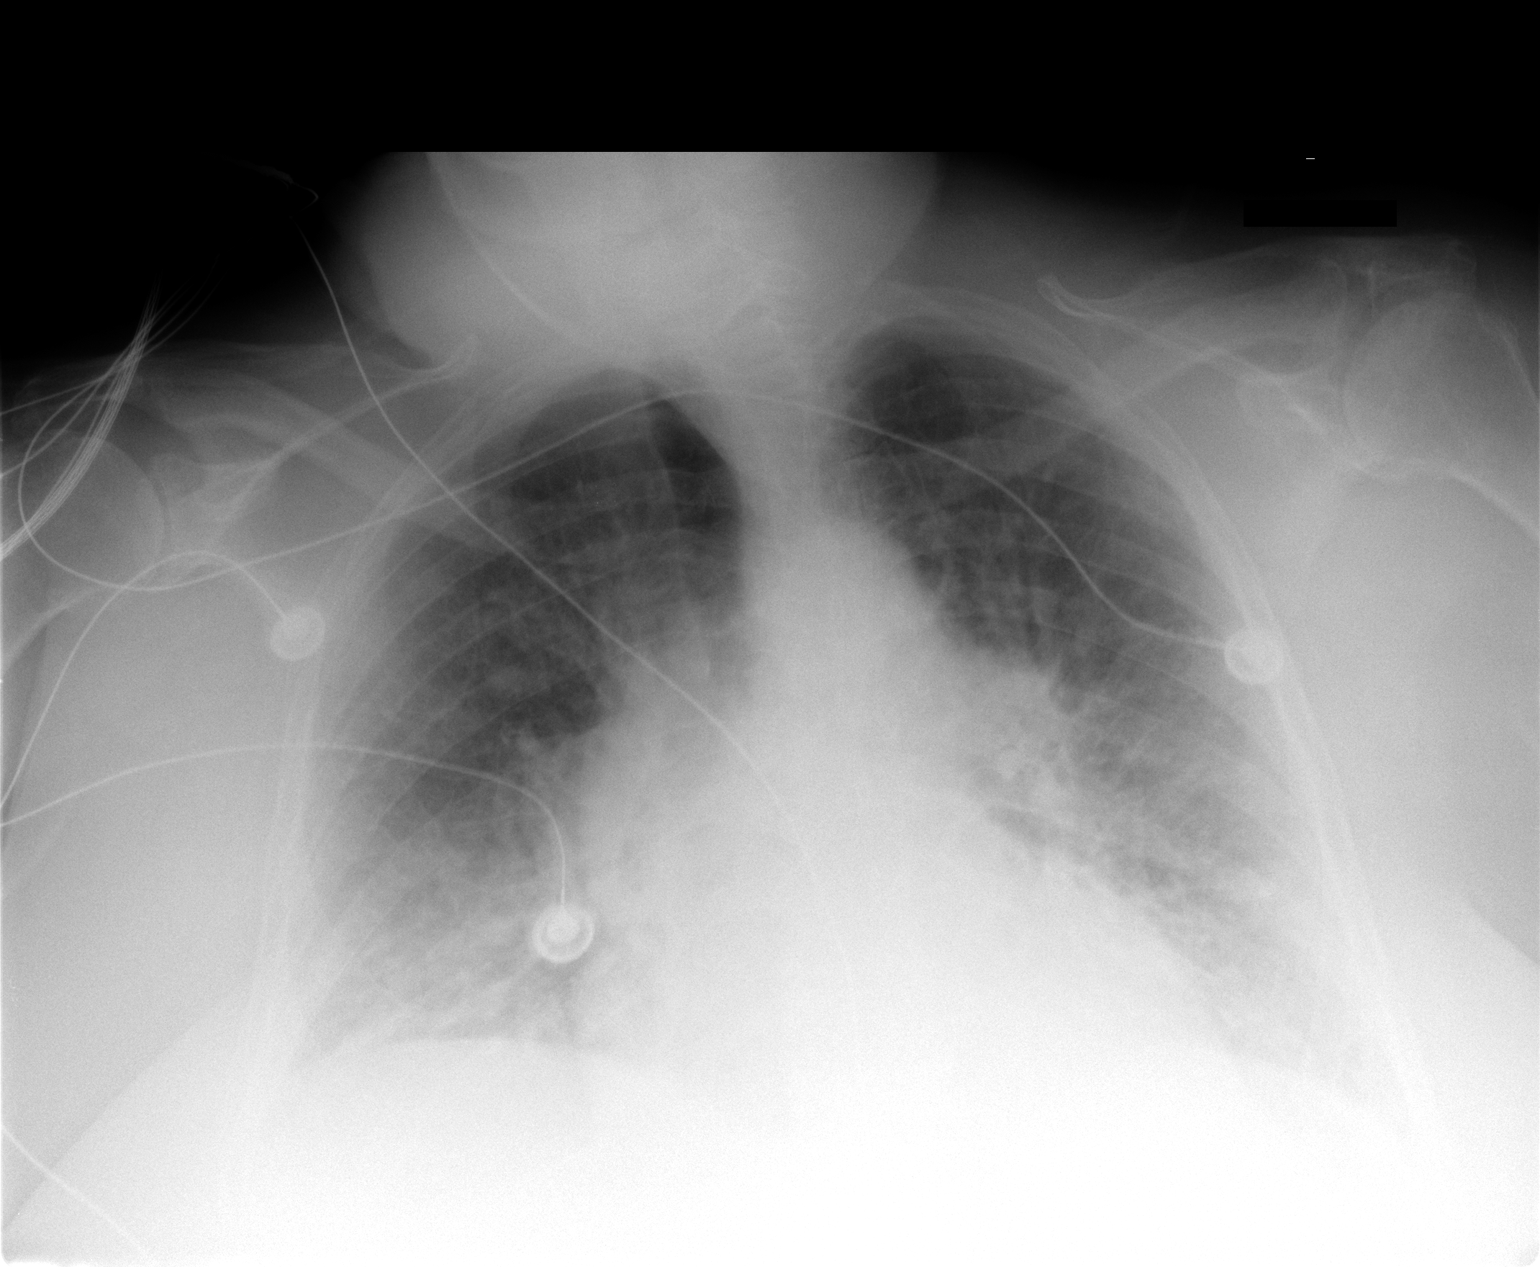

[1 of 1 positions shown; findings below may reference images not displayed]

FINDINGS: There is cardiomegaly with vascular congestion and
diffuse interstitial opacities, likely interstitial edema.  There
is bibasilar atelectasis.  No definite effusions.  No acute bony
abnormality.
IMPRESSION: Mild interstitial edema/CHF.

## 2011-07-26 ENCOUNTER — Encounter (HOSPITAL_BASED_OUTPATIENT_CLINIC_OR_DEPARTMENT_OTHER): Payer: Medicare Other | Attending: Internal Medicine

## 2011-07-26 DIAGNOSIS — I10 Essential (primary) hypertension: Secondary | ICD-10-CM | POA: Insufficient documentation

## 2011-07-26 DIAGNOSIS — I4891 Unspecified atrial fibrillation: Secondary | ICD-10-CM | POA: Insufficient documentation

## 2011-07-26 DIAGNOSIS — Z7901 Long term (current) use of anticoagulants: Secondary | ICD-10-CM | POA: Insufficient documentation

## 2011-07-26 DIAGNOSIS — M9080 Osteopathy in diseases classified elsewhere, unspecified site: Secondary | ICD-10-CM | POA: Insufficient documentation

## 2011-07-26 DIAGNOSIS — E1169 Type 2 diabetes mellitus with other specified complication: Secondary | ICD-10-CM | POA: Insufficient documentation

## 2011-07-26 DIAGNOSIS — Z79899 Other long term (current) drug therapy: Secondary | ICD-10-CM | POA: Insufficient documentation

## 2011-07-26 DIAGNOSIS — M869 Osteomyelitis, unspecified: Secondary | ICD-10-CM | POA: Insufficient documentation

## 2011-07-26 DIAGNOSIS — L97509 Non-pressure chronic ulcer of other part of unspecified foot with unspecified severity: Secondary | ICD-10-CM | POA: Insufficient documentation

## 2011-08-20 ENCOUNTER — Encounter (HOSPITAL_BASED_OUTPATIENT_CLINIC_OR_DEPARTMENT_OTHER): Payer: Medicare Other | Attending: General Surgery

## 2011-08-20 DIAGNOSIS — Z79899 Other long term (current) drug therapy: Secondary | ICD-10-CM | POA: Insufficient documentation

## 2011-08-20 DIAGNOSIS — E119 Type 2 diabetes mellitus without complications: Secondary | ICD-10-CM | POA: Insufficient documentation

## 2011-08-20 DIAGNOSIS — L89899 Pressure ulcer of other site, unspecified stage: Secondary | ICD-10-CM | POA: Insufficient documentation

## 2011-08-20 DIAGNOSIS — L8990 Pressure ulcer of unspecified site, unspecified stage: Secondary | ICD-10-CM | POA: Insufficient documentation

## 2011-08-20 DIAGNOSIS — I10 Essential (primary) hypertension: Secondary | ICD-10-CM | POA: Insufficient documentation

## 2011-09-10 ENCOUNTER — Encounter (HOSPITAL_BASED_OUTPATIENT_CLINIC_OR_DEPARTMENT_OTHER): Payer: Medicare Other | Attending: General Surgery

## 2011-09-10 DIAGNOSIS — L8990 Pressure ulcer of unspecified site, unspecified stage: Secondary | ICD-10-CM | POA: Insufficient documentation

## 2011-09-10 DIAGNOSIS — L89899 Pressure ulcer of other site, unspecified stage: Secondary | ICD-10-CM | POA: Insufficient documentation

## 2011-09-17 ENCOUNTER — Encounter (HOSPITAL_BASED_OUTPATIENT_CLINIC_OR_DEPARTMENT_OTHER): Payer: Medicare Other

## 2011-10-24 IMAGING — CR DG CHEST 1V
1 series · 1 of 1 positions shown · non-contrast
Comparison: 01/29/2009.

CLINICAL DATA: Interstitial lung disease.  Hypertension.  Previous
smoker.

CHEST - 1 VIEW

[view not recorded]
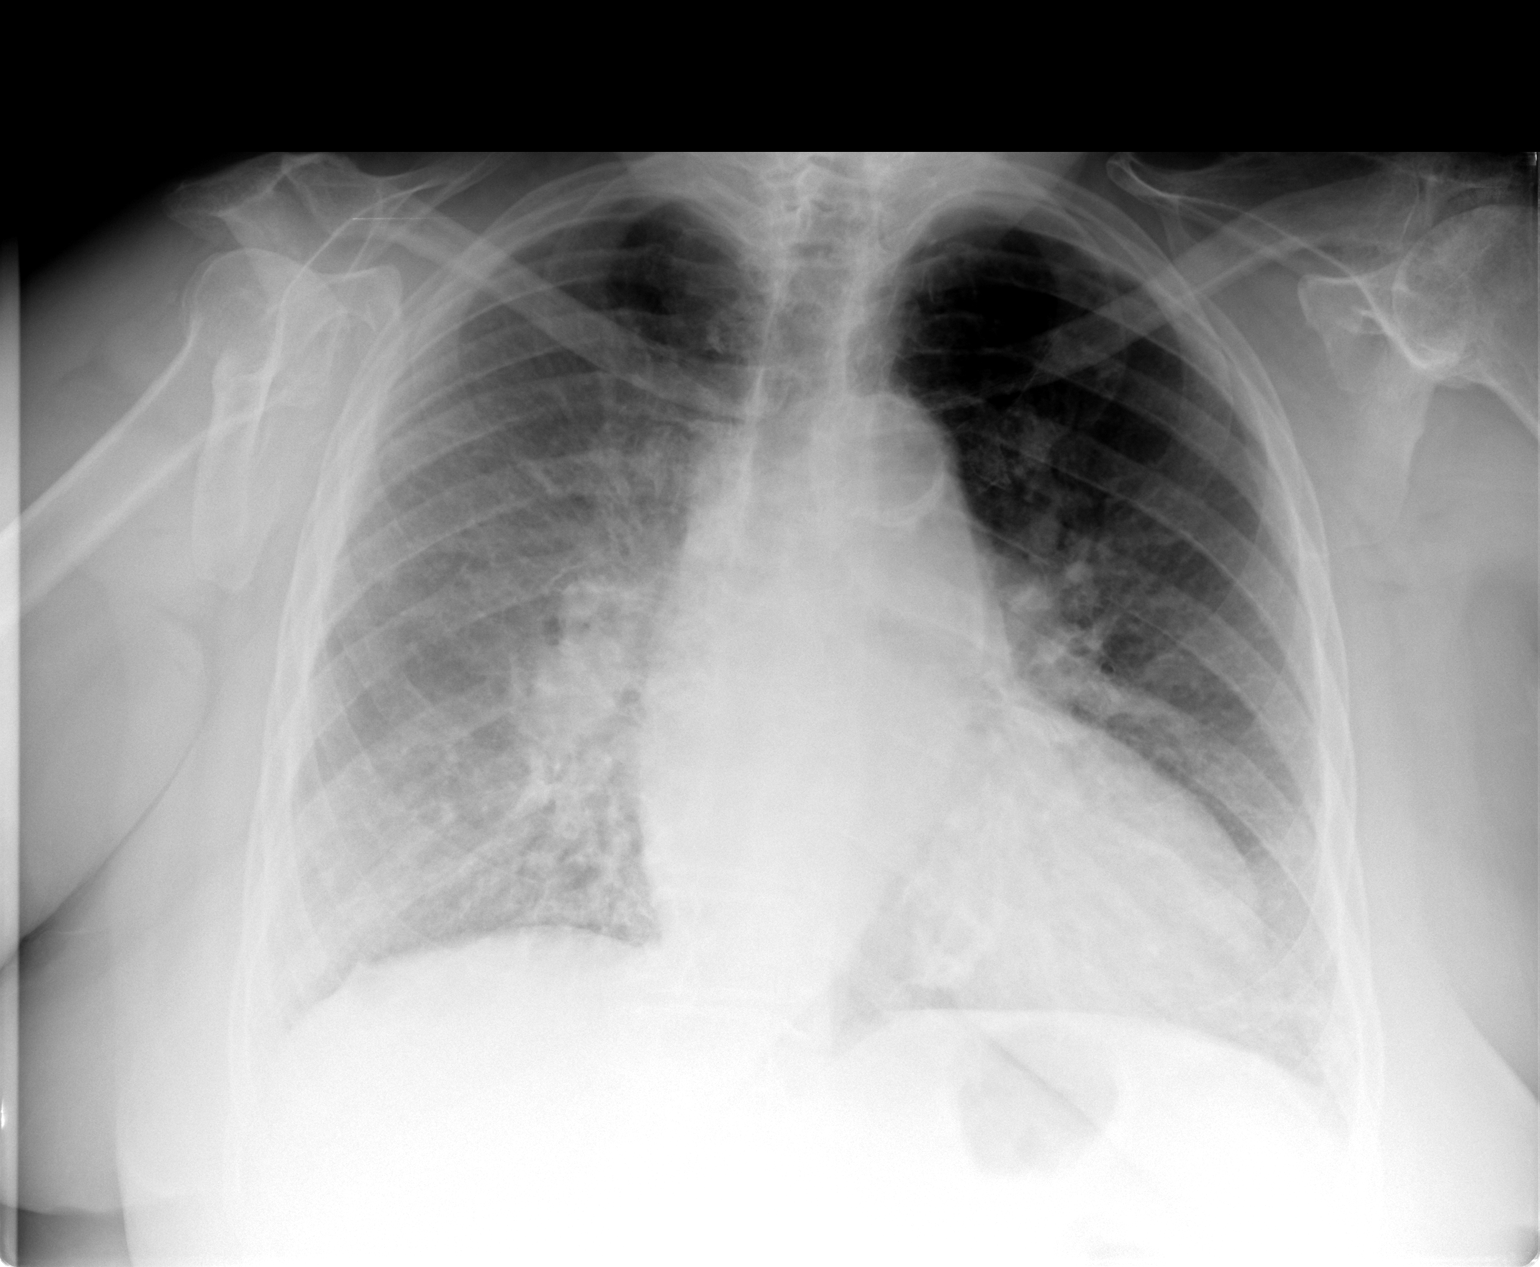

[1 of 1 positions shown; findings below may reference images not displayed]

FINDINGS: Mild cardiomegaly.  Tortuous ectatic thoracic aorta.
Accentuated interstitial lung markings may be due to interstitial
edema.  No pleural effusions.
IMPRESSION: Interstitial lung disease.  Query interstitial edema.

## 2011-11-04 ENCOUNTER — Other Ambulatory Visit: Payer: Self-pay | Admitting: Cardiology

## 2011-11-04 DIAGNOSIS — R51 Headache: Secondary | ICD-10-CM

## 2011-11-09 ENCOUNTER — Other Ambulatory Visit: Payer: Medicare Other

## 2011-11-11 ENCOUNTER — Ambulatory Visit
Admission: RE | Admit: 2011-11-11 | Discharge: 2011-11-11 | Disposition: A | Discharge status: Home / Self Care | Payer: Medicare Other | Source: Ambulatory Visit | Attending: Cardiology | Admitting: Cardiology

## 2011-11-11 DIAGNOSIS — R51 Headache: Secondary | ICD-10-CM

## 2011-12-14 ENCOUNTER — Telehealth: Payer: Self-pay | Admitting: Pulmonary Disease

## 2011-12-14 DIAGNOSIS — J849 Interstitial pulmonary disease, unspecified: Secondary | ICD-10-CM

## 2011-12-14 NOTE — Telephone Encounter (Signed)
Pt returned call from triage. Holly [name] °

## 2011-12-14 NOTE — Telephone Encounter (Signed)
LMOMTCB in regards to cxr needed before f/u with KC.  Order has been placed for cxr to be completed at [facility] prior to appt since patient in wheelchair. Patient needs to be made aware of this when she calls back.

## 2011-12-15 NOTE — Telephone Encounter (Signed)
Error.  Duplicate message.  Holly [name] °- °

## 2011-12-20 ENCOUNTER — Ambulatory Visit (INDEPENDENT_AMBULATORY_CARE_PROVIDER_SITE_OTHER): Payer: Medicare Other | Admitting: Pulmonary Disease

## 2011-12-20 ENCOUNTER — Encounter: Payer: Self-pay | Admitting: Pulmonary Disease

## 2011-12-20 ENCOUNTER — Ambulatory Visit (HOSPITAL_COMMUNITY)
Admission: RE | Admit: 2011-12-20 | Discharge: 2011-12-20 | Disposition: A | Discharge status: Home / Self Care | Payer: Medicare Other | Source: Ambulatory Visit | Attending: Pulmonary Disease | Admitting: Pulmonary Disease

## 2011-12-20 VITALS — BP 128/88 | HR 55 | Temp 98.1°F | Ht 60.0 in

## 2011-12-20 DIAGNOSIS — J849 Interstitial pulmonary disease, unspecified: Secondary | ICD-10-CM

## 2011-12-20 DIAGNOSIS — I517 Cardiomegaly: Secondary | ICD-10-CM | POA: Insufficient documentation

## 2011-12-20 DIAGNOSIS — M069 Rheumatoid arthritis, unspecified: Secondary | ICD-10-CM | POA: Insufficient documentation

## 2011-12-20 DIAGNOSIS — J8410 Pulmonary fibrosis, unspecified: Secondary | ICD-10-CM | POA: Insufficient documentation

## 2011-12-20 DIAGNOSIS — Z9119 Patient's noncompliance with other medical treatment and regimen: Secondary | ICD-10-CM | POA: Insufficient documentation

## 2011-12-20 DIAGNOSIS — Z7982 Long term (current) use of aspirin: Secondary | ICD-10-CM | POA: Insufficient documentation

## 2011-12-20 DIAGNOSIS — Z91199 Patient's noncompliance with other medical treatment and regimen due to unspecified reason: Secondary | ICD-10-CM | POA: Insufficient documentation

## 2011-12-20 NOTE — Assessment & Plan Note (Signed)
The pt's cxr shows no worsening of her ISLD, but does show increased vascular congestion.  The pt tells me that she has not been taking her diuretic on a consistent basis.  I would be hesitant to treat her autoimmune disease/ISLD more aggressively given her age and limited mobility.  She does need more aggressive treatment of her volume status.  Will send a note to her primary md about this.

## 2011-12-20 NOTE — Progress Notes (Signed)
  Subjective:    Patient ID: Holly [name], female    DOB: [birth year], 78 y.o.   MRN: [id number]  HPI The patient comes in today for followup of her known interstitial lung disease, possibly secondary to her history of rheumatoid arthritis.  We have not treated her more aggressively because of the stability of her chest x-ray and pulmonary function studies.  She comes in today where she has had a little more shortness of breath, but her chest x-ray shows increased vascular congestion and cardiomegaly.  She admits that she has not been taking her diuretic on a consistent basis.  Her x-ray does not show any worsening of her interstitial disease.  She has a little more nasal congestion with postnasal drip and cough, and I have recommended a nasal hygiene regimen for her.  I also note that she is on an ACE inhibitor, and this may have to be discontinued if her chronic cough continues.   Review of Systems  Constitutional: Negative for fever and unexpected weight change.  HENT: Positive for congestion, rhinorrhea, sneezing and sinus pressure. Negative for ear pain, nosebleeds, sore throat, trouble swallowing, dental problem and postnasal drip.   Eyes: Positive for itching. Negative for redness.  Respiratory: Positive for cough, shortness of breath and wheezing. Negative for chest tightness.   Cardiovascular: Positive for leg swelling. Negative for palpitations.  Gastrointestinal: Negative for nausea and vomiting.  Genitourinary: Negative for dysuria.  Musculoskeletal: Positive for gait problem. Negative for joint swelling.  Skin: Negative for rash.  Neurological: Positive for headaches.  Hematological: Bruises/bleeds easily.  Psychiatric/Behavioral: Negative for dysphoric mood. The patient is not nervous/anxious.        Objective:   Physical Exam Obese female in no acute distress Nose without purulence or discharge noted Oropharynx clear Chest with basilar crackles, no wheezes or  rhonchi Cardiac exam with regular rate and rhythm Lower extremities with 2+ edema right worse than left, no cyanosis Alert and oriented, moves all 4 extremities.       Assessment & Plan:

## 2011-12-20 NOTE — Patient Instructions (Addendum)
No change in your treatment from a pulmonary standpoint. Would try zyrtec 10mg  otc each day for nasal symptoms, and can also use nasal saline spray for congestion If your upper airway cough continues despite treating nasal symptoms, would talk with Dr. Zachery Dauer about stopping lisinopril as a trial You need to get back on your scheduled dose of diuretic, since your xray shows too much fluid on board. Will see you back in 6mos to see how things are going.

## 2012-06-19 ENCOUNTER — Encounter: Payer: Self-pay | Admitting: Pulmonary Disease

## 2012-06-19 ENCOUNTER — Ambulatory Visit (INDEPENDENT_AMBULATORY_CARE_PROVIDER_SITE_OTHER): Payer: Medicare Other | Admitting: Pulmonary Disease

## 2012-06-19 VITALS — BP 112/64 | HR 70 | Temp 97.8°F

## 2012-06-19 DIAGNOSIS — J8410 Pulmonary fibrosis, unspecified: Secondary | ICD-10-CM

## 2012-06-19 NOTE — Patient Instructions (Addendum)
Will need to stay on your diuretics consistently to see if your heart failure is the reason for your increased shortness of breath.  Would get with Dr. Zachery Dauer to treat this as aggressively as possible.   Once it is felt that your fluid status is optimized, and if you are still more short of breath from baseline, will need to do breathing tests and a followup scan of your chest.  Let me know and I can order. Please make an apptm to followup with me in 6mos.

## 2012-06-19 NOTE — Assessment & Plan Note (Signed)
The patient is having increased shortness of breath since her last visit, but she is unsure how much of this is related to her lung disease versus heart failure.  She has not been completely compliant with her diuretic regimen, and is unable to weigh to give [place] some kind of baseline on her fluid status.  She has crackles on exam, but would have this with her interstitial disease or with edema.  We can evaluate her interstitial disease with a followup CT chest and PFTs, but I would like to try and get her as dry as possible before doing this.  I have recommended to the patient to get back on her diuretics compliantly, and also get with her primary physician to try and get her as dry as possible.  If she is still having shortness of breath above her baseline, we could then focus on doing a followup high-resolution CT and PFTs.  However, she is not a very good candidate for aggressive immunosuppressive therapy such as Imuran or CellCept.

## 2012-06-19 NOTE — Progress Notes (Signed)
  Subjective:    Patient ID: Holly [name], female    DOB: 01-08-1932, 79 y.o.   MRN: [id number]  HPI The patient comes in today for followup of her known interstitial lung disease, possibly associated with her rheumatoid arthritis.  She also has underlying congestive heart failure, and unfortunately has been noncompliant with her diuretic regimen.  She really has multifactorial dyspnea, related to her interstitial disease, congestive heart failure, morbid obesity, and deconditioning and debility.  She still has some cough, but continues on her ACE inhibitor.  She denies any mucus production.   Review of Systems  Constitutional: Negative for fever and unexpected weight change.  HENT: Positive for congestion, rhinorrhea and postnasal drip. Negative for ear pain, nosebleeds, sore throat, sneezing, trouble swallowing, dental problem and sinus pressure.   Eyes: Negative for redness and itching.  Respiratory: Positive for shortness of breath. Negative for cough, chest tightness and wheezing.   Cardiovascular: Negative for palpitations and leg swelling.  Gastrointestinal: Negative for nausea and vomiting.  Genitourinary: Negative for dysuria.  Musculoskeletal: Negative for joint swelling.  Skin: Negative for rash.  Neurological: Negative for headaches.  Hematological: Does not bruise/bleed easily.  Psychiatric/Behavioral: Negative for dysphoric mood. The patient is not nervous/anxious.        Objective:   Physical Exam Morbidly obese female in no acute distress.  She is wheelchair-bound. Nose without purulence or discharge noted Neck without lymphadenopathy or thyromegaly Chest with crackles one third the way up bilaterally, no wheezing Cardiac exam with regular rate and rhythm Lower extremities with 3+ edema, right much greater than left.  No cyanosis noted Alert and oriented, moves all 4 extremities.       Assessment & Plan:

## 2012-07-07 ENCOUNTER — Inpatient Hospital Stay (HOSPITAL_COMMUNITY): Payer: Medicare Other

## 2012-07-07 ENCOUNTER — Emergency Department (HOSPITAL_COMMUNITY): Payer: Medicare Other

## 2012-07-07 ENCOUNTER — Encounter (HOSPITAL_COMMUNITY): Payer: Self-pay | Admitting: Emergency Medicine

## 2012-07-07 ENCOUNTER — Inpatient Hospital Stay (HOSPITAL_COMMUNITY)
Admission: EM | Admit: 2012-07-07 | Discharge: 2012-07-17 | DRG: 682 | Disposition: A | Discharge status: Home / Self Care | Payer: Medicare Other | Attending: Internal Medicine | Admitting: Internal Medicine

## 2012-07-07 DIAGNOSIS — N170 Acute kidney failure with tubular necrosis: Principal | ICD-10-CM | POA: Diagnosis present

## 2012-07-07 DIAGNOSIS — R21 Rash and other nonspecific skin eruption: Secondary | ICD-10-CM | POA: Diagnosis present

## 2012-07-07 DIAGNOSIS — K2970 Gastritis, unspecified, without bleeding: Secondary | ICD-10-CM | POA: Diagnosis present

## 2012-07-07 DIAGNOSIS — T424X5A Adverse effect of benzodiazepines, initial encounter: Secondary | ICD-10-CM | POA: Diagnosis not present

## 2012-07-07 DIAGNOSIS — M1990 Unspecified osteoarthritis, unspecified site: Secondary | ICD-10-CM | POA: Diagnosis present

## 2012-07-07 DIAGNOSIS — I129 Hypertensive chronic kidney disease with stage 1 through stage 4 chronic kidney disease, or unspecified chronic kidney disease: Secondary | ICD-10-CM | POA: Diagnosis present

## 2012-07-07 DIAGNOSIS — G929 Unspecified toxic encephalopathy: Secondary | ICD-10-CM | POA: Diagnosis not present

## 2012-07-07 DIAGNOSIS — R0902 Hypoxemia: Secondary | ICD-10-CM | POA: Diagnosis present

## 2012-07-07 DIAGNOSIS — I079 Rheumatic tricuspid valve disease, unspecified: Secondary | ICD-10-CM | POA: Diagnosis present

## 2012-07-07 DIAGNOSIS — K209 Esophagitis, unspecified without bleeding: Secondary | ICD-10-CM | POA: Diagnosis present

## 2012-07-07 DIAGNOSIS — K7460 Unspecified cirrhosis of liver: Secondary | ICD-10-CM | POA: Diagnosis present

## 2012-07-07 DIAGNOSIS — N390 Urinary tract infection, site not specified: Secondary | ICD-10-CM | POA: Diagnosis present

## 2012-07-07 DIAGNOSIS — N289 Disorder of kidney and ureter, unspecified: Secondary | ICD-10-CM

## 2012-07-07 DIAGNOSIS — I4891 Unspecified atrial fibrillation: Secondary | ICD-10-CM | POA: Diagnosis present

## 2012-07-07 DIAGNOSIS — D696 Thrombocytopenia, unspecified: Secondary | ICD-10-CM | POA: Diagnosis present

## 2012-07-07 DIAGNOSIS — N179 Acute kidney failure, unspecified: Secondary | ICD-10-CM

## 2012-07-07 DIAGNOSIS — M069 Rheumatoid arthritis, unspecified: Secondary | ICD-10-CM | POA: Diagnosis present

## 2012-07-07 DIAGNOSIS — K5900 Constipation, unspecified: Secondary | ICD-10-CM | POA: Diagnosis present

## 2012-07-07 DIAGNOSIS — K219 Gastro-esophageal reflux disease without esophagitis: Secondary | ICD-10-CM | POA: Diagnosis present

## 2012-07-07 DIAGNOSIS — IMO0002 Reserved for concepts with insufficient information to code with codable children: Secondary | ICD-10-CM | POA: Diagnosis present

## 2012-07-07 DIAGNOSIS — Z7982 Long term (current) use of aspirin: Secondary | ICD-10-CM

## 2012-07-07 DIAGNOSIS — Z87891 Personal history of nicotine dependence: Secondary | ICD-10-CM

## 2012-07-07 DIAGNOSIS — Z79899 Other long term (current) drug therapy: Secondary | ICD-10-CM

## 2012-07-07 DIAGNOSIS — G92 Toxic encephalopathy: Secondary | ICD-10-CM | POA: Diagnosis not present

## 2012-07-07 DIAGNOSIS — G4733 Obstructive sleep apnea (adult) (pediatric): Secondary | ICD-10-CM

## 2012-07-07 DIAGNOSIS — I2789 Other specified pulmonary heart diseases: Secondary | ICD-10-CM

## 2012-07-07 DIAGNOSIS — A5211 Tabes dorsalis: Secondary | ICD-10-CM | POA: Diagnosis present

## 2012-07-07 DIAGNOSIS — E119 Type 2 diabetes mellitus without complications: Secondary | ICD-10-CM

## 2012-07-07 DIAGNOSIS — I5032 Chronic diastolic (congestive) heart failure: Secondary | ICD-10-CM | POA: Diagnosis present

## 2012-07-07 DIAGNOSIS — K3184 Gastroparesis: Secondary | ICD-10-CM | POA: Diagnosis present

## 2012-07-07 DIAGNOSIS — J8410 Pulmonary fibrosis, unspecified: Secondary | ICD-10-CM | POA: Diagnosis present

## 2012-07-07 DIAGNOSIS — Z515 Encounter for palliative care: Secondary | ICD-10-CM

## 2012-07-07 DIAGNOSIS — N189 Chronic kidney disease, unspecified: Secondary | ICD-10-CM | POA: Diagnosis present

## 2012-07-07 DIAGNOSIS — T40605A Adverse effect of unspecified narcotics, initial encounter: Secondary | ICD-10-CM | POA: Diagnosis not present

## 2012-07-07 DIAGNOSIS — E1149 Type 2 diabetes mellitus with other diabetic neurological complication: Secondary | ICD-10-CM | POA: Diagnosis present

## 2012-07-07 DIAGNOSIS — A498 Other bacterial infections of unspecified site: Secondary | ICD-10-CM | POA: Diagnosis present

## 2012-07-07 DIAGNOSIS — I509 Heart failure, unspecified: Secondary | ICD-10-CM | POA: Diagnosis present

## 2012-07-07 DIAGNOSIS — I272 Pulmonary hypertension, unspecified: Secondary | ICD-10-CM

## 2012-07-07 DIAGNOSIS — I2510 Atherosclerotic heart disease of native coronary artery without angina pectoris: Secondary | ICD-10-CM | POA: Diagnosis present

## 2012-07-07 DIAGNOSIS — IMO0001 Reserved for inherently not codable concepts without codable children: Secondary | ICD-10-CM

## 2012-07-07 DIAGNOSIS — Z481 Encounter for planned postprocedural wound closure: Secondary | ICD-10-CM

## 2012-07-07 DIAGNOSIS — Z993 Dependence on wheelchair: Secondary | ICD-10-CM

## 2012-07-07 DIAGNOSIS — R609 Edema, unspecified: Secondary | ICD-10-CM

## 2012-07-07 DIAGNOSIS — J690 Pneumonitis due to inhalation of food and vomit: Secondary | ICD-10-CM | POA: Diagnosis present

## 2012-07-07 DIAGNOSIS — Z7901 Long term (current) use of anticoagulants: Secondary | ICD-10-CM

## 2012-07-07 DIAGNOSIS — B370 Candidal stomatitis: Secondary | ICD-10-CM | POA: Diagnosis present

## 2012-07-07 DIAGNOSIS — G2581 Restless legs syndrome: Secondary | ICD-10-CM | POA: Diagnosis present

## 2012-07-07 DIAGNOSIS — R188 Other ascites: Secondary | ICD-10-CM | POA: Diagnosis present

## 2012-07-07 DIAGNOSIS — J9600 Acute respiratory failure, unspecified whether with hypoxia or hypercapnia: Secondary | ICD-10-CM | POA: Diagnosis present

## 2012-07-07 HISTORY — DX: Chronic kidney disease, unspecified: N18.9

## 2012-07-07 HISTORY — DX: Thrombocytopenia, unspecified: D69.6

## 2012-07-07 LAB — PRO B NATRIURETIC PEPTIDE: Pro B Natriuretic peptide (BNP): 21555 pg/mL — ABNORMAL HIGH (ref 0–450)

## 2012-07-07 LAB — PROTIME-INR: Prothrombin Time: 28.1 seconds — ABNORMAL HIGH (ref 11.6–15.2)

## 2012-07-07 LAB — CBC WITH DIFFERENTIAL/PLATELET
Basophils Absolute: 0 10*3/uL (ref 0.0–0.1)
Eosinophils Absolute: 0 10*3/uL (ref 0.0–0.7)
HCT: 34.8 % — ABNORMAL LOW (ref 36.0–46.0)
Lymphocytes Relative: 11 % — ABNORMAL LOW (ref 12–46)
MCHC: 31.9 g/dL (ref 30.0–36.0)
Monocytes Relative: 10 % (ref 3–12)
Neutrophils Relative %: 79 % — ABNORMAL HIGH (ref 43–77)
Platelets: 101 10*3/uL — ABNORMAL LOW (ref 150–400)
RDW: 19.5 % — ABNORMAL HIGH (ref 11.5–15.5)
WBC: 7 10*3/uL (ref 4.0–10.5)

## 2012-07-07 LAB — BASIC METABOLIC PANEL
Calcium: 9.1 mg/dL (ref 8.4–10.5)
Creatinine, Ser: 2.45 mg/dL — ABNORMAL HIGH (ref 0.50–1.10)
GFR calc non Af Amer: 18 mL/min — ABNORMAL LOW (ref >90)
Sodium: 137 mEq/L (ref 135–145)

## 2012-07-07 LAB — URINE MICROSCOPIC-ADD ON

## 2012-07-07 LAB — URINALYSIS, ROUTINE W REFLEX MICROSCOPIC
Bilirubin Urine: NEGATIVE
Glucose, UA: NEGATIVE mg/dL
Hgb urine dipstick: NEGATIVE
Protein, ur: NEGATIVE mg/dL
Specific Gravity, Urine: 1.016 (ref 1.005–1.030)
Urobilinogen, UA: 0.2 mg/dL (ref 0.0–1.0)

## 2012-07-07 LAB — GLUCOSE, CAPILLARY
Glucose-Capillary: 65 mg/dL — ABNORMAL LOW (ref 70–99)
Glucose-Capillary: 118 mg/dL — ABNORMAL HIGH (ref 70–99)
Glucose-Capillary: 99 mg/dL (ref 70–99)

## 2012-07-07 MED ORDER — DOCUSATE SODIUM 100 MG PO CAPS
100.0000 mg | ORAL_CAPSULE | Freq: Two times a day (BID) | ORAL | Status: DC
  Administered 2012-07-07 – 2012-07-09 (×4): 100 mg via ORAL
  Filled 2012-07-07 (×7): qty 1

## 2012-07-07 MED ORDER — SIMVASTATIN 20 MG PO TABS
20.0000 mg | ORAL_TABLET | Freq: Every day | ORAL | Status: DC
  Administered 2012-07-07 – 2012-07-16 (×8): 20 mg via ORAL
  Filled 2012-07-07 (×11): qty 1

## 2012-07-07 MED ORDER — WARFARIN - PHARMACIST DOSING INPATIENT: Freq: Every day | Status: DC

## 2012-07-07 MED ORDER — ACETAMINOPHEN 325 MG PO TABS
650.0000 mg | ORAL_TABLET | Freq: Four times a day (QID) | ORAL | Status: DC | PRN
  Administered 2012-07-14 – 2012-07-16 (×2): 650 mg via ORAL
  Filled 2012-07-07 (×2): qty 2

## 2012-07-07 MED ORDER — HYDROCODONE-ACETAMINOPHEN 5-325 MG PO TABS
1.0000 | ORAL_TABLET | Freq: Four times a day (QID) | ORAL | Status: DC | PRN
  Administered 2012-07-08 (×2): 1 via ORAL
  Filled 2012-07-07 (×2): qty 1

## 2012-07-07 MED ORDER — ACETAMINOPHEN 650 MG RE SUPP: 650.0000 mg | Freq: Four times a day (QID) | RECTAL | Status: DC | PRN

## 2012-07-07 MED ORDER — MINERAL OIL RE ENEM
1.0000 | ENEMA | Freq: Once | RECTAL | Status: DC
  Filled 2012-07-07: qty 1

## 2012-07-07 MED ORDER — SODIUM CHLORIDE 0.9 % IV SOLN
INTRAVENOUS | Status: DC
  Administered 2012-07-07 – 2012-07-09 (×5): via INTRAVENOUS

## 2012-07-07 MED ORDER — INSULIN ASPART 100 UNIT/ML ~~LOC~~ SOLN: 0.0000 [IU] | Freq: Three times a day (TID) | SUBCUTANEOUS | Status: DC

## 2012-07-07 MED ORDER — SODIUM CHLORIDE 0.9 % IJ SOLN
3.0000 mL | Freq: Two times a day (BID) | INTRAMUSCULAR | Status: DC
  Administered 2012-07-08 – 2012-07-17 (×12): 3 mL via INTRAVENOUS

## 2012-07-07 MED ORDER — HYDROXYCHLOROQUINE SULFATE 200 MG PO TABS
200.0000 mg | ORAL_TABLET | Freq: Two times a day (BID) | ORAL | Status: DC
  Administered 2012-07-07 – 2012-07-09 (×4): 200 mg via ORAL
  Filled 2012-07-07 (×7): qty 1

## 2012-07-07 MED ORDER — DEXTROSE 5 % IV SOLN
1.0000 g | INTRAVENOUS | Status: DC
  Administered 2012-07-07 – 2012-07-09 (×3): 1 g via INTRAVENOUS
  Filled 2012-07-07 (×5): qty 10

## 2012-07-07 MED ORDER — PNEUMOCOCCAL VAC POLYVALENT 25 MCG/0.5ML IJ INJ
0.5000 mL | INJECTION | INTRAMUSCULAR | Status: AC
  Administered 2012-07-08: 0.5 mL via INTRAMUSCULAR
  Filled 2012-07-07 (×2): qty 0.5

## 2012-07-07 MED ORDER — METOPROLOL TARTRATE 50 MG PO TABS
50.0000 mg | ORAL_TABLET | Freq: Two times a day (BID) | ORAL | Status: DC
  Administered 2012-07-07: 50 mg via ORAL
  Filled 2012-07-07 (×3): qty 1

## 2012-07-07 MED ORDER — ASPIRIN EC 81 MG PO TBEC
81.0000 mg | DELAYED_RELEASE_TABLET | Freq: Every day | ORAL | Status: DC
  Administered 2012-07-07 – 2012-07-09 (×3): 81 mg via ORAL
  Filled 2012-07-07 (×4): qty 1

## 2012-07-07 MED ORDER — ROPINIROLE HCL 0.5 MG PO TABS
0.5000 mg | ORAL_TABLET | Freq: Two times a day (BID) | ORAL | Status: DC
  Administered 2012-07-07 – 2012-07-09 (×5): 0.5 mg via ORAL
  Filled 2012-07-07 (×7): qty 1

## 2012-07-07 MED ORDER — POLYETHYLENE GLYCOL 3350 17 G PO PACK
17.0000 g | PACK | Freq: Every day | ORAL | Status: DC
  Administered 2012-07-07 – 2012-07-09 (×3): 17 g via ORAL
  Filled 2012-07-07 (×4): qty 1

## 2012-07-07 MED ORDER — WARFARIN SODIUM 5 MG PO TABS
5.0000 mg | ORAL_TABLET | Freq: Once | ORAL | Status: AC
  Administered 2012-07-07: 5 mg via ORAL
  Filled 2012-07-07: qty 1

## 2012-07-07 NOTE — Care Management Note (Addendum)
    Page 1 of 2   07/17/2012     12:36:59 PM   CARE MANAGEMENT NOTE 07/17/2012  Patient:  Holly [name], Holly [name]   Account Number:  [id number]  Date Initiated:  07/07/2012  Documentation initiated by:  Lanier Clam  Subjective/Objective Assessment:   ADMITTED W/ABNORMAL LAB WORK.WORSENING KIDNEY FUNCTION.HX:DM,CHF,RA.     Action/Plan:   FROM HOME ALONE.HAS W/C,HOYER LIFT,HOME BIPAP.HAS PCP,PHARMACY.HAS PRIVATE SITTERS-[facility]-2HRS IN A-2HRS IN PM SAT-SUN.   Anticipated DC Date:  07/17/2012   Anticipated DC Plan:  HOME W HOSPICE CARE      DC Planning Services  CM consult      Choice offered to / List presented to:  C-1 Patient        HH arranged  HH-1 RN      [facility] agency  [facility]   Status of service:  Completed, signed off Medicare Important Message given?   (If response is "NO", the following Medicare IM given date fields will be blank) Date Medicare IM given:   Date Additional Medicare IM given:    Discharge Disposition:  HOME W HOSPICE CARE  Per UR Regulation:  Reviewed for med. necessity/level of care/duration of stay  If discussed at Long Length of Stay Meetings, dates discussed:    Comments:  07/17/12 Holly Szumski RN,BSN NCM [phone] SPOKE TO PATIENT ABOUT CHOICEFOR HOME HOSPICE.CHOSE [facility],Holly [name] AWARE OF CHOICE,& D/C,& DME.PATIENT ALREADY HAS HOME 02,HOSPITAL BED,PER MD WILL NEED NEBS.PATIENT DTR IN LAW WILL SET UP 24HR CAREGIVER SERVICE,& WILL STAY W/PATIENT @ HOME.PATIENT HAS OWN TRANSP.[facility] DME WIL LMAKE ARRANGEMENTS FOR DELIVERY OF DME WITH PATIENT/DTR IN LAW.MD UPDATED.  07/16/12 Holly Captain RN,BSN NCM WEEKEND [phone] REFERRAL FOR HOME HOSPICE CHOICE.PALLIATIVE CARE MEETING 6/6,& 6/7-HOME W/HOSPICE.PROVIDED W/HOME HOSPICE PROVIDER LIST.ALREADY HAS W/C,HOSPITAL BED,HOYERLIFT,BIPAP.HAS 24HR CAREGIVERS.HAS VAN FOR HOME TRANSP.  [id number] Holly Plater, RN, BSN, CCM: CHART REVIEWED AND UPDATED.  Next chart review due on [id number]. NO  DISCHARGE NEEDS PRESENT AT THIS TIME. CASE MANAGEMENT [phone]   [id number] Holly Plater, RN, BSN, CCM:  CHART REVIEWED AND UPDATED.  Next chart review due on [id number]. NO DISCHARGE NEEDS PRESENT AT THIS TIME. CASE MANAGEMENT [phone]   07/07/12 Holly Mcveigh RN,BSN NCM [phone] PATIENT IS COMFORTABLE W/HER PRIVATE SITTERS FROM [facility],& SINCE SHE IS W/C BOUND & UNDERSTANDS HOW TO MANAGE HER CARE,& WHEN TO CALL MD THINKS SHE WILL NOT NEED ANY HHC.SHE WILL CONTACT A FRIEND OR NEIGHBOR FOR TRANSP,BUT IF SHE CAN'T CONTACT ANYONE SHE MAY NEED TRANSP.

## 2012-07-07 NOTE — H&P (Addendum)
[facility] History and Physical  Holly [name] ZOX:[id number] DOB: 09/24/1931 DOA: 07/07/2012  Referring physician: Iantha Fallen.  PCP: [name], MD  Specialists: none.  Chief Complaint: Refer by PCP to ED because of abnormal lab work, renal function.   HPI: Holly [name] is a 79 y.o. female with PMH significant for RA, Diabetes, Diastolic Heart Failure, CAD, A fib on coumadin, CKD who was refer by PCP to the ED for evaluation of worsening  renal function. Patient saw her PCP day prior of admission because she was having problem with constipation and decrease urine out put. Patient last BM was 5 days prior to admission. Patient denies abdominal pain. She is passing flatus. She denies vomiting, does relates nausea. She denies dyspnea or chest pain. She has not been able to eat well.  I called PCP office, patient Cr on March 2014 was at 1.6, prior to that October 2013 at 1.3.   Review of Systems: Negative except as per HPI.   Past Medical History  Diagnosis Date  . Myalgia and myositis, unspecified   . Esophageal reflux   . Degeneration of intervertebral disc, site unspecified   . Morbid obesity   . Osteoarthrosis, unspecified whether generalized or localized, unspecified site   . Rheumatoid arthritis(714.0)   . Type II or unspecified type diabetes mellitus without mention of complication, not stated as uncontrolled   . CAD (coronary artery disease)   . Chronic kidney disease   . Thrombocytopenia    Past Surgical History  Procedure Laterality Date  . Appendectomy    . Rotator cuff repair    . Replacement total knee    . Tubal ligation    . Ankle fusion     Social History:  reports that she quit smoking about 35 years ago. Her smoking use included Cigarettes. She has a 17 pack-year smoking history. She has never used smokeless tobacco. She reports that she does not drink alcohol or use illicit drugs.She lives at home by herself, she has caregiver.    Allergies   Allergen Reactions  . Amoxicillin-Pot Clavulanate     Diarrhea ,fatigue mouth sores   . Doxycycline Hyclate   . Iodinated Diagnostic Agents     Full body rash   . Nitrofuran Derivatives   . Norvasc (Amlodipine Besylate)     Leg swelling  . Plavix (Clopidogrel Bisulfate)     REACTION: SOB, and rash  . Sulfa Antibiotics     And adhesive tape    Family History  Problem Relation Age of Onset  . Rheum arthritis Mother      Father had Wilson diseases.   Prior to Admission medications   Medication Sig Start Date End Date Taking? Authorizing Provider  aspirin EC 81 MG tablet Take 81 mg by mouth at bedtime.    Yes Historical Provider, MD  calcium-vitamin D (OSCAL WITH D) 500-200 MG-UNIT per tablet Take 1 tablet by mouth at bedtime.    Yes Historical Provider, MD  Coenzyme Q10 (COQ10) 100 MG CAPS Take 1 capsule by mouth 2 (two) times daily.   Yes Historical Provider, MD  furosemide (LASIX) 20 MG tablet Take 40 mg by mouth daily.    Yes Historical Provider, MD  HYDROcodone-acetaminophen (LORTAB) 10-500 MG per tablet Take 1 tablet by mouth 2 (two) times daily as needed.   Yes Historical Provider, MD  hydroxychloroquine (PLAQUENIL) 200 MG tablet Take 200 mg by mouth 2 (two) times daily.    Yes Historical Provider, MD  lisinopril (PRINIVIL,ZESTRIL) 10 MG tablet Take 5 mg by mouth daily.    Yes Historical Provider, MD  metoprolol (LOPRESSOR) 50 MG tablet Take 50 mg by mouth 2 (two) times daily.     Yes Historical Provider, MD  Multiple Vitamins-Minerals (CERTAVITE/ANTIOXIDANTS PO) Take 1 tablet by mouth daily.    Yes Historical Provider, MD  NON FORMULARY Ultimate eye support (6 berries) daily   Yes Historical Provider, MD  Omega-3 Fatty Acids (FISH OIL) 1000 MG CAPS Take 1 capsule by mouth 2 (two) times daily.    Yes Historical Provider, MD  rOPINIRole (REQUIP) 0.5 MG tablet Take 0.5 mg by mouth 2 (two) times daily.     Yes Historical Provider, MD  simvastatin (ZOCOR) 20 MG tablet Take 20 mg  by mouth at bedtime. 1 by mouth daily 06/16/11  Yes Historical Provider, MD  Vanadium 7.5 MG CAPS Take 7.5 mg by mouth daily. As directed   Yes Historical Provider, MD  warfarin (COUMADIN) 5 MG tablet 2.5-5 mg daily. Take 1 tab MWF, 1/2 tab all others 05/08/11  Yes Historical Provider, MD  ACCU-CHEK AVIVA PLUS test strip  12/04/11   Historical Provider, MD  Lancets (ACCU-CHEK MULTICLIX) lancets  12/04/11   Historical Provider, MD  ondansetron (ZOFRAN) 4 MG tablet Take 4 mg by mouth as needed.  11/22/11   Historical Provider, MD   Physical Exam: Filed Vitals:   07/07/12 1019 07/07/12 1025 07/07/12 1227  BP:  112/66 104/83  Pulse:  72 58  Temp:  97.5 F (36.4 C)   TempSrc:  Oral   Resp:  20 17  SpO2: 88% 99% 95%    General Appearance:    Alert, cooperative, no distress, appears stated age  Head:    Normocephalic, without obvious abnormality, atraumatic  Eyes:    PERRL, conjunctiva/corneas clear, EOM's intact,      Ears:    Normal TM's and external ear canals, both ears  Nose:   Nares normal, septum midline, mucosa normal, no drainage    or sinus tenderness  Throat:   Lips, mucosa, and tongue dry  Neck:   Supple, symmetrical, trachea midline, no adenopathy;    thyroid:  no enlargement/tenderness/nodules; no carotid   bruit or JVD  Back:     Symmetric, no curvature, ROM normal, no CVA tenderness  Lungs:     Crackles bases, respirations unlabored  Chest Wall:    No tenderness or deformity   Heart:    Regular rate and rhythm, S1 and S2 normal, no murmur, rub   or gallop     Abdomen:     Soft, non-tender, bowel sounds active all four quadrants,    no masses, no organomegaly        Extremities:   Extremities normal, atraumatic, no cyanosis. Plus 2 edema.   Pulses:   2+ and symmetric all extremities  Skin:   Skin color, texture, turgor normal, no rashes or lesions  Lymph nodes:   Cervical, supraclavicular, and axillary nodes normal  Neurologic:   CNII-XII intact, normal strength,  sensation and reflexes    throughout    Labs on Admission:  Basic Metabolic Panel:  Recent Labs Lab 07/07/12 1038  NA 137  K 5.1  CL 102  CO2 24  GLUCOSE 90  BUN 56*  CREATININE 2.45*  CALCIUM 9.1   Liver Function Tests: No results found for this basename: AST, ALT, ALKPHOS, BILITOT, PROT, ALBUMIN,  in the last 168 hours No results found for this basename: LIPASE,  AMYLASE,  in the last 168 hours No results found for this basename: AMMONIA,  in the last 168 hours CBC:  Recent Labs Lab 07/07/12 1038  WBC 7.0  NEUTROABS 5.5  HGB 11.1*  HCT 34.8*  MCV 85.5  PLT 101*   Cardiac Enzymes: No results found for this basename: CKTOTAL, CKMB, CKMBINDEX, TROPONINI,  in the last 168 hours  BNP (last 3 results)  Recent Labs  07/07/12 1038  PROBNP 21555.0*   CBG: No results found for this basename: GLUCAP,  in the last 168 hours  Radiological Exams on Admission: Dg Abd Acute W/chest  07/07/2012   *RADIOLOGY REPORT*  Clinical Data: Crampy abdominal pain.  Cough and chest congestion. Morbidly obese patient with history of coronary artery disease. Former smoker.  ACUTE ABDOMEN SERIES (ABDOMEN 2 VIEW & CHEST 1 VIEW)  Comparison: Two-view chest x-ray 12/20/2011, 10/20/2010.  No prior abdominal imaging.  Findings: Less than optimal examination due to body habitus.  Bowel gas pattern unremarkable without evidence of obstruction or significant ileus.  No evidence of free air or significant air fluid levels on the lateral decubitus image.  Edema in the subcutaneous tissues of the abdominal wall.  No visible opaque urinary tract calculi.  Severe degenerative changes involving the lumbar spine.  Cardiac silhouette enlarged but stable.  Thoracic aorta mildly tortuous and atherosclerotic, unchanged.  Hilar and mediastinal contours otherwise unremarkable.  Low lung volumes due to body habitus with crowded bronchovascular markings diffusely.  Lungs otherwise clear.  Mitral annular calcification.   IMPRESSION:  1.  No acute abdominal abnormality. 2.  Stable cardiomegaly.  Suboptimal inspiration.  No acute cardiopulmonary disease.   Original Report Authenticated By: Hulan Saas, M.D.      Assessment/Plan Active Problems:   * No active hospital problems. *  1-Acute on chronic Renal Failure:  Probably pre renal (ATN)  in setting of poor oral intake, diuretic, ACE and UTI. I will start IV fluids, will need to monitor for pulmonary edema. Strict I and O. I will order renal US. Will order urine Na and Cr. Although patient has some peripheral edema I think she is intravascular depleted. Will hold lasix and ACE.   2-UTI: UA with positive nitrates, WBC 21 to 50. Patient with problem with urination. I will start IV ceftriaxone. Follow urine culture.   3-Diabetes: Will order SSI, HB-A1c.   4-A fib: Continue with metoprolol, holder parameter for hypotension. Continue with coumadin , pharmacy to dose.   5-Morbid Obesity. 6-RA: Continue with Plaquenil.   7-Chronic Diastolic Heart Failure: Monitor for pulmonary edema. Holding lasix due to acute renal failure.    Code Status: full Family Communication: Care discussed with patient.  Disposition Plan: expect 3 to 4 days inpatient.   Time spent: 75 minutes.   Leodan Bolyard [facility] Pager [phone]  If 7PM-7AM, please contact night-coverage [email or web address] Password [email or web address] 07/07/2012, 1:25 PM

## 2012-07-07 NOTE — Progress Notes (Signed)
Hypoglycemic Event  CBG: 65  Treatment: 15 GM carbohydrate snack  Symptoms: None  Follow-up CBG: Time:0620 CBG Result:118  Possible Reasons for Event: Unknown  Comments/MD notified:Will continue to monitor.     Sherril Croon  Remember to initiate Hypoglycemia Order Set & complete

## 2012-07-07 NOTE — ED Notes (Signed)
Rob leaving ED - contact information: [phone]

## 2012-07-07 NOTE — ED Notes (Signed)
ZOX:WRUE<AV> Expected date:<BR> Expected time:<BR> Means of arrival:<BR> Comments:<BR> Kidney issues

## 2012-07-07 NOTE — ED Provider Notes (Signed)
History    CSN: [id number] Arrival date & time 07/07/12  1015 First MD Initiated Contact with Patient 07/07/12 1018     Chief Complaint  Patient presents with  . Abnormal Lab  . Constipation    HPI Patient presents to the emergency room because of abnormal blood tests. Patient mentions for the last week she has had difficulty with urination as well as constipation. Her last bowel movement was 5 days ago. Her urine output has decreased. She has not had any abdominal pain. She denies any dysuria or fever. She does feel like her abdomen is full. Patient saw her primary doctor yesterday who did lab tests. She was called last evening indicating that her kidney function was getting worse. She was told to go to the emergency department to be admitted to the hospital.. Patient came in this morning as instructed.  Past Medical History  Diagnosis Date  . Myalgia and myositis, unspecified   . Esophageal reflux   . Degeneration of intervertebral disc, site unspecified   . Morbid obesity   . Osteoarthrosis, unspecified whether generalized or localized, unspecified site   . Rheumatoid arthritis(714.0)   . Type II or unspecified type diabetes mellitus without mention of complication, not stated as uncontrolled   . CAD (coronary artery disease)   . Chronic kidney disease   . Thrombocytopenia     Past Surgical History  Procedure Laterality Date  . Appendectomy    . Rotator cuff repair    . Replacement total knee    . Tubal ligation    . Ankle fusion      Family History  Problem Relation Age of Onset  . Rheum arthritis Mother     History  Substance Use Topics  . Smoking status: Former Smoker -- 1.00 packs/day for 17 years    Types: Cigarettes    Quit date: 02/08/1977  . Smokeless tobacco: Not on file  . Alcohol Use: No    OB History   Grav Para Term Preterm Abortions TAB SAB Ect Mult Living                  Review of Systems  All other systems reviewed and are  negative.    Allergies  Amoxicillin-pot clavulanate; Doxycycline hyclate; Iodinated diagnostic agents; Nitrofuran derivatives; Norvasc; Plavix; and Sulfa antibiotics  Home Medications   Current Outpatient Rx  Name  Route  Sig  Dispense  Refill  . aspirin EC 81 MG tablet   Oral   Take 81 mg by mouth at bedtime.          . calcium-vitamin D (OSCAL WITH D) 500-200 MG-UNIT per tablet   Oral   Take 1 tablet by mouth at bedtime.          . Coenzyme Q10 (COQ10) 100 MG CAPS   Oral   Take 1 capsule by mouth 2 (two) times daily.         . furosemide (LASIX) 20 MG tablet   Oral   Take 40 mg by mouth daily.          Marland Kitchen HYDROcodone-acetaminophen (LORTAB) 10-500 MG per tablet   Oral   Take 1 tablet by mouth 2 (two) times daily as needed.         . hydroxychloroquine (PLAQUENIL) 200 MG tablet   Oral   Take 200 mg by mouth 2 (two) times daily.          Marland Kitchen lisinopril (PRINIVIL,ZESTRIL) 10 MG tablet   Oral  Take 5 mg by mouth daily.          . metoprolol (LOPRESSOR) 50 MG tablet   Oral   Take 50 mg by mouth 2 (two) times daily.           . Multiple Vitamins-Minerals (CERTAVITE/ANTIOXIDANTS PO)   Oral   Take 1 tablet by mouth daily.          . NON FORMULARY      Ultimate eye support (6 berries) daily         . Omega-3 Fatty Acids (FISH OIL) 1000 MG CAPS   Oral   Take 1 capsule by mouth 2 (two) times daily.          Marland Kitchen rOPINIRole (REQUIP) 0.5 MG tablet   Oral   Take 0.5 mg by mouth 2 (two) times daily.           . simvastatin (ZOCOR) 20 MG tablet   Oral   Take 20 mg by mouth at bedtime. 1 by mouth daily         . Vanadium 7.5 MG CAPS   Oral   Take 7.5 mg by mouth daily. As directed         . warfarin (COUMADIN) 5 MG tablet      2.5-5 mg daily. Take 1 tab MWF, 1/2 tab all others         . ACCU-CHEK AVIVA PLUS test strip               . Lancets (ACCU-CHEK MULTICLIX) lancets               . ondansetron (ZOFRAN) 4 MG tablet    Oral   Take 4 mg by mouth as needed.            BP 104/83  Pulse 58  Temp(Src) 97.5 F (36.4 C) (Oral)  Resp 17  SpO2 95%  Physical Exam  Nursing note and vitals reviewed. Constitutional: No distress.  Obese  HENT:  Head: Normocephalic and atraumatic.  Right Ear: External ear normal.  Left Ear: External ear normal.  Eyes: Conjunctivae are normal. Right eye exhibits no discharge. Left eye exhibits no discharge. No scleral icterus.  Neck: Neck supple. No tracheal deviation present.  Cardiovascular: Normal rate, regular rhythm and intact distal pulses.   Pulmonary/Chest: Effort normal and breath sounds normal. No stridor. No respiratory distress. She has no wheezes. She has no rales.  Abdominal: Soft. Bowel sounds are normal. She exhibits no distension. There is no tenderness. There is no rebound and no guarding.  Fullness suprapubic region, obese, large pannus  Genitourinary:  Firm brown stool in the rectal vault  Musculoskeletal: She exhibits edema. She exhibits no tenderness.  Neurological: She is alert. She displays atrophy. No sensory deficit. Cranial nerve deficit:  no gross defecits noted. She exhibits abnormal muscle tone. She displays no seizure activity. Coordination normal.  Weakness, bilateral lower extre, (pt uses a wheelchair)  Skin: Skin is warm and dry. No rash noted. She is not diaphoretic.  Psychiatric: She has a normal mood and affect.    ED Course  Procedures (including critical care time)  Labs Reviewed  CBC WITH DIFFERENTIAL - Abnormal; Notable for the following:    Hemoglobin 11.1 (*)    HCT 34.8 (*)    RDW 19.5 (*)    Platelets 101 (*)    Neutrophils Relative % 79 (*)    Lymphocytes Relative 11 (*)    All other components within normal  limits  BASIC METABOLIC PANEL - Abnormal; Notable for the following:    BUN 56 (*)    Creatinine, Ser 2.45 (*)    GFR calc non Af Amer 18 (*)    GFR calc Af Amer 21 (*)    All other components within normal  limits  PRO B NATRIURETIC PEPTIDE - Abnormal; Notable for the following:    Pro B Natriuretic peptide (BNP) 21555.0 (*)    All other components within normal limits  PROTIME-INR - Abnormal; Notable for the following:    Prothrombin Time 28.1 (*)    INR 2.80 (*)    All other components within normal limits  URINALYSIS, ROUTINE W REFLEX MICROSCOPIC   Dg Abd Acute W/chest  07/07/2012   *RADIOLOGY REPORT*  Clinical Data: Crampy abdominal pain.  Cough and chest congestion. Morbidly obese patient with history of coronary artery disease. Former smoker.  ACUTE ABDOMEN SERIES (ABDOMEN 2 VIEW & CHEST 1 VIEW)  Comparison: Two-view chest x-ray 12/20/2011, 10/20/2010.  No prior abdominal imaging.  Findings: Less than optimal examination due to body habitus.  Bowel gas pattern unremarkable without evidence of obstruction or significant ileus.  No evidence of free air or significant air fluid levels on the lateral decubitus image.  Edema in the subcutaneous tissues of the abdominal wall.  No visible opaque urinary tract calculi.  Severe degenerative changes involving the lumbar spine.  Cardiac silhouette enlarged but stable.  Thoracic aorta mildly tortuous and atherosclerotic, unchanged.  Hilar and mediastinal contours otherwise unremarkable.  Low lung volumes due to body habitus with crowded bronchovascular markings diffusely.  Lungs otherwise clear.  Mitral annular calcification.  IMPRESSION:  1.  No acute abdominal abnormality. 2.  Stable cardiomegaly.  Suboptimal inspiration.  No acute cardiopulmonary disease.   Original Report Authenticated By: Hulan Saas, M.D.    1. Acute renal insufficiency   2. Peripheral edema   3. Morbid obesity     MDM  Acute kidney injury The patient has elevated BUN and creatinine. This may be related to her medications and chronic medical conditions. A Foley catheter was placed and there was no evidence of urinary obstruction. Patient does have elevated BNP but x-rays not  suggesting pulmonary edema. This may be more right-sided heart failure.  Constipation The patient symptoms did not suggest any acute infection. She has had issues with chronic constipation the past. Patient will require further treatment but there does not appear to be any evidence of any acute emergency situation associates for constipation the        Celene Kras, MD 07/07/12 1244

## 2012-07-07 NOTE — ED Notes (Addendum)
Pt sent here from PCP.  Pt states her last bowel movement was 5 days ago and that she has not been urinated as much as usual.  Pt states she has been taking both her lasix at the same time during the day.  PCP drew labwork yesterday and she had and decreased kidney function and increased creatinine.  Was told to come here.

## 2012-07-07 NOTE — Progress Notes (Signed)
ANTICOAGULATION CONSULT NOTE - Initial Consult  Pharmacy Consult for Coumadin Indication: atrial fibrillation  Allergies  Allergen Reactions  . Amoxicillin-Pot Clavulanate     Diarrhea ,fatigue mouth sores   . Doxycycline Hyclate   . Iodinated Diagnostic Agents     Full body rash   . Nitrofuran Derivatives   . Norvasc (Amlodipine Besylate)     Leg swelling  . Plavix (Clopidogrel Bisulfate)     REACTION: SOB, and rash  . Sulfa Antibiotics     And adhesive tape    Patient Measurements:    Vital Signs: Temp: 97.5 F (36.4 C) (05/30 1025) Temp src: Oral (05/30 1025) BP: 104/83 mmHg (05/30 1227) Pulse Rate: 58 (05/30 1227)  Labs:  Recent Labs  07/07/12 1038  HGB 11.1*  HCT 34.8*  PLT 101*  LABPROT 28.1*  INR 2.80*  CREATININE 2.45*    The CrCl is unknown because both a height and weight (above a minimum accepted value) are required for this calculation.   Medical History: Past Medical History  Diagnosis Date  . Myalgia and myositis, unspecified   . Esophageal reflux   . Degeneration of intervertebral disc, site unspecified   . Morbid obesity   . Osteoarthrosis, unspecified whether generalized or localized, unspecified site   . Rheumatoid arthritis(714.0)   . Type II or unspecified type diabetes mellitus without mention of complication, not stated as uncontrolled   . CAD (coronary artery disease)   . Chronic kidney disease   . Thrombocytopenia     Assessment:  80 yof on Coumadin PTA for h/o afib presented 5/29 with acute kidney injury and constipation.  MD would like to resume Coumadin per pharmacy.  PTA dose: 5mg  MWF, 2.5 mg other days. Last dose of Coumadin 5/29. INR on admit 5/30 = 2.8   H/H okay, plts 101K (monitor closely)  Goal of Therapy:  INR 2-3 Monitor platelets by anticoagulation protocol: Yes   Plan:   Coumadin 5mg  po x 1 tonight as per PTA  Daily PT/INR  Pharmacy will f/u  Geoffry Paradise, PharmD, BCPS Pager: [phone] 1:47  PM Pharmacy #: [id number]

## 2012-07-07 NOTE — ED Notes (Signed)
Foley catheter placed. Patient tolerated procedure well

## 2012-07-08 DIAGNOSIS — K5900 Constipation, unspecified: Secondary | ICD-10-CM

## 2012-07-08 LAB — BASIC METABOLIC PANEL
BUN: 53 mg/dL — ABNORMAL HIGH (ref 6–23)
CO2: 26 mEq/L (ref 19–32)
Chloride: 103 mEq/L (ref 96–112)
Creatinine, Ser: 2.22 mg/dL — ABNORMAL HIGH (ref 0.50–1.10)
GFR calc Af Amer: 23 mL/min — ABNORMAL LOW (ref >90)
Glucose, Bld: 83 mg/dL (ref 70–99)
Potassium: 4.7 mEq/L (ref 3.5–5.1)

## 2012-07-08 LAB — GLUCOSE, CAPILLARY
Glucose-Capillary: 135 mg/dL — ABNORMAL HIGH (ref 70–99)
Glucose-Capillary: 132 mg/dL — ABNORMAL HIGH (ref 70–99)

## 2012-07-08 LAB — CBC
HCT: 32.6 % — ABNORMAL LOW (ref 36.0–46.0)
Hemoglobin: 9.8 g/dL — ABNORMAL LOW (ref 12.0–15.0)
MCV: 86 fL (ref 78.0–100.0)
RBC: 3.79 MIL/uL — ABNORMAL LOW (ref 3.87–5.11)
RDW: 19.4 % — ABNORMAL HIGH (ref 11.5–15.5)
WBC: 6.2 10*3/uL (ref 4.0–10.5)

## 2012-07-08 MED ORDER — LACTULOSE 10 GM/15ML PO SOLN
20.0000 g | Freq: Two times a day (BID) | ORAL | Status: DC | PRN
  Filled 2012-07-08: qty 30

## 2012-07-08 MED ORDER — NYSTATIN 100000 UNIT/ML MT SUSP
5.0000 mL | Freq: Four times a day (QID) | OROMUCOSAL | Status: DC
  Administered 2012-07-08 – 2012-07-09 (×7): 500000 [IU] via ORAL
  Filled 2012-07-08 (×12): qty 5

## 2012-07-08 MED ORDER — SORBITOL 70 % SOLN
960.0000 mL | TOPICAL_OIL | Freq: Once | ORAL | Status: AC
  Administered 2012-07-08: 960 mL via RECTAL
  Filled 2012-07-08: qty 240

## 2012-07-08 NOTE — Progress Notes (Signed)
[facility] PROGRESS NOTE  Holly [name] VHQ:[id number] DOB: [birth year] DOA: 07/07/2012 PCP: [name], MD  Assessment/Plan: 1-Acute on chronic Renal Failure:  Probably pre renal (ATN) in setting of poor oral intake, diuretic, ACE and UTI.  IV fluids, will need to monitor for pulmonary edema. Strict I and O. Renal U/s ok.  Will hold lasix and ACE, UOP good, d/c foley once patient has BM, baseline CR  About 1.6 2-UTI: UA with positive nitrates, WBC 21 to 50. Patient with problem with urination.  IV ceftriaxone. Follow urine culture.  3-Diabetes: Will order SSI, HB-A1c of 6  4-A fib: Continue with metoprolol with holding parameters for hypotension. Continue with coumadin , pharmacy to dose.  5-Morbid Obesity.  6-RA: Continue with Plaquenil.  7-Chronic Diastolic Heart Failure: Monitor for pulmonary edema. Holding lasix due to acute renal failure  Not ambulatory at baseline- has caregiver in AM and PM but otherwise is in wheelchair- wears a "brief" that is change in AM and PM only  Code Status: full Family Communication: patient at bedside Disposition Plan:    Consultants:    Procedures:    Antibiotics:  rocephin  HPI/Subjective: Feeling better C/o tongue burning no BM x 1 week  Objective: Filed Vitals:   07/07/12 1227 07/07/12 1300 07/07/12 2220 07/08/12 0547  BP: 104/83 97/65 116/51 100/67  Pulse: 58 103 78 64  Temp:  97.5 F (36.4 C) 97.5 F (36.4 C) 97.4 F (36.3 C)  TempSrc:  Oral Oral Oral  Resp: 17 20 20 16   Height:  5' (1.524 m)    Weight:  109.77 kg (242 lb)  113.263 kg (249 lb 11.2 oz)  SpO2: 95% 94% 98% 97%    Intake/Output Summary (Last 24 hours) at 07/08/12 0927 Last data filed at 07/08/12 0814  Gross per 24 hour  Intake   1700 ml  Output    175 ml  Net   1525 ml   Filed Weights   07/07/12 1300 07/08/12 0547  Weight: 109.77 kg (242 lb) 113.263 kg (249 lb 11.2 oz)    Exam:   General:  Mildly confused about how long she  has been here  Cardiovascular: rrr  Respiratory: clear  Abdomen: +BS, obese  Musculoskeletal: weakness b/L all ext   Data Reviewed: Basic Metabolic Panel:  Recent Labs Lab 07/07/12 1038 07/08/12 0522  NA 137 138  K 5.1 4.7  CL 102 103  CO2 24 26  GLUCOSE 90 83  BUN 56* 53*  CREATININE 2.45* 2.22*  CALCIUM 9.1 8.6   Liver Function Tests: No results found for this basename: AST, ALT, ALKPHOS, BILITOT, PROT, ALBUMIN,  in the last 168 hours No results found for this basename: LIPASE, AMYLASE,  in the last 168 hours No results found for this basename: AMMONIA,  in the last 168 hours CBC:  Recent Labs Lab 07/07/12 1038 07/08/12 0522  WBC 7.0 6.2  NEUTROABS 5.5  --   HGB 11.1* 9.8*  HCT 34.8* 32.6*  MCV 85.5 86.0  PLT 101* 100*   Cardiac Enzymes: No results found for this basename: CKTOTAL, CKMB, CKMBINDEX, TROPONINI,  in the last 168 hours BNP (last 3 results)  Recent Labs  07/07/12 1038  PROBNP 21555.0*   CBG:  Recent Labs Lab 07/07/12 1453 07/07/12 1642 07/07/12 1827 07/07/12 2216 07/08/12 0715  GLUCAP 74 65* 118* 99 75    No results found for this or any previous visit (from the past 240 hour(s)).   Studies: US Renal  07/07/2012   *  RADIOLOGY REPORT*  Clinical Data: Renal failure.  RENAL/URINARY TRACT ULTRASOUND COMPLETE  Comparison:  Urinary tract ultrasound 02/06/2009.  Findings:  Right Kidney:  No hydronephrosis.  Well-preserved cortex.  No shadowing calculi.  Normal size and parenchymal echotexture without focal abnormalities.  Approximately 10.6 cm in length.  Left Kidney:  No hydronephrosis.  Well-preserved cortex.  No shadowing calculi.  Normal size and parenchymal echotexture without focal abnormalities, though the lower pole is obscured by shadowing bowel gas from the left colon.  Approximately 9.9 cm in length, best estimate.  Bladder:  Decompressed.  Other findings:  Moderate ascites in the pelvis and small amount of ascites in the right  upper quadrant.  IMPRESSION:  1.  No evidence of hydronephrosis involving either kidney. 2.  No focal renal parenchymal abnormalities with a caveat that the lower pole of the left kidney was obscured by bowel gas and was not evaluated. 3.  Moderate ascites.   Original Report Authenticated By: Hulan Saas, M.D.   Dg Abd Acute W/chest  07/07/2012   *RADIOLOGY REPORT*  Clinical Data: Crampy abdominal pain.  Cough and chest congestion. Morbidly obese patient with history of coronary artery disease. Former smoker.  ACUTE ABDOMEN SERIES (ABDOMEN 2 VIEW & CHEST 1 VIEW)  Comparison: Two-view chest x-ray 12/20/2011, 10/20/2010.  No prior abdominal imaging.  Findings: Less than optimal examination due to body habitus.  Bowel gas pattern unremarkable without evidence of obstruction or significant ileus.  No evidence of free air or significant air fluid levels on the lateral decubitus image.  Edema in the subcutaneous tissues of the abdominal wall.  No visible opaque urinary tract calculi.  Severe degenerative changes involving the lumbar spine.  Cardiac silhouette enlarged but stable.  Thoracic aorta mildly tortuous and atherosclerotic, unchanged.  Hilar and mediastinal contours otherwise unremarkable.  Low lung volumes due to body habitus with crowded bronchovascular markings diffusely.  Lungs otherwise clear.  Mitral annular calcification.  IMPRESSION:  1.  No acute abdominal abnormality. 2.  Stable cardiomegaly.  Suboptimal inspiration.  No acute cardiopulmonary disease.   Original Report Authenticated By: Hulan Saas, M.D.    Scheduled Meds: . aspirin EC  81 mg Oral QHS  . cefTRIAXone (ROCEPHIN)  IV  1 g Intravenous Q24H  . docusate sodium  100 mg Oral BID  . hydroxychloroquine  200 mg Oral BID  . insulin aspart  0-9 Units Subcutaneous TID WC  . metoprolol  50 mg Oral BID  . mineral oil  1 enema Rectal Once  . pneumococcal 23 valent vaccine  0.5 mL Intramuscular Tomorrow-1000  . polyethylene glycol   17 g Oral Daily  . rOPINIRole  0.5 mg Oral BID  . simvastatin  20 mg Oral QHS  . sodium chloride  3 mL Intravenous Q12H  . sorbitol, milk of mag, mineral oil, glycerin (SMOG) enema  960 mL Rectal Once  . Warfarin - Pharmacist Dosing Inpatient   Does not apply q1800   Continuous Infusions: . sodium chloride 75 mL/hr at 07/08/12 0219    Active Problems:   DIABETES MELLITUS, TYPE II   Morbid obesity   CORONARY ARTERY DISEASE   INTERSTITIAL LUNG DISEASE   Acute on chronic renal failure    Time spent: 35    [facility], Kalif Kattner  [facility] Pager [phone]. If 7PM-7AM, please contact night-coverage at [email or web address], password [facility] 07/08/2012, 9:27 AM  LOS: 1 day

## 2012-07-08 NOTE — Progress Notes (Signed)
Patient complaining of soreness and discomfort with foley catheter.  Catheter appears to be working fine but informed patient that if discomfort gets worse to please let staff know.  Will continue to monitor patient.

## 2012-07-08 NOTE — Progress Notes (Signed)
ANTICOAGULATION CONSULT NOTE - Follow Up  Pharmacy Consult for Coumadin Indication: atrial fibrillation  Allergies  Allergen Reactions  . Amoxicillin-Pot Clavulanate     Diarrhea ,fatigue mouth sores   . Doxycycline Hyclate   . Iodinated Diagnostic Agents     Full body rash   . Nitrofuran Derivatives   . Norvasc (Amlodipine Besylate)     Leg swelling  . Plavix (Clopidogrel Bisulfate)     REACTION: SOB, and rash  . Sulfa Antibiotics     And adhesive tape    Patient Measurements: Height: 5' (152.4 cm) Weight: 249 lb 11.2 oz (113.263 kg) IBW/kg (Calculated) : 45.5  Vital Signs: Temp: 97.4 F (36.3 C) (05/31 0547) Temp src: Oral (05/31 0547) BP: 100/67 mmHg (05/31 0547) Pulse Rate: 64 (05/31 0547)  Labs:  Recent Labs  07/07/12 1038 07/08/12 0522  HGB 11.1* 9.8*  HCT 34.8* 32.6*  PLT 101* 100*  LABPROT 28.1* 31.8*  INR 2.80* 3.31*  CREATININE 2.45* 2.22*    Estimated Creatinine Clearance: 23.9 ml/min (by C-G formula based on Cr of 2.22).   Medical History: Past Medical History  Diagnosis Date  . Myalgia and myositis, unspecified   . Esophageal reflux   . Degeneration of intervertebral disc, site unspecified   . Morbid obesity   . Osteoarthrosis, unspecified whether generalized or localized, unspecified site   . Rheumatoid arthritis(714.0)   . Type II or unspecified type diabetes mellitus without mention of complication, not stated as uncontrolled   . CAD (coronary artery disease)   . Chronic kidney disease   . Thrombocytopenia     Assessment:  80 yof on Coumadin PTA for h/o afib presented 5/29 with acute kidney injury and constipation.  MD would like to resume Coumadin per pharmacy.  PTA dose: 5mg  MWF, 2.5 mg other days. Last dose of Coumadin 5/29. INR on admit 5/30 = 2.8   H/H okay, plts 101K (monitor closely)  INR now supratherapeutic - 5mg  x 1 inpatient  Goal of Therapy:  INR 2-3 Monitor platelets by anticoagulation protocol: Yes   Plan:    Hold warfarin today  Daily PT/INR  Pharmacy will f/u  Hessie Knows, PharmD, BCPS Pager [phone] 07/08/2012 9:32 AM

## 2012-07-08 NOTE — Progress Notes (Signed)
Patient's foley has not had much output - only 100 -minimal drainage when tubing manipulated. Bladder scanned patient with a result of greater than 740 ml. Lightly irrigated foley to no effect. Notified Dr. Benjamine Mola who gave the order to discontinue the foley for the next 2-3 hours to see if patient could void on their own. Discontinued foley and placed patient on bedpan. Will notify oncoming nurse to encourage patient void as well. Erskin Burnet RN

## 2012-07-09 ENCOUNTER — Inpatient Hospital Stay (HOSPITAL_COMMUNITY): Payer: Medicare Other

## 2012-07-09 DIAGNOSIS — Z515 Encounter for palliative care: Secondary | ICD-10-CM

## 2012-07-09 HISTORY — DX: Encounter for palliative care: Z51.5

## 2012-07-09 LAB — CBC
Hemoglobin: 9.7 g/dL — ABNORMAL LOW (ref 12.0–15.0)
Platelets: 101 10*3/uL — ABNORMAL LOW (ref 150–400)
RBC: 3.71 MIL/uL — ABNORMAL LOW (ref 3.87–5.11)

## 2012-07-09 LAB — BLOOD GAS, ARTERIAL
Acid-base deficit: 3.4 mmol/L — ABNORMAL HIGH (ref 0.0–2.0)
Drawn by: 11249
O2 Content: 4 L/min
O2 Saturation: 93.2 %
pO2, Arterial: 65.9 mmHg — ABNORMAL LOW (ref 80.0–100.0)

## 2012-07-09 LAB — BASIC METABOLIC PANEL
CO2: 24 mEq/L (ref 19–32)
Calcium: 8.5 mg/dL (ref 8.4–10.5)
GFR calc non Af Amer: 22 mL/min — ABNORMAL LOW (ref >90)
Glucose, Bld: 91 mg/dL (ref 70–99)
Potassium: 4.4 mEq/L (ref 3.5–5.1)
Sodium: 139 mEq/L (ref 135–145)

## 2012-07-09 LAB — URINE CULTURE: Colony Count: >=100000

## 2012-07-09 LAB — PROTIME-INR: INR: 3.1 — ABNORMAL HIGH (ref 0.00–1.49)

## 2012-07-09 LAB — GLUCOSE, CAPILLARY
Glucose-Capillary: 88 mg/dL (ref 70–99)
Glucose-Capillary: 114 mg/dL — ABNORMAL HIGH (ref 70–99)
Glucose-Capillary: 92 mg/dL (ref 70–99)

## 2012-07-09 LAB — VITAMIN B12: Vitamin B-12: 1533 pg/mL — ABNORMAL HIGH (ref 211–911)

## 2012-07-09 MED ORDER — ONDANSETRON HCL 4 MG/2ML IJ SOLN
4.0000 mg | Freq: Four times a day (QID) | INTRAMUSCULAR | Status: DC | PRN
  Administered 2012-07-09 – 2012-07-10 (×3): 4 mg via INTRAVENOUS
  Filled 2012-07-09 (×3): qty 2

## 2012-07-09 MED ORDER — MORPHINE SULFATE 2 MG/ML IJ SOLN: 2.0000 mg | Freq: Once | INTRAMUSCULAR | Status: AC

## 2012-07-09 MED ORDER — LORAZEPAM 2 MG/ML IJ SOLN
INTRAMUSCULAR | Status: AC
  Administered 2012-07-09: 0.5 mg via INTRAVENOUS
  Filled 2012-07-09: qty 1

## 2012-07-09 MED ORDER — LORAZEPAM 2 MG/ML IJ SOLN: 0.5000 mg | Freq: Once | INTRAMUSCULAR | Status: AC

## 2012-07-09 MED ORDER — PROMETHAZINE HCL 25 MG/ML IJ SOLN
12.5000 mg | Freq: Once | INTRAMUSCULAR | Status: AC
  Administered 2012-07-09: 12.5 mg via INTRAVENOUS
  Filled 2012-07-09: qty 1

## 2012-07-09 MED ORDER — MORPHINE SULFATE 2 MG/ML IJ SOLN
INTRAMUSCULAR | Status: AC
  Administered 2012-07-09: 2 mg via INTRAVENOUS
  Filled 2012-07-09: qty 1

## 2012-07-09 NOTE — Progress Notes (Signed)
[facility] PROGRESS NOTE  Holly [name] ZOX:[id number] DOB: 1931-08-18 DOA: 07/07/2012 PCP: [name], MD  Assessment/Plan: 1-Acute on chronic Renal Failure:  Probably pre renal (ATN) in setting of poor oral intake, diuretic, ACE and UTI.  IV fluids, will need to monitor for pulmonary edema. Strict I and O. Renal U/s ok.  Will hold lasix and ACE, UOP good, d/c foley once patient has BM, baseline CR  About 1.6 2-UTI: UA with positive nitrates, WBC 21 to 50. Patient with problem with urination.  IV ceftriaxone. Follow urine culture- ecoli.  3-Diabetes: Will order SSI, HB-A1c of 6  4-A fib: Continue with metoprolol with holding parameters for hypotension. Continue with coumadin , pharmacy to dose.  5-Morbid Obesity.  6-RA: Continue with Plaquenil.  7-Chronic Diastolic Heart Failure: Monitor for pulmonary edema. Holding lasix due to acute renal failure 8- thrush- nystatin swish and swallow  Not ambulatory at baseline- has caregiver in AM and PM but otherwise is in wheelchair- wears a "brief" that is change in AM and PM only- ? SNF vs patient has called for more help during the day when she is home- patient has capacity  Code Status: full Family Communication: patient at bedside Disposition Plan:    Consultants:    Procedures:    Antibiotics:  rocephin  HPI/Subjective: Mouth feeling better  Objective: Filed Vitals:   07/08/12 0547 07/08/12 1003 07/08/12 1500 07/09/12 0519  BP: 100/67 94/42 92/45  106/42  Pulse: 64 59 62 73  Temp: 97.4 F (36.3 C)  97.4 F (36.3 C) 97.4 F (36.3 C)  TempSrc: Oral  Oral Oral  Resp: 16  18 16   Height:      Weight: 113.263 kg (249 lb 11.2 oz)   113.3 kg (249 lb 12.5 oz)  SpO2: 97%  97% 98%    Intake/Output Summary (Last 24 hours) at 07/09/12 1009 Last data filed at 07/09/12 0942  Gross per 24 hour  Intake 2556.25 ml  Output    101 ml  Net 2455.25 ml   Filed Weights   07/07/12 1300 07/08/12 0547 07/09/12 0519   Weight: 109.77 kg (242 lb) 113.263 kg (249 lb 11.2 oz) 113.3 kg (249 lb 12.5 oz)    Exam:   General:  Mildly confused about how long she has been here  Cardiovascular: rrr  Respiratory: clear  Abdomen: +BS, obese  Musculoskeletal: weakness b/L all ext   Data Reviewed: Basic Metabolic Panel:  Recent Labs Lab 07/07/12 1038 07/08/12 0522 07/09/12 0510  NA 137 138 139  K 5.1 4.7 4.4  CL 102 103 106  CO2 24 26 24   GLUCOSE 90 83 91  BUN 56* 53* 49*  CREATININE 2.45* 2.22* 2.06*  CALCIUM 9.1 8.6 8.5   Liver Function Tests: No results found for this basename: AST, ALT, ALKPHOS, BILITOT, PROT, ALBUMIN,  in the last 168 hours No results found for this basename: LIPASE, AMYLASE,  in the last 168 hours No results found for this basename: AMMONIA,  in the last 168 hours CBC:  Recent Labs Lab 07/07/12 1038 07/08/12 0522 07/09/12 0510  WBC 7.0 6.2 6.9  NEUTROABS 5.5  --   --   HGB 11.1* 9.8* 9.7*  HCT 34.8* 32.6* 31.9*  MCV 85.5 86.0 86.0  PLT 101* 100* 101*   Cardiac Enzymes: No results found for this basename: CKTOTAL, CKMB, CKMBINDEX, TROPONINI,  in the last 168 hours BNP (last 3 results)  Recent Labs  07/07/12 1038  PROBNP 21555.0*   CBG:  Recent  Labs Lab 07/08/12 0715 07/08/12 1157 07/08/12 1650 07/08/12 2148 07/09/12 0741  GLUCAP 75 108* 135* 132* 88    Recent Results (from the past 240 hour(s))  URINE CULTURE     Status: None   Collection Time    07/07/12 12:27 PM      Result Value Range Status   Specimen Description URINE, RANDOM   Final   Special Requests NONE   Final   Culture  Setup Time 07/07/2012 23:01   Final   Colony Count >=100,000 COLONIES/ML   Final   Culture ESCHERICHIA COLI   Final   Report Status PENDING   Incomplete     Studies: US Renal  07/07/2012   *RADIOLOGY REPORT*  Clinical Data: Renal failure.  RENAL/URINARY TRACT ULTRASOUND COMPLETE  Comparison:  Urinary tract ultrasound 02/06/2009.  Findings:  Right Kidney:  No  hydronephrosis.  Well-preserved cortex.  No shadowing calculi.  Normal size and parenchymal echotexture without focal abnormalities.  Approximately 10.6 cm in length.  Left Kidney:  No hydronephrosis.  Well-preserved cortex.  No shadowing calculi.  Normal size and parenchymal echotexture without focal abnormalities, though the lower pole is obscured by shadowing bowel gas from the left colon.  Approximately 9.9 cm in length, best estimate.  Bladder:  Decompressed.  Other findings:  Moderate ascites in the pelvis and small amount of ascites in the right upper quadrant.  IMPRESSION:  1.  No evidence of hydronephrosis involving either kidney. 2.  No focal renal parenchymal abnormalities with a caveat that the lower pole of the left kidney was obscured by bowel gas and was not evaluated. 3.  Moderate ascites.   Original Report Authenticated By: Hulan Saas, M.D.   Dg Abd Acute W/chest  07/07/2012   *RADIOLOGY REPORT*  Clinical Data: Crampy abdominal pain.  Cough and chest congestion. Morbidly obese patient with history of coronary artery disease. Former smoker.  ACUTE ABDOMEN SERIES (ABDOMEN 2 VIEW & CHEST 1 VIEW)  Comparison: Two-view chest x-ray 12/20/2011, 10/20/2010.  No prior abdominal imaging.  Findings: Less than optimal examination due to body habitus.  Bowel gas pattern unremarkable without evidence of obstruction or significant ileus.  No evidence of free air or significant air fluid levels on the lateral decubitus image.  Edema in the subcutaneous tissues of the abdominal wall.  No visible opaque urinary tract calculi.  Severe degenerative changes involving the lumbar spine.  Cardiac silhouette enlarged but stable.  Thoracic aorta mildly tortuous and atherosclerotic, unchanged.  Hilar and mediastinal contours otherwise unremarkable.  Low lung volumes due to body habitus with crowded bronchovascular markings diffusely.  Lungs otherwise clear.  Mitral annular calcification.  IMPRESSION:  1.  No acute  abdominal abnormality. 2.  Stable cardiomegaly.  Suboptimal inspiration.  No acute cardiopulmonary disease.   Original Report Authenticated By: Hulan Saas, M.D.    Scheduled Meds: . aspirin EC  81 mg Oral QHS  . cefTRIAXone (ROCEPHIN)  IV  1 g Intravenous Q24H  . docusate sodium  100 mg Oral BID  . hydroxychloroquine  200 mg Oral BID  . insulin aspart  0-9 Units Subcutaneous TID WC  . mineral oil  1 enema Rectal Once  . nystatin  5 mL Oral QID  . polyethylene glycol  17 g Oral Daily  . rOPINIRole  0.5 mg Oral BID  . simvastatin  20 mg Oral QHS  . sodium chloride  3 mL Intravenous Q12H  . Warfarin - Pharmacist Dosing Inpatient   Does not apply [phone]  Continuous Infusions: . sodium chloride 75 mL/hr at 07/09/12 0516    Active Problems:   DIABETES MELLITUS, TYPE II   Morbid obesity   CORONARY ARTERY DISEASE   INTERSTITIAL LUNG DISEASE   Acute on chronic renal failure   Unspecified constipation    Time spent: 35    [facility], Regene Mccarthy  [facility] Pager [phone]. If 7PM-7AM, please contact night-coverage at [email or web address], password [facility] 07/09/2012, 10:09 AM  LOS: 2 days

## 2012-07-09 NOTE — Progress Notes (Signed)
ANTICOAGULATION CONSULT NOTE - Follow Up  Pharmacy Consult for Coumadin Indication: atrial fibrillation  Allergies  Allergen Reactions  . Amoxicillin-Pot Clavulanate     Diarrhea ,fatigue mouth sores   . Doxycycline Hyclate   . Iodinated Diagnostic Agents     Full body rash   . Nitrofuran Derivatives   . Norvasc (Amlodipine Besylate)     Leg swelling  . Plavix (Clopidogrel Bisulfate)     REACTION: SOB, and rash  . Sulfa Antibiotics     And adhesive tape    Patient Measurements: Height: 5' (152.4 cm) Weight: 249 lb 12.5 oz (113.3 kg) IBW/kg (Calculated) : 45.5  Vital Signs: Temp: 97.4 F (36.3 C) (06/01 0519) Temp src: Oral (06/01 0519) BP: 106/42 mmHg (06/01 0519) Pulse Rate: 73 (06/01 0519)  Labs:  Recent Labs  07/07/12 1038 07/08/12 0522 07/09/12 0510  HGB 11.1* 9.8* 9.7*  HCT 34.8* 32.6* 31.9*  PLT 101* 100* 101*  LABPROT 28.1* 31.8* 30.3*  INR 2.80* 3.31* 3.10*  CREATININE 2.45* 2.22* 2.06*    Estimated Creatinine Clearance: 25.8 ml/min (by C-G formula based on Cr of 2.06).   Medical History: Past Medical History  Diagnosis Date  . Myalgia and myositis, unspecified   . Esophageal reflux   . Degeneration of intervertebral disc, site unspecified   . Morbid obesity   . Osteoarthrosis, unspecified whether generalized or localized, unspecified site   . Rheumatoid arthritis(714.0)   . Type II or unspecified type diabetes mellitus without mention of complication, not stated as uncontrolled   . CAD (coronary artery disease)   . Chronic kidney disease   . Thrombocytopenia     Assessment:  Holly [name] on Coumadin PTA for h/o afib presented 5/29 with acute kidney injury and constipation.  MD would like to resume Coumadin per pharmacy.  PTA dose: 5mg  MWF, 2.5 mg other days. Last dose of Coumadin 5/29. INR on admit 5/30 = 2.8   H/H okay, plts 101K (monitor closely)  INR down but still supratherapeutic - 5mg , 0mg  as inpatient  Goal of Therapy:  INR  2-3 Monitor platelets by anticoagulation protocol: Yes   Plan:   Hold warfarin again today  Daily PT/INR  Hessie Knows, PharmD, BCPS Pager [phone] 07/09/2012 9:43 AM

## 2012-07-09 NOTE — Progress Notes (Signed)
Clinical Social Work Department BRIEF PSYCHOSOCIAL ASSESSMENT 07/09/2012  Patient:  Holly [name], Holly [name]     Account Number:  [id number]     Admit date:  07/07/2012  Clinical Social Worker:  Doroteo Glassman  Date/Time:  07/09/2012 03:58 PM  Referred by:  Physician  Date Referred:  07/09/2012 Referred for  SNF Placement   Other Referral:   Interview type:  Patient Other interview type:    PSYCHOSOCIAL DATA Living Status:  ALONE Admitted from facility:   Level of care:   Primary support name:  Gwenyth Bouillon Primary support relationship to patient:  CHILD, ADULT Degree of support available:   adequate    CURRENT CONCERNS Current Concerns  Post-Acute Placement   Other Concerns:    SOCIAL WORK ASSESSMENT / PLAN Met with Pt to discuss d/c plans.    Pt stated that she has made arrangements with [facility] to move in there once they've put down hardwook flooring for her in her room, as she can't get around a room in her wheelchair that has carpet.  [place] is in the process of redoing the floors to accommodate Pt, thus she will d/c home and await her room at [facility].    Pt stated that she has intends to bump her hired help up from 4 hours a day to 6 hours a day.  She stated, too, that she has many friends from church who are willing and able to assist her, should the need arise.    CSW thanked Pt for her time.    No further CSW needs identified.    CSW to sign off.   Assessment/plan status:  No Further Intervention Required Other assessment/ plan:   Information/referral to community resources:   n/a    PATIENT'S/FAMILY'S RESPONSE TO PLAN OF CARE: Pt thanked CSW for time and assistance.   Providence Crosby, LCSWA Clinical Social Work [phone]

## 2012-07-09 NOTE — Progress Notes (Signed)
[facility] progress note. Chief complaint. Dyspnea, anxiety. History of present illness. This 79 year old female in hospital with acute on chronic renal failure and urinary tract infection. She was complaining of nausea and vomiting earlier this evening and I gave her a dose of Zofran. Unfortunately the Zofran was ineffective and her vomiting persisted. And gave her a dose of Phenergan IV and nursing observes the patient became agitated/anxious, restless, short of breath, and tachycardic. I was called to the bedside along with rapid response. No neurologic changes observed suggestive of a stroke. Patient is observed frequently moving her lower extremities, restless/anxious and unable to get comfortable in her bed. I asked her she never received Phenergan before and she stated not. Given the tachypnea /dyspnea an ABG was obtained and this resulted pH 7.461, PCO2 26.8, PO2 65.9, bicarbonate 18.8. This ABG look suggestive of hyperventilation. Vital signs temperature 97.4, pulse 125, respiration 18, blood pressure 141/94. O2 sats 96%. General appearance. Morbidly obese so it female who is alert and cooperative though quite anxious. Cardiac. Regular and tachycardic. No jugular venous distention or significant edema. Lungs. Somewhat diminished in all fields but clear. No crackles appreciated. Abdomen. Soft and obese with positive bowel sounds. No pain. Extremities. Patient repeatedly moving both lower extremities and indicating that they feel tight. She states she does have an extensive history of restless leg syndrome. Impression/plan. Problem #1. Dyspnea, tachypnea, tachycardia. Believes all of this is related to anxiety and that Phenergan is probably causative factor. I have listed Phenergan as a drug intolerance. Will provide the patient 1 dose of Ativan 0.5 mg IV for her anxiety. Patient stable per ABG. Nursing will continue to follow patient closely overnight and update me of any further changes.  Given patient's dyspnea will obtain a portable chest x-ray though clinically the patient breath sounds are clear.

## 2012-07-09 NOTE — Progress Notes (Signed)
Pt is able to void incontinently, so unable to measure. Bladder scanned for 374. Pt denies the urge to void. Will cont to monitor.

## 2012-07-10 ENCOUNTER — Inpatient Hospital Stay (HOSPITAL_COMMUNITY): Payer: Medicare Other

## 2012-07-10 ENCOUNTER — Ambulatory Visit (HOSPITAL_COMMUNITY): Payer: Medicare Other

## 2012-07-10 DIAGNOSIS — N289 Disorder of kidney and ureter, unspecified: Secondary | ICD-10-CM

## 2012-07-10 DIAGNOSIS — K5900 Constipation, unspecified: Secondary | ICD-10-CM

## 2012-07-10 DIAGNOSIS — R4182 Altered mental status, unspecified: Secondary | ICD-10-CM

## 2012-07-10 DIAGNOSIS — K219 Gastro-esophageal reflux disease without esophagitis: Secondary | ICD-10-CM

## 2012-07-10 LAB — HIV ANTIBODY (ROUTINE TESTING W REFLEX): HIV: NONREACTIVE

## 2012-07-10 LAB — CBC
HCT: 33.8 % — ABNORMAL LOW (ref 36.0–46.0)
MCHC: 30.8 g/dL (ref 30.0–36.0)
MCV: 86 fL (ref 78.0–100.0)
Platelets: 110 10*3/uL — ABNORMAL LOW (ref 150–400)
RDW: 19.3 % — ABNORMAL HIGH (ref 11.5–15.5)
WBC: 9 10*3/uL (ref 4.0–10.5)

## 2012-07-10 LAB — BASIC METABOLIC PANEL
BUN: 45 mg/dL — ABNORMAL HIGH (ref 6–23)
Calcium: 8.9 mg/dL (ref 8.4–10.5)
Chloride: 105 mEq/L (ref 96–112)
Chloride: 108 mEq/L (ref 96–112)
Creatinine, Ser: 1.84 mg/dL — ABNORMAL HIGH (ref 0.50–1.10)
GFR calc Af Amer: 29 mL/min — ABNORMAL LOW (ref >90)
GFR calc Af Amer: 34 mL/min — ABNORMAL LOW (ref >90)
GFR calc non Af Amer: 25 mL/min — ABNORMAL LOW (ref >90)
GFR calc non Af Amer: 30 mL/min — ABNORMAL LOW (ref >90)
Potassium: 4.1 mEq/L (ref 3.5–5.1)

## 2012-07-10 LAB — GLUCOSE, CAPILLARY: Glucose-Capillary: 97 mg/dL (ref 70–99)

## 2012-07-10 LAB — BLOOD GAS, ARTERIAL
Acid-base deficit: 3.9 mmol/L — ABNORMAL HIGH (ref 0.0–2.0)
Drawn by: 11249
TCO2: 19.4 mmol/L (ref 0–100)
pCO2 arterial: 39.2 mmHg (ref 35.0–45.0)
pH, Arterial: 7.346 — ABNORMAL LOW (ref 7.350–7.450)
pO2, Arterial: 51.7 mmHg — ABNORMAL LOW (ref 80.0–100.0)

## 2012-07-10 LAB — TROPONIN I: Troponin I: <0.3 ng/mL (ref <0.30)

## 2012-07-10 LAB — HEMOGLOBIN AND HEMATOCRIT, BLOOD: Hemoglobin: 10.8 g/dL — ABNORMAL LOW (ref 12.0–15.0)

## 2012-07-10 LAB — PROTIME-INR: INR: 2.56 — ABNORMAL HIGH (ref 0.00–1.49)

## 2012-07-10 LAB — OCCULT BLOOD GASTRIC / DUODENUM (SPECIMEN CUP): pH, Gastric: 1

## 2012-07-10 LAB — MRSA PCR SCREENING: MRSA by PCR: NEGATIVE

## 2012-07-10 LAB — LACTIC ACID, PLASMA: Lactic Acid, Venous: 1 mmol/L (ref 0.5–2.2)

## 2012-07-10 MED ORDER — FLUCONAZOLE 100MG IVPB
100.0000 mg | Freq: Every day | INTRAVENOUS | Status: DC
  Administered 2012-07-10 – 2012-07-12 (×3): 100 mg via INTRAVENOUS
  Filled 2012-07-10 (×5): qty 50

## 2012-07-10 MED ORDER — VANCOMYCIN HCL 10 G IV SOLR
1750.0000 mg | Freq: Once | INTRAVENOUS | Status: AC
  Administered 2012-07-10: 1750 mg via INTRAVENOUS
  Filled 2012-07-10: qty 1750

## 2012-07-10 MED ORDER — FUROSEMIDE 10 MG/ML IJ SOLN: 20.0000 mg | Freq: Once | INTRAMUSCULAR | Status: DC

## 2012-07-10 MED ORDER — PANTOPRAZOLE SODIUM 40 MG IV SOLR
40.0000 mg | Freq: Two times a day (BID) | INTRAVENOUS | Status: DC
  Administered 2012-07-10 – 2012-07-12 (×5): 40 mg via INTRAVENOUS
  Filled 2012-07-10 (×6): qty 40

## 2012-07-10 MED ORDER — INSULIN ASPART 100 UNIT/ML ~~LOC~~ SOLN: 0.0000 [IU] | SUBCUTANEOUS | Status: DC

## 2012-07-10 MED ORDER — DEXTROSE-NACL 5-0.45 % IV SOLN
INTRAVENOUS | Status: DC
  Administered 2012-07-10 – 2012-07-11 (×2): 50 mL/h via INTRAVENOUS

## 2012-07-10 MED ORDER — SODIUM CHLORIDE 0.9 % IV SOLN
INTRAVENOUS | Status: DC
Start: 2012-07-10 — End: 2012-07-17

## 2012-07-10 MED ORDER — LEVALBUTEROL HCL 0.63 MG/3ML IN NEBU: 0.6300 mg | INHALATION_SOLUTION | RESPIRATORY_TRACT | Status: DC | PRN

## 2012-07-10 MED ORDER — SORBITOL 70 % SOLN
960.0000 mL | TOPICAL_OIL | Freq: Once | ORAL | Status: AC
  Administered 2012-07-10: 960 mL via RECTAL
  Filled 2012-07-10: qty 240

## 2012-07-10 MED ORDER — LEVALBUTEROL HCL 0.63 MG/3ML IN NEBU
0.6300 mg | INHALATION_SOLUTION | Freq: Three times a day (TID) | RESPIRATORY_TRACT | Status: DC
  Administered 2012-07-10 – 2012-07-17 (×21): 0.63 mg via RESPIRATORY_TRACT
  Filled 2012-07-10 (×30): qty 3

## 2012-07-10 MED ORDER — VANCOMYCIN HCL 10 G IV SOLR: 1500.0000 mg | INTRAVENOUS | Status: DC

## 2012-07-10 MED ORDER — DEXTROSE 5 % IV SOLN
2.0000 g | Freq: Three times a day (TID) | INTRAVENOUS | Status: DC
  Administered 2012-07-10 – 2012-07-11 (×5): 2 g via INTRAVENOUS
  Filled 2012-07-10 (×7): qty 2

## 2012-07-10 MED ORDER — SODIUM CHLORIDE 0.9 % IV BOLUS (SEPSIS)
500.0000 mL | Freq: Once | INTRAVENOUS | Status: AC
  Administered 2012-07-10: 500 mL via INTRAVENOUS

## 2012-07-10 NOTE — Progress Notes (Signed)
Pt placed on Auto BIPAP 20/8 with 6 lpm O2 bleed in via FFM, Pt tolerating well at this time, RT to monitor and assess as needed.

## 2012-07-10 NOTE — Progress Notes (Signed)
Patient started feeling sick and starting vomiting around 8pm, MD notified and placed order for Zofran.  An hour after Zofran was given the patient continued vomiting and MD was notified again.  Phenergan order was placed and given to patient.  Soon after patient's breathing became labored and started desatting, HR went up in the 130's, and she was very restless and anxious.  MD notified of patient status change and rapid response was called.  See hospitalist progress note for further details.  Will continue to monitor.  Ernesta Amble, RN

## 2012-07-10 NOTE — Progress Notes (Signed)
RN calling   C/o poor ur op 300cc since 7am  Patoent has anasarca with ascites and cirrhosis on CT abd today BNP is 20,000 but no echo Creat improving  Recent Labs Lab 07/07/12 1038 07/08/12 0522 07/09/12 0510 07/10/12 0430  CREATININE 2.45* 2.22* 2.06* 1.84*     However, patient having copious NG returns 1000 cc since 7am  Plan 500cc saline bolus Check echo Recheck bmet stat   Dr. Kalman Shan, M.D., [facility].C.P Pulmonary and Critical Care Medicine Staff Physician [facility] Pager: [phone], If no answer or between  15:00h - 7:00h: call [phone]  07/10/2012 4:24 PM

## 2012-07-10 NOTE — Consult Note (Signed)
PULMONARY  / CRITICAL CARE MEDICINE  Name: Holly [name] MRN: [id number] DOB: Jun 17, 1931    ADMISSION DATE:  07/07/2012 CONSULTATION DATE:  Holly [name]  REFERRING MD :  [facility] PRIMARY SERVICE: [facility]  CHIEF COMPLAINT:  AMS  BRIEF PATIENT DESCRIPTION:  79 yo MO(258 lbs) WF with known OSA and is Cpap dependent. S presented to [facility] 5-30 with ongoing abdominal pain and worsening renal failure. She is on coumadin for Afib and did develop brownish emesis 6-2 and was treated with phenergan for nausea/vomiting. She was moved to the ICU 6-2 for AMS following phenergan and was found to be hypoxic by ABG consistent with hypoventilation. She has improved with high flow O2 via a NRB and is in no acute distress. PCCM has been asked to evaluate. We have stopped mso4 and ativan.   SIGNIFICANT EVENTS:  6-2 moved to ICU after AMS, post IV antivan and MSO4  STUDIES:   CULTURES: 5-30 uc >> ecoli ss roc 6-2 bc x 2>> 6-2 sputum>>  ANTIBIOTICS: 6-2 Azactam(per PCP)>> 6-2 Vanc(per PCP)>>  6-2 diflucan>>  HISTORY OF PRESENT ILLNESS:   79 yo MO(258 lbs) WF with known OSA and is Cpap dependent. S presented to [facility] 5-30 with ongoing abdominal pain and worsening renal failure. She is on coumadin for Afib and did develop brownish emesis 6-2 and was treated with phenergan for nausea/vomiting. She was moved to the ICU 6-2 for AMS following phenergan and was found to be hypoxic by ABG consistent with hypoventilation. She has improved with high flow O2 via a NRB and is in no acute distress. PCCM has been asked to evaluate. We have stopped mso4 and ativan.   PAST MEDICAL HISTORY :  Past Medical History  Diagnosis Date  . Myalgia and myositis, unspecified   . Esophageal reflux   . Degeneration of intervertebral disc, site unspecified   . Morbid obesity   . Osteoarthrosis, unspecified whether generalized or localized, unspecified site   . Rheumatoid arthritis(714.0)   . Type II or unspecified type diabetes mellitus  without mention of complication, not stated as uncontrolled   . CAD (coronary artery disease)   . Chronic kidney disease   . Thrombocytopenia    Past Surgical History  Procedure Laterality Date  . Appendectomy    . Rotator cuff repair    . Replacement total knee    . Tubal ligation    . Ankle fusion     Prior to Admission medications   Medication Sig Start Date End Date Taking? Authorizing Provider  aspirin EC 81 MG tablet Take 81 mg by mouth at bedtime.    Yes Historical Provider, MD  calcium-vitamin D (OSCAL WITH D) 500-200 MG-UNIT per tablet Take 1 tablet by mouth at bedtime.    Yes Historical Provider, MD  Coenzyme Q10 (COQ10) 100 MG CAPS Take 1 capsule by mouth 2 (two) times daily.   Yes Historical Provider, MD  furosemide (LASIX) 20 MG tablet Take 40 mg by mouth daily.    Yes Historical Provider, MD  HYDROcodone-acetaminophen (LORTAB) 10-500 MG per tablet Take 1 tablet by mouth 2 (two) times daily as needed.   Yes Historical Provider, MD  hydroxychloroquine (PLAQUENIL) 200 MG tablet Take 200 mg by mouth 2 (two) times daily.    Yes Historical Provider, MD  lisinopril (PRINIVIL,ZESTRIL) 10 MG tablet Take 5 mg by mouth daily.    Yes Historical Provider, MD  metoprolol (LOPRESSOR) 50 MG tablet Take 50 mg by mouth 2 (two) times daily.  Yes Historical Provider, MD  Multiple Vitamins-Minerals (CERTAVITE/ANTIOXIDANTS PO) Take 1 tablet by mouth daily.    Yes Historical Provider, MD  NON FORMULARY Ultimate eye support (6 berries) daily   Yes Historical Provider, MD  Omega-3 Fatty Acids (FISH OIL) 1000 MG CAPS Take 1 capsule by mouth 2 (two) times daily.    Yes Historical Provider, MD  rOPINIRole (REQUIP) 0.5 MG tablet Take 0.5 mg by mouth 2 (two) times daily.     Yes Historical Provider, MD  simvastatin (ZOCOR) 20 MG tablet Take 20 mg by mouth at bedtime. 1 by mouth daily 06/16/11  Yes Historical Provider, MD  Vanadium 7.5 MG CAPS Take 7.5 mg by mouth daily. As directed   Yes Historical  Provider, MD  warfarin (COUMADIN) 5 MG tablet 2.5-5 mg daily. Take 1 tab MWF, 1/2 tab all others 05/08/11  Yes Historical Provider, MD  ACCU-CHEK AVIVA PLUS test strip  12/04/11   Historical Provider, MD  Lancets (ACCU-CHEK MULTICLIX) lancets  12/04/11   Historical Provider, MD  ondansetron (ZOFRAN) 4 MG tablet Take 4 mg by mouth as needed.  11/22/11   Historical Provider, MD   Allergies  Allergen Reactions  . Amoxicillin-Pot Clavulanate     Diarrhea ,fatigue mouth sores   . Doxycycline Hyclate   . Iodinated Diagnostic Agents     Full body rash   . Nitrofuran Derivatives   . Norvasc (Amlodipine Besylate)     Leg swelling  . Plavix (Clopidogrel Bisulfate)     REACTION: SOB, and rash  . Sulfa Antibiotics     And adhesive tape  . Phenergan (Promethazine Hcl) Anxiety    FAMILY HISTORY:  Family History  Problem Relation Age of Onset  . Rheum arthritis Mother    SOCIAL HISTORY:  reports that she quit smoking about 35 years ago. Her smoking use included Cigarettes. She has a 17 pack-year smoking history. She has never used smokeless tobacco. She reports that she does not drink alcohol or use illicit drugs.  REVIEW OF SYSTEMS:   Negative except above.  SUBJECTIVE:  C/o abdominal discomfort.  Breathing okay.  Denies chest pain.  VITAL SIGNS: Temp:  [97.4 F (36.3 C)-100 F (37.8 C)] 97.8 F (36.6 C) (06/02 0800) Pulse Rate:  [77-125] 86 (06/02 0900) Resp:  [18-22] 21 (06/02 0900) BP: (117-153)/(62-100) 141/81 mmHg (06/02 0900) SpO2:  [82 %-100 %] 100 % (06/02 0900) FiO2 (%):  [50 %] 50 % (06/02 1610) Weight:  [117.3 kg (258 lb 9.6 oz)] 117.3 kg (258 lb 9.6 oz) (06/02 0500) 35% VM  INTAKE / OUTPUT: Intake/Output     06/01 0701 - 06/02 0700 06/02 0701 - 06/03 0700   P.O. 840    I.V. (mL/kg) 1297.5 (11.1)    IV Piggyback 100 500   Total Intake(mL/kg) 2237.5 (19.1) 500 (4.3)   Urine (mL/kg/hr)     Emesis/NG output 300 (0.1)    Stool     Total Output 300     Net +1937.5  +500        Urine Occurrence 3 x    Stool Occurrence 1 x    Emesis Occurrence 5 x      PHYSICAL EXAMINATION: General:  MO WF , awake and follows commands Neuro:  Intact currently, oriented to person/place HEENT:  No LAN Cardiovascular:  HSIR Lungs:  Decreased bs bases, no wheeze Abdomen:  Obese, soft, mild tenderness Musculoskeletal:  Lower ext non walking from ra and failed joint replacement Skin:  Left heel scab noted  LABS:  Recent Labs Lab 07/07/12 1038 07/08/12 0522 07/09/12 0510 07/09/12 2239 07/10/12 0430 07/10/12 0540  HGB 11.1* 9.8* 9.7*  --  10.4*  --   WBC 7.0 6.2 6.9  --  9.0  --   PLT 101* 100* 101*  --  110*  --   NA 137 138 139  --  141  --   K 5.1 4.7 4.4  --  4.4  --   CL 102 103 106  --  105  --   CO2 24 26 24   --  23  --   GLUCOSE 90 83 91  --  112*  --   BUN 56* 53* 49*  --  45*  --   CREATININE 2.45* 2.22* 2.06*  --  1.84*  --   CALCIUM 9.1 8.6 8.5  --  8.9  --   INR 2.80* 3.31* 3.10*  --  2.56*  --   PROBNP 21555.0*  --   --   --   --   --   PHART  --   --   --  7.461*  --  7.346*  PCO2ART  --   --   --  26.8*  --  39.2  PO2ART  --   --   --  65.9*  --  51.7*    Recent Labs Lab 07/09/12 0741 07/09/12 1147 07/09/12 1657 07/09/12 2229 07/10/12 0756  GLUCAP 88 114* 92 125* 97    CXR:  Dg Abd 1 View  07/10/2012   *RADIOLOGY REPORT*  Clinical Data: Refractory nausea and vomiting.  ABDOMEN - 1 VIEW  Comparison: Abdominal radiograph performed 07/07/2012  Findings: The visualized bowel gas pattern is nonspecific.  There is moderate distension of the stomach with air.  The colon is partially filled with stool and air and is grossly unremarkable in appearance.  No abnormal dilatation of small bowel loops is seen to suggest small bowel obstruction.  No free intra-abdominal air is identified, though evaluation for free air is limited on supine views.  Degenerative change is noted at the lower lumbar spine; the sacroiliac joints are unremarkable in  appearance.  The lung bases are not well characterized due to motion artifact.  IMPRESSION: Nonspecific bowel gas pattern.  Moderate distension of the stomach with air.  This could reflect gastroparesis.  Air noted within small and large bowel loops, suggesting against gastric outlet obstruction.  No free intra-abdominal air seen.   Original Report Authenticated By: Tonia Ghent, M.D.   Dg Chest Port 1 View  07/10/2012   *RADIOLOGY REPORT*  Clinical Data: Respiratory distress.  PORTABLE CHEST - 1 VIEW  Comparison: Chest radiograph performed 07/09/2012  Findings: The lungs are hypoexpanded.  Vascular congestion is seen. Bilateral airspace opacification is noted, left greater than right, similar appearance to the prior study.  This likely reflects asymmetric interstitial edema, though pneumonia might have a similar appearance.  A small left pleural effusion is suspected. No pneumothorax is seen.  The cardiomediastinal silhouette is mildly enlarged.  No acute osseous abnormalities are identified.  The patient's enteric tube is noted extending below the diaphragm.  IMPRESSION:  1.  Lungs hypoexpanded.  Bilateral airspace opacification, left greater than right, similar in appearance to the prior study.  This likely reflects asymmetric interstitial edema, though pneumonia might have a similar appearance.  Suspect small left pleural effusion. 2.  Vascular congestion and mild cardiomegaly noted.   Original Report Authenticated By: Tonia Ghent, M.D.   Dg Chest Port 1  View  07/10/2012   *RADIOLOGY REPORT*  Clinical Data: Respiratory distress  PORTABLE CHEST - 1 VIEW  Comparison: 07/07/2012  Findings: The patient is rotated to the right.  Lungs are markedly hypo aerated with crowding of the bronchovascular markings. Prominence of the hila bilaterally likely reflects vascular congestion in the setting of cardiomegaly. Aorta is ectatic and unfolded.  No pleural effusion.  IMPRESSION: Cardiomegaly with central vascular  congestion and hypoaeration. If the patient's symptoms continue, consider PA and lateral chest radiographs obtained at full inspiration when the patient is clinically able.   Original Report Authenticated By: Christiana Pellant, M.D.     ASSESSMENT / PLAN:  PULMONARY A:Acute on chronic hypoxic resp failure in the setting of MO, OSA, AMS from sedation, lack of mandatory Cpap.  ILD followed by Dr. Shelle Iron of pulmonary. P:   O2 as needed Cpap nocturnal and PRN She may need short term intubation.  CARDIOVASCULAR A: Afib chronic, CAD, CHF P:  Continue BB as BP tolerates Hold coumadin Check BNP   RENAL A:  Acute on chronic renal failure P:   Hold lasix Gentle hydration D/c ace-i  GASTROINTESTINAL A:  Nausea/Vomiting/Suspected GIB/constipation/gastropareiss  P:   Hold coumadin NGT to LWIS Follow abd films May need GI consult in future  HEMATOLOGIC A:  Chronic anticoagulation for AFIB P:  Consider holding coumadin with suspected GIB  INFECTIOUS A:  Ecoli UTI, presumed aspiration, thrush P:   See flows Consider dc vanc and azactam and treat only UTI with rocephin >> defer to primary team  ENDOCRINE A: DM 2  , RA P:   SSI Plaquenil but may need steroids  NEUROLOGIC A:  Decreased LOC  P:   Resolving with no further sedation. DC mso4 and ativan  TODAY'S SUMMARY:  79 yo MO(258 lbs) WF with known OSA and is Cpap dependent. S presented to [facility] 5-30 with ongoing abdominal pain and worsening renal failure. She is on coumadin for Afib and did develop brownish emesis 6-2 and was treated with phenergan for nausea/vomiting. She was moved to the ICU 6-2 for AMS following phenergan and was found to be hypoxic by ABG consistent with hypoventilation. She has improved with high flow O2 via a NRB and is in no acute distress. PCCM has been asked to evaluate. We have stopped mso4 and ativan.  Brett Canales Minor ACNP [facility] PCCM Pager [phone] till 3 pm If no answer page [phone] 07/10/2012,  9:20 AM  Reviewed above, examined pt, and agree with assessment/plan.  She has acute encephalopathy with respiratory distress 2nd to opiate/benzo use in setting of OSA and RA associated ILD.  She has improved respiratory status and mentation.  Try to limit/avoid sedatives and opiates as tolerated.  Defer assessment of abdominal pain to hospitalist team.  Updated pt's daughter in law at bedside.  Coralyn Helling, MD [facility] Pulmonary/Critical Care 07/10/2012, 12:42 PM Pager:  [phone] After 3pm call: [phone]

## 2012-07-10 NOTE — Progress Notes (Signed)
[facility] hospitalist progress note. Chief complaint. Addendum. I saw this 79 year old female earlier the to an incident of dyspnea, tachypnea and tachycardia thought secondary to intolerance of Phenergan. The patient has continued with incidence of vomiting which were initially undigested food but more recently brownish clear material that was sent for gastric occult blood. The gastric occult blood specimen has returned back positive. Because of persistent vomiting an abdominal KUB x-ray was obtained and indicated nonspecific bowel gas pattern. Moderate distention of the stomach with air that could reflect gastroparesis. Air noted within the small and large bowel loops suggestive against gastric outlet obstruction. No free air. Patient continues to require 50% Ventimask to keep O2 sats in normal range. Attempts to wean oxygen have been unsuccessful. Vital signs temperature 100, pulse 96, respirations 22, blood pressure 117/85. O2 sats 95% on 50% Ventimask. General appearance. Obese elderly female who is somnolent but easily arousable. Cardiac. Rate and rhythm regular. Lungs. Breath sounds diminished both bases. Patient has developed new mild expiratory wheezing. Abdomen. Soft and obese with very diminished to absent bowel sounds. No significant pain with palpation. Problem #1 suspected aspiration event. Patient with multiple episodes of vomiting. Earlier chest x-ray did not suggest pneumonia but I suspect the patient did indeed aspirate. Her oxygen requirement has certainly increased and a new wheezing now present. We will move the patient to step down to monitor closely in case she should further decline. I am empirically starting antibiotic therapy with aztreonam and vancomycin per hospital-acquired pneumonia protocol. I've added routine and when necessary Xopenex nebulizers. Problem #2. Recurrent vomiting. No evidence of obstruction per KUB x-ray. The patient has minimal to absent bowel sounds at this time.  History suggests chronic constipation and difficult bowel movements. Given the frequency of vomiting I will request an NG tube to low intermittent wall suction. Defer GI consult to rounding physician. Problem #3. Gastric occult blood positive. Patient is on chronic Coumadin indication the atrial fibrillation. Initially this was undigested food but more recently brown clear material. A specimen was sent for occult blood and this has resulted positive. This going hemoglobin is stable at 10.4. There's no evidence of significant bleeding at this time thus I will not seek to reverse anticoagulation with Coumadin but instead defer this decision to the rounding physician.

## 2012-07-10 NOTE — Progress Notes (Signed)
Pt ripped CPAP mask off and said that it was too uncomfortable and that she didn't want to wear it, pt placed back on 6 LPM [place] and tolerating well at this time, Pt said she may try again later.  RT to monitor and assess as needed.

## 2012-07-10 NOTE — Progress Notes (Addendum)
[facility] PROGRESS NOTE  ALLURE GREASER WUJ:[id number] DOB: 07-07-1931 DOA: 07/07/2012 PCP: [name], MD  Holly [name] is a 79 y.o. female with PMH significant for RA, Diabetes, Diastolic Heart Failure, CAD, A fib on coumadin, CKD who was refer by PCP to the ED for evaluation of worsening renal function. Patient saw her PCP day prior of admission because she was having problem with constipation and decrease urine out put. Patient last BM was 5 days prior to admission. Patient denies abdominal pain. She is passing flatus. She denies vomiting, does relates nausea. She denies dyspnea or chest pain. She has not been able to eat well.  I called PCP office, patient Cr on March 2014 was at 1.6, prior to that October 2013 at 1.3  Assessment/Plan: Nausea with vomiting-NG tube place with large output, no BM before admission for 4-5 days, BM with enema in hospital, order CT scan of belly as patient having abd pain now -consult GI await gastrocult -hold coumadin -try another enema -lactic acid -output appears stool like- Hgb stable   acute resp failure -d/c IVF -possible aspiration PNA -add broad spectrum abx -critical care to see patient -try IV dose lasix  Acute on chronic Renal Failure:  Probably pre renal (ATN) in setting of poor oral intake, diuretic, ACE and UTI.  IV fluids. Strict I and O. Renal U/s ok.  hold ACE, UOP good, baseline CR  About 1.6  UTI: UA with positive nitrates, WBC 21 to 50. Patient with problem with urination.  IV ceftriaxone. Follow urine culture- ecoli.   Diabetes: Will order SSI, HB-A1c of 6   A fib: Continue with metoprolol with holding parameters for hypotension. Hold coumadin  Morbid Obesity  RA: Continue with Plaquenil.   Chronic Diastolic Heart Failure: Monitor for pulmonary edema. Holding lasix due to acute renal failure   thrush- nystatin swish and swallow changed to diflucan  Not ambulatory at baseline- has caregiver in AM and PM  but otherwise is in wheelchair- wears a "brief" that is change in AM and PM only- ? SNF vs patient has called for more help during the day when she is home- patient has capacity  Code Status: full Family Communication: patient at bedside Disposition Plan:    Consultants:  GI  Procedures:    Antibiotics:  rocephin  HPI/Subjective: NG tube in place after continuous vomiting Large amount of output- appears like stool +abd pain  Objective: Filed Vitals:   07/10/12 0600 07/10/12 0613 07/10/12 0700 07/10/12 0800  BP: 132/62  153/100 141/80  Pulse: 84  93 92  Temp:    97.8 F (36.6 C)  TempSrc:    Oral  Resp: 22  22 21   Height:      Weight:      SpO2: 95% 98% 100% 100%    Intake/Output Summary (Last 24 hours) at 07/10/12 0853 Last data filed at 07/10/12 0650  Gross per 24 hour  Intake 2237.5 ml  Output    300 ml  Net 1937.5 ml   Filed Weights   07/08/12 0547 07/09/12 0519 07/10/12 0500  Weight: 113.263 kg (249 lb 11.2 oz) 113.3 kg (249 lb 12.5 oz) 117.3 kg (258 lb 9.6 oz)    Exam:   General:  Appears uncomfortable  Cardiovascular: irregular  Respiratory: no wheezing, decreased at baseline  Abdomen: +BS in all 4 quadrants, obese, non tender  Musculoskeletal: weakness b/L all ext   Data Reviewed: Basic Metabolic Panel:  Recent Labs Lab 07/07/12 1038 07/08/12  0522 07/09/12 0510 07/10/12 0430  NA 137 138 139 141  K 5.1 4.7 4.4 4.4  CL 102 103 106 105  CO2 24 26 24 23   GLUCOSE 90 83 91 112*  BUN 56* 53* 49* 45*  CREATININE 2.45* 2.22* 2.06* 1.84*  CALCIUM 9.1 8.6 8.5 8.9   Liver Function Tests: No results found for this basename: AST, ALT, ALKPHOS, BILITOT, PROT, ALBUMIN,  in the last 168 hours No results found for this basename: LIPASE, AMYLASE,  in the last 168 hours No results found for this basename: AMMONIA,  in the last 168 hours CBC:  Recent Labs Lab 07/07/12 1038 07/08/12 0522 07/09/12 0510 07/10/12 0430  WBC 7.0 6.2 6.9 9.0   NEUTROABS 5.5  --   --   --   HGB 11.1* 9.8* 9.7* 10.4*  HCT 34.8* 32.6* 31.9* 33.8*  MCV 85.5 86.0 86.0 86.0  PLT 101* 100* 101* 110*   Cardiac Enzymes: No results found for this basename: CKTOTAL, CKMB, CKMBINDEX, TROPONINI,  in the last 168 hours BNP (last 3 results)  Recent Labs  07/07/12 1038  PROBNP 21555.0*   CBG:  Recent Labs Lab 07/09/12 0741 07/09/12 1147 07/09/12 1657 07/09/12 2229 07/10/12 0756  GLUCAP 88 114* 92 125* 97    Recent Results (from the past 240 hour(s))  URINE CULTURE     Status: None   Collection Time    07/07/12 12:27 PM      Result Value Range Status   Specimen Description URINE, RANDOM   Final   Special Requests NONE   Final   Culture  Setup Time 07/07/2012 23:01   Final   Colony Count >=100,000 COLONIES/ML   Final   Culture ESCHERICHIA COLI   Final   Report Status 07/09/2012 FINAL   Final   Organism ID, Bacteria ESCHERICHIA COLI   Final  MRSA PCR SCREENING     Status: None   Collection Time    07/10/12  5:33 AM      Result Value Range Status   MRSA by PCR NEGATIVE  NEGATIVE Final   Comment:            The GeneXpert MRSA Assay (FDA     approved for NASAL specimens     only), is one component of a     comprehensive MRSA colonization     surveillance program. It is not     intended to diagnose MRSA     infection nor to guide or     monitor treatment for     MRSA infections.     Studies: Dg Abd 1 View  07/10/2012   *RADIOLOGY REPORT*  Clinical Data: Refractory nausea and vomiting.  ABDOMEN - 1 VIEW  Comparison: Abdominal radiograph performed 07/07/2012  Findings: The visualized bowel gas pattern is nonspecific.  There is moderate distension of the stomach with air.  The colon is partially filled with stool and air and is grossly unremarkable in appearance.  No abnormal dilatation of small bowel loops is seen to suggest small bowel obstruction.  No free intra-abdominal air is identified, though evaluation for free air is limited on  supine views.  Degenerative change is noted at the lower lumbar spine; the sacroiliac joints are unremarkable in appearance.  The lung bases are not well characterized due to motion artifact.  IMPRESSION: Nonspecific bowel gas pattern.  Moderate distension of the stomach with air.  This could reflect gastroparesis.  Air noted within small and large bowel loops, suggesting against  gastric outlet obstruction.  No free intra-abdominal air seen.   Original Report Authenticated By: Tonia Ghent, M.D.   Dg Chest Port 1 View  07/10/2012   *RADIOLOGY REPORT*  Clinical Data: Respiratory distress.  PORTABLE CHEST - 1 VIEW  Comparison: Chest radiograph performed 07/09/2012  Findings: The lungs are hypoexpanded.  Vascular congestion is seen. Bilateral airspace opacification is noted, left greater than right, similar appearance to the prior study.  This likely reflects asymmetric interstitial edema, though pneumonia might have a similar appearance.  A small left pleural effusion is suspected. No pneumothorax is seen.  The cardiomediastinal silhouette is mildly enlarged.  No acute osseous abnormalities are identified.  The patient's enteric tube is noted extending below the diaphragm.  IMPRESSION:  1.  Lungs hypoexpanded.  Bilateral airspace opacification, left greater than right, similar in appearance to the prior study.  This likely reflects asymmetric interstitial edema, though pneumonia might have a similar appearance.  Suspect small left pleural effusion. 2.  Vascular congestion and mild cardiomegaly noted.   Original Report Authenticated By: Tonia Ghent, M.D.   Dg Chest Port 1 View  07/10/2012   *RADIOLOGY REPORT*  Clinical Data: Respiratory distress  PORTABLE CHEST - 1 VIEW  Comparison: 07/07/2012  Findings: The patient is rotated to the right.  Lungs are markedly hypo aerated with crowding of the bronchovascular markings. Prominence of the hila bilaterally likely reflects vascular congestion in the setting of  cardiomegaly. Aorta is ectatic and unfolded.  No pleural effusion.  IMPRESSION: Cardiomegaly with central vascular congestion and hypoaeration. If the patient's symptoms continue, consider PA and lateral chest radiographs obtained at full inspiration when the patient is clinically able.   Original Report Authenticated By: Christiana Pellant, M.D.    Scheduled Meds: . aspirin EC  81 mg Oral QHS  . aztreonam  2 g Intravenous Q8H  . docusate sodium  100 mg Oral BID  . hydroxychloroquine  200 mg Oral BID  . insulin aspart  0-9 Units Subcutaneous TID WC  . levalbuterol  0.63 mg Nebulization Q8H  . mineral oil  1 enema Rectal Once  . nystatin  5 mL Oral QID  . polyethylene glycol  17 g Oral Daily  . rOPINIRole  0.5 mg Oral BID  . simvastatin  20 mg Oral QHS  . sodium chloride  3 mL Intravenous Q12H  . sorbitol, milk of mag, mineral oil, glycerin (SMOG) enema  960 mL Rectal Once  . [START ON 07/12/2012] vancomycin  1,500 mg Intravenous Q48H  . Warfarin - Pharmacist Dosing Inpatient   Does not apply q1800   Continuous Infusions: . sodium chloride 20 mL/hr at 07/10/12 0047    Active Problems:   DIABETES MELLITUS, TYPE II   Morbid obesity   CORONARY ARTERY DISEASE   INTERSTITIAL LUNG DISEASE   Acute on chronic renal failure   Unspecified constipation    Time spent: 35    [facility], Jasilyn Holderman  [facility] Pager [phone]. If 7PM-7AM, please contact night-coverage at [email or web address], password [facility] 07/10/2012, 8:53 AM  LOS: 3 days

## 2012-07-10 NOTE — Progress Notes (Signed)
CARE MANAGEMENT NOTE 07/10/2012  Patient:  Holly [name], Holly [name]   Account Number:  [id number]  Date Initiated:  07/07/2012  Documentation initiated by:  Lanier Clam  Subjective/Objective Assessment:   ADMITTED W/ABNORMAL LAB WORK.WORSENING KIDNEY FUNCTION.HX:DM,CHF,RA.     Action/Plan:   FROM HOME ALONE.HAS W/C,HOYER LIFT,HOME BIPAP.HAS PCP,PHARMACY.HAS PRIVATE SITTERS-[facility]-2HRS IN A-2HRS IN PM SAT-SUN.   Anticipated DC Date:  07/13/2012   Anticipated DC Plan:  HOME/SELF CARE      DC Planning Services  CM consult      Choice offered to / List presented to:             Status of service:  In process, will continue to follow Medicare Important Message given?   (If response is "NO", the following Medicare IM given date fields will be blank) Date Medicare IM given:   Date Additional Medicare IM given:    Discharge Disposition:    Per UR Regulation:  Reviewed for med. necessity/level of care/duration of stay  If discussed at Long Length of Stay Meetings, dates discussed:    Comments:  [id number]/Rhonda [name], RN, BSN, CCM:  CHART REVIEWED AND UPDATED.  Next chart review due on [id number]. NO DISCHARGE NEEDS PRESENT AT THIS TIME. CASE MANAGEMENT [phone]   07/07/12 KATHY MAHABIR RN,BSN NCM [phone] PATIENT IS COMFORTABLE W/HER PRIVATE SITTERS FROM [facility],& SINCE SHE IS W/C BOUND & UNDERSTANDS HOW TO MANAGE HER CARE,& WHEN TO CALL MD THINKS SHE WILL NOT NEED ANY HHC.SHE WILL CONTACT A FRIEND OR NEIGHBOR FOR TRANSP,BUT IF SHE CAN'T CONTACT ANYONE SHE MAY NEED TRANSP.

## 2012-07-10 NOTE — Progress Notes (Signed)
PT Cancellation Note  Patient Details Name: Holly [name] MRN: [id number] DOB: Jul 28, 1931   Cancelled Treatment:    Reason Eval/Treat Not Completed: Medical issues which prohibited therapy Pt moved to ICU after AMS, post IV antivan and MSO4.    Trine Fread,KATHrine E 07/10/2012, 1:31 PM Zenovia Jarred, PT, DPT 07/10/2012 Pager: [phone]

## 2012-07-10 NOTE — Progress Notes (Signed)
ANTIBIOTIC CONSULT NOTE - INITIAL  Pharmacy Consult for vancomycin Indication: pneumonia  Allergies  Allergen Reactions  . Amoxicillin-Pot Clavulanate     Diarrhea ,fatigue mouth sores   . Doxycycline Hyclate   . Iodinated Diagnostic Agents     Full body rash   . Nitrofuran Derivatives   . Norvasc (Amlodipine Besylate)     Leg swelling  . Plavix (Clopidogrel Bisulfate)     REACTION: SOB, and rash  . Sulfa Antibiotics     And adhesive tape  . Phenergan (Promethazine Hcl) Anxiety    Patient Measurements: Height: 5' (152.4 cm) Weight: 258 lb 9.6 oz (117.3 kg) IBW/kg (Calculated) : 45.5 Adjusted Body Weight:   Vital Signs: Temp: 100 F (37.8 C) (06/02 0320) Temp src: Rectal (06/02 0320) BP: 117/85 mmHg (06/02 0301) Pulse Rate: 96 (06/02 0301) Intake/Output from previous day: 06/01 0701 - 06/02 0700 In: 2187.5 [P.O.:840; I.V.:1297.5; IV Piggyback:50] Out: -  Intake/Output from this shift: Total I/O In: 433.8 [I.V.:433.8] Out: -   Labs:  Recent Labs  07/07/12 1038 07/07/12 1250 07/08/12 0522 07/09/12 0510 07/10/12 0430  WBC 7.0  --  6.2 6.9 9.0  HGB 11.1*  --  9.8* 9.7* 10.4*  PLT 101*  --  100* 101* 110*  LABCREA  --  88.33  --   --   --   CREATININE 2.45*  --  2.22* 2.06* 1.84*   Estimated Creatinine Clearance: 29.5 ml/min (by C-G formula based on Cr of 1.84). No results found for this basename: VANCOTROUGH, Leodis Binet, VANCORANDOM, GENTTROUGH, GENTPEAK, GENTRANDOM, TOBRATROUGH, TOBRAPEAK, TOBRARND, AMIKACINPEAK, AMIKACINTROU, AMIKACIN,  in the last 72 hours   Microbiology: Recent Results (from the past 720 hour(s))  URINE CULTURE     Status: None   Collection Time    07/07/12 12:27 PM      Result Value Range Status   Specimen Description URINE, RANDOM   Final   Special Requests NONE   Final   Culture  Setup Time 07/07/2012 23:01   Final   Colony Count >=100,000 COLONIES/ML   Final   Culture ESCHERICHIA COLI   Final   Report Status 07/09/2012 FINAL    Final   Organism ID, Bacteria ESCHERICHIA COLI   Final    Medical History: Past Medical History  Diagnosis Date  . Myalgia and myositis, unspecified   . Esophageal reflux   . Degeneration of intervertebral disc, site unspecified   . Morbid obesity   . Osteoarthrosis, unspecified whether generalized or localized, unspecified site   . Rheumatoid arthritis(714.0)   . Type II or unspecified type diabetes mellitus without mention of complication, not stated as uncontrolled   . CAD (coronary artery disease)   . Chronic kidney disease   . Thrombocytopenia     Medications:  Anti-infectives   Start     Dose/Rate Route Frequency Ordered Stop   07/12/12 0600  vancomycin (VANCOCIN) 1,500 mg in sodium chloride 0.9 % 500 mL IVPB     1,500 mg 250 mL/hr over 120 Minutes Intravenous Every 48 hours 07/10/12 0546     07/10/12 0600  aztreonam (AZACTAM) 2 g in dextrose 5 % 50 mL IVPB     2 g 100 mL/hr over 30 Minutes Intravenous 3 times per day 07/10/12 0524 07/18/12 0559   07/10/12 0600  vancomycin (VANCOCIN) 1,750 mg in sodium chloride 0.9 % 500 mL IVPB     1,750 mg 250 mL/hr over 120 Minutes Intravenous  Once 07/10/12 0545     07/07/12 2200  hydroxychloroquine (PLAQUENIL) tablet 200 mg     200 mg Oral 2 times daily 07/07/12 1449     07/07/12 1330  cefTRIAXone (ROCEPHIN) 1 g in dextrose 5 % 50 mL IVPB  Status:  Discontinued     1 g 100 mL/hr over 30 Minutes Intravenous Every 24 hours 07/07/12 1315 07/10/12 0524     Assessment: Patient with antibiotics started for PNA for patient with transfer to ICU-SD.  Patient with poor renal function at < 37mL/min  Goal of Therapy:  Vancomycin trough level 15-20 mcg/ml Aztreonam dosed based on patient weight and renal function   Plan:  Measure antibiotic drug levels at steady state Follow up culture results Vancomycin 1750mg  iv x1, then 1500mg  iv q48hr Change aztreonam to 1gm iv q8hr for now due to renal function.  Darlina Guys, Jacquenette Shone  [place] 07/10/2012,5:56 AM

## 2012-07-10 NOTE — Consult Note (Signed)
[facility] Consultation  Referring Provider:     [facility] Primary Care Physician:  [name], MD Primary Gastroenterologist:      none   Reason for Consultation: Nausea and vomiting              HPI:   Patient is a 79 year old female with multiple medical problems admitted 07/07/12 after labs at PCP office revealed worsening renal function. Patient had been to see PCP for constipation as she had not had a BM in 4 days. Patient has chronically altered bowel habits swinging between constipation and loose stool. She takes anti-diarrheals as needed but had not had any recently. Prior to seeing PCP patient had taken Miralax for 3 days. Once admitted patient got an enema and had a BM on Saturday. Patient gives a history of chronic nausea with occasional vomiting.  Last night she began having vomiting with some coffee ground emesis. Some mental status changes were noted, patient was transferred to ICU where she is being treated for acute on chronic hypoxia. For the vomiting an NGT was placed and there was concern about feculent output. . KUB today negative except for moderate gastric distention. CTscan today also negative for obstructive process.   Patient does endorse some "gut pain" over last 3-4 weeks but attributes that to constipation.   Past Medical History  Diagnosis Date  . Myalgia and myositis, unspecified   . Esophageal reflux   . Degeneration of intervertebral disc, site unspecified   . Morbid obesity   . Osteoarthrosis, unspecified whether generalized or localized, unspecified site   . Rheumatoid arthritis(714.0)   . Type II or unspecified type diabetes mellitus without mention of complication, not stated as uncontrolled   . CAD (coronary artery disease)   . Chronic kidney disease   . Thrombocytopenia     Past Surgical History  Procedure Laterality Date  . Appendectomy    . Rotator cuff repair    . Replacement total knee    . Tubal ligation    .  Ankle fusion      Family History  Problem Relation Age of Onset  . Rheum arthritis Mother     No FMH of colon cancer  History  Substance Use Topics  . Smoking status: Former Smoker -- 1.00 packs/day for 17 years    Types: Cigarettes    Quit date: 02/08/1977  . Smokeless tobacco: Never Used  . Alcohol Use: No    Prior to Admission medications   Medication Sig Start Date End Date Taking? Authorizing Provider  aspirin EC 81 MG tablet Take 81 mg by mouth at bedtime.    Yes Historical Provider, MD  calcium-vitamin D (OSCAL WITH D) 500-200 MG-UNIT per tablet Take 1 tablet by mouth at bedtime.    Yes Historical Provider, MD  Coenzyme Q10 (COQ10) 100 MG CAPS Take 1 capsule by mouth 2 (two) times daily.   Yes Historical Provider, MD  furosemide (LASIX) 20 MG tablet Take 40 mg by mouth daily.    Yes Historical Provider, MD  HYDROcodone-acetaminophen (LORTAB) 10-500 MG per tablet Take 1 tablet by mouth 2 (two) times daily as needed.   Yes Historical Provider, MD  hydroxychloroquine (PLAQUENIL) 200 MG tablet Take 200 mg by mouth 2 (two) times daily.    Yes Historical Provider, MD  lisinopril (PRINIVIL,ZESTRIL) 10 MG tablet Take 5 mg by mouth daily.    Yes Historical Provider, MD  metoprolol (LOPRESSOR) 50 MG tablet Take 50 mg by mouth 2 (two)  times daily.     Yes Historical Provider, MD  Multiple Vitamins-Minerals (CERTAVITE/ANTIOXIDANTS PO) Take 1 tablet by mouth daily.    Yes Historical Provider, MD  NON FORMULARY Ultimate eye support (6 berries) daily   Yes Historical Provider, MD  Omega-3 Fatty Acids (FISH OIL) 1000 MG CAPS Take 1 capsule by mouth 2 (two) times daily.    Yes Historical Provider, MD  rOPINIRole (REQUIP) 0.5 MG tablet Take 0.5 mg by mouth 2 (two) times daily.     Yes Historical Provider, MD  simvastatin (ZOCOR) 20 MG tablet Take 20 mg by mouth at bedtime. 1 by mouth daily 06/16/11  Yes Historical Provider, MD  Vanadium 7.5 MG CAPS Take 7.5 mg by mouth daily. As directed   Yes  Historical Provider, MD  warfarin (COUMADIN) 5 MG tablet 2.5-5 mg daily. Take 1 tab MWF, 1/2 tab all others 05/08/11  Yes Historical Provider, MD  ACCU-CHEK AVIVA PLUS test strip  12/04/11   Historical Provider, MD  Lancets (ACCU-CHEK MULTICLIX) lancets  12/04/11   Historical Provider, MD  ondansetron (ZOFRAN) 4 MG tablet Take 4 mg by mouth as needed.  11/22/11   Historical Provider, MD    Current Facility-Administered Medications  Medication Dose Route Frequency Provider Last Rate Last Dose  . 0.9 %  sodium chloride infusion   Intravenous Continuous Rolan Lipa, NP 20 mL/hr at 07/10/12 0047    . acetaminophen (TYLENOL) tablet 650 mg  650 mg Oral Q6H PRN Belkys A Regalado, MD       Or  . acetaminophen (TYLENOL) suppository 650 mg  650 mg Rectal Q6H PRN Belkys A Regalado, MD      . aztreonam (AZACTAM) 2 g in dextrose 5 % 50 mL IVPB  2 g Intravenous Q8H Rolan Lipa, NP   2 g at 07/10/12 0555  . fluconazole (DIFLUCAN) IVPB 100 mg  100 mg Intravenous Daily Joseph Art, DO      . insulin aspart (novoLOG) injection 0-9 Units  0-9 Units Subcutaneous Q4H Joseph Art, DO      . levalbuterol (XOPENEX) nebulizer solution 0.63 mg  0.63 mg Nebulization Q8H Rolan Lipa, NP   0.63 mg at 07/10/12 1610  . levalbuterol (XOPENEX) nebulizer solution 0.63 mg  0.63 mg Nebulization Q4H PRN Rolan Lipa, NP      . mineral oil enema 1 enema  1 enema Rectal Once Belkys A Regalado, MD      . ondansetron (ZOFRAN) injection 4 mg  4 mg Intravenous Q6H PRN Rolan Lipa, NP   4 mg at 07/10/12 0209  . pantoprazole (PROTONIX) injection 40 mg  40 mg Intravenous Q12H William S Minor, NP      . simvastatin (ZOCOR) tablet 20 mg  20 mg Oral QHS Belkys A Regalado, MD   20 mg at 07/08/12 2144  . sodium chloride 0.9 % injection 3 mL  3 mL Intravenous Q12H Belkys A Regalado, MD   3 mL at 07/08/12 2144  . sorbitol, milk of mag, mineral oil, glycerin (SMOG) enema  960 mL  Rectal Once Joseph Art, DO      . [START ON 07/12/2012] vancomycin (VANCOCIN) 1,500 mg in sodium chloride 0.9 % 500 mL IVPB  1,500 mg Intravenous Q48H Jessica U Vann, DO        Allergies as of 07/07/2012 - Review Complete 07/07/2012  Allergen Reaction Noted  . Amoxicillin-pot clavulanate  12/20/2011  . Doxycycline hyclate  12/20/2011  . Iodinated diagnostic  agents  05/18/2010  . Nitrofuran derivatives  12/20/2011  . Norvasc (amlodipine besylate)  05/18/2010  . Plavix (clopidogrel bisulfate)  05/22/2009  . Sulfa antibiotics  12/18/2010   Review of Systems:    All systems reviewed and negative except where noted in HPI.   Physical Exam:  Vital signs in last 24 hours: Temp:  [97.4 F (36.3 C)-100 F (37.8 C)] 97.8 F (36.6 C) (06/02 0800) Pulse Rate:  [77-125] 86 (06/02 1100) Resp:  [18-24] 24 (06/02 1100) BP: (117-153)/(62-100) 129/80 mmHg (06/02 1100) SpO2:  [82 %-100 %] 98 % (06/02 1100) FiO2 (%):  [40 %-50 %] 40 % (06/02 1100) Weight:  [258 lb 9.6 oz (117.3 kg)] 258 lb 9.6 oz (117.3 kg) (06/02 0500) Last BM Date: 07/09/12 (pt stated very small) General:   Pleasant morbidly obese white female in NAD Head:  Normocephalic and atraumatic. Eyes:   No icterus.   Conjunctiva pink. Ears:  Normal auditory acuity. Neck:  Supple; no masses felt Lungs:  Non rebreather mask in place.  Heart:  Regular rate and rhythm Abdomen:  Soft, morbidly obese, nontender. Normal bowel sounds. No appreciable masses but limited exam secondary to body habitus. Rectal:  Light brown, soft stool in vault..  Msk:  Symmetrical without gross deformities.  Extremities:  Pitting edema bilateral upper and lower extremities. Bilateral deformities of feet. Neurologic:  Alert and  oriented x4;  grossly normal neurologically. Skin:  Intact without significant lesions or rashes. Cervical Nodes:  No significant cervical adenopathy. Psych:  Alert and cooperative. Normal affect.  LAB RESULTS:  Recent Labs   07/08/12 0522 07/09/12 0510 07/10/12 0430 07/10/12 0845  WBC 6.2 6.9 9.0  --   HGB 9.8* 9.7* 10.4* 10.8*  HCT 32.6* 31.9* 33.8* 33.4*  PLT 100* 101* 110*  --    BMET  Recent Labs  07/08/12 0522 07/09/12 0510 07/10/12 0430  NA 138 139 141  K 4.7 4.4 4.4  CL 103 106 105  CO2 26 24 23   GLUCOSE 83 91 112*  BUN 53* 49* 45*  CREATININE 2.22* 2.06* 1.84*  CALCIUM 8.6 8.5 8.9   PT/INR  Recent Labs  07/09/12 0510 07/10/12 0430  LABPROT 30.3* 26.3*  INR 3.10* 2.56*    STUDIES: Dg Abd 1 View  07/10/2012   *RADIOLOGY REPORT*  Clinical Data: Refractory nausea and vomiting.  ABDOMEN - 1 VIEW  Comparison: Abdominal radiograph performed 07/07/2012  Findings: The visualized bowel gas pattern is nonspecific.  There is moderate distension of the stomach with air.  The colon is partially filled with stool and air and is grossly unremarkable in appearance.  No abnormal dilatation of small bowel loops is seen to suggest small bowel obstruction.  No free intra-abdominal air is identified, though evaluation for free air is limited on supine views.  Degenerative change is noted at the lower lumbar spine; the sacroiliac joints are unremarkable in appearance.  The lung bases are not well characterized due to motion artifact.  IMPRESSION: Nonspecific bowel gas pattern.  Moderate distension of the stomach with air.  This could reflect gastroparesis.  Air noted within small and large bowel loops, suggesting against gastric outlet obstruction.  No free intra-abdominal air seen.   Original Report Authenticated By: Tonia Ghent, M.D.   Dg Chest Port 1 View  07/10/2012   *RADIOLOGY REPORT*  Clinical Data: Respiratory distress.  PORTABLE CHEST - 1 VIEW  Comparison: Chest radiograph performed 07/09/2012  Findings: The lungs are hypoexpanded.  Vascular congestion is seen. Bilateral airspace  opacification is noted, left greater than right, similar appearance to the prior study.  This likely reflects asymmetric  interstitial edema, though pneumonia might have a similar appearance.  A small left pleural effusion is suspected. No pneumothorax is seen.  The cardiomediastinal silhouette is mildly enlarged.  No acute osseous abnormalities are identified.  The patient's enteric tube is noted extending below the diaphragm.  IMPRESSION:  1.  Lungs hypoexpanded.  Bilateral airspace opacification, left greater than right, similar in appearance to the prior study.  This likely reflects asymmetric interstitial edema, though pneumonia might have a similar appearance.  Suspect small left pleural effusion. 2.  Vascular congestion and mild cardiomegaly noted.   Original Report Authenticated By: Tonia Ghent, M.D.   Dg Chest Port 1 View  07/10/2012   *RADIOLOGY REPORT*  Clinical Data: Respiratory distress  PORTABLE CHEST - 1 VIEW  Comparison: 07/07/2012  Findings: The patient is rotated to the right.  Lungs are markedly hypo aerated with crowding of the bronchovascular markings. Prominence of the hila bilaterally likely reflects vascular congestion in the setting of cardiomegaly. Aorta is ectatic and unfolded.  No pleural effusion.  IMPRESSION: Cardiomegaly with central vascular congestion and hypoaeration. If the patient's symptoms continue, consider PA and lateral chest radiographs obtained at full inspiration when the patient is clinically able.   Original Report Authenticated By: Christiana Pellant, M.D.   PREVIOUS ENDOSCOPIES:            colonoscopy maybe 15 years ago. She doesn't know results or who did procedure    Impression / Plan:   38. 79 year old female with multiple medical problems including, but not limited to, morbid obesity, RA, diabetes, CKD, and interstitial lung disease followed by Dr Shelle Iron   2. Altered bowel habits (chronic) described as alternating constipation  / loose stool. Suspect multifactorial (immobility, medications, maybe IBS). Currently with acute constipation.  KUB / CTscan negative for obstructive  process. She had a BM two days ago and a small one on exam today. Will review KUB from today, if still has excessive stool then continue with ordered SMOG enema.    3. Acute on chronic nausea / vomiting with coffee ground emesis last night. Stools very light brown, hgb stable, BUN actually improving so doubt significant GI bleed at this point. Gastric distention (air) on KUB. Suspect she has diabetic and narcotic induced gastroparesis given chronicity but not sure why the acute vomiting.  Coffee ground emesis could be secondary to gastritis, erosive esophagitis. She does take a baby asa at home. Not on PPI at home. Continue BID IV PPI, monitor hgb. Coumadin currently on hold   4. Chronic normocytic anemia. After volume repletion her hgb is around baseline (mid 10 range), despite hematemesis last night.   5. Cirrhosis by CTscan with large volume ascites / anasarca. Her BNP is markedly elevated. Other than ascites which could be from heart failure, no portal hypertensive changes seen on CTscan (splenomegaly, varices, etc...) She is coagulopathic but takes coumadin.   Cirrhosis probably secondary to NASH but will.check for chronic viral hepatitis  She will need dietary instructions for 2gm sodium diet at home  Diuresis when renal function allows.   Ideally patient needs EGD for variceal screening, especially since she is on chronic anticoagulants but she is high risk for procedure.    Ideally, at some point patient would get diagnostic paracentesis (when INR down, acute medical issues improve). Given overall condition of patient, diagnostic paracentesis may not even be feasible.  6. Rheumatoid arthritis, on Plaquenil and hydrocodone   Thanks   LOS: 3 days   Willette Cluster  07/10/2012, 12:01 PM Attending MD note:   I have reviewed the above note, examined the patient and agree with plan of treatment. Several GI issues- low volume emesis  With coffee ground, likely gastritis, would treat  empirically as we are doing. She has an impaction on my exam- large amount of heme negative soft stool. Enemas ordered. Repeat KUB in am. Will follow with you.  [facility] Pager # [phone]

## 2012-07-10 NOTE — Progress Notes (Signed)
After contacting rapid response team and Everett Graff, NP regarding status of patient it was decided that patient needed to be transferred down to step down for closer monitoring.  Called report in to [facility] and also called Mrs. Cassity son and left a voicemail notifying him she was being transferred to room 1234.    Ernesta Amble, RN

## 2012-07-10 NOTE — Progress Notes (Signed)
eLink Physician-Brief Progress Note Patient Name: Holly [name] DOB: March 08, 1931 MRN: [id number]  Date of Service  07/10/2012   HPI/Events of Note  Camera check on patient - found to have increased WOB with AMS.  Report from  Bedside nurse was a reaction to phenergan with change in MS on floor.  ABG shows hypoxia on venti mask with RR of 22 but use of accessory muscles.     eICU Interventions  Plan: Discuss case with primary team Order for [facility] Increase O2 to achieve higher O2 saturation PCCM to evaluate patient at bedside.   Intervention Category Intermediate Interventions: Respiratory distress - evaluation and management  Markese Bloxham 07/10/2012, 6:21 AM

## 2012-07-11 ENCOUNTER — Inpatient Hospital Stay (HOSPITAL_COMMUNITY): Payer: Medicare Other

## 2012-07-11 DIAGNOSIS — I2789 Other specified pulmonary heart diseases: Secondary | ICD-10-CM

## 2012-07-11 DIAGNOSIS — R188 Other ascites: Secondary | ICD-10-CM

## 2012-07-11 DIAGNOSIS — J8410 Pulmonary fibrosis, unspecified: Secondary | ICD-10-CM

## 2012-07-11 DIAGNOSIS — K7460 Unspecified cirrhosis of liver: Secondary | ICD-10-CM

## 2012-07-11 DIAGNOSIS — G4733 Obstructive sleep apnea (adult) (pediatric): Secondary | ICD-10-CM

## 2012-07-11 LAB — HEMOGLOBIN AND HEMATOCRIT, BLOOD
HCT: 32.2 % — ABNORMAL LOW (ref 36.0–46.0)
HCT: 35.1 % — ABNORMAL LOW (ref 36.0–46.0)
Hemoglobin: 10.2 g/dL — ABNORMAL LOW (ref 12.0–15.0)

## 2012-07-11 LAB — CBC
MCH: 26.9 pg (ref 26.0–34.0)
MCV: 86.3 fL (ref 78.0–100.0)
Platelets: ADEQUATE 10*3/uL (ref 150–400)
RBC: 3.94 MIL/uL (ref 3.87–5.11)
RDW: 19.3 % — ABNORMAL HIGH (ref 11.5–15.5)
WBC: 9.5 10*3/uL (ref 4.0–10.5)

## 2012-07-11 LAB — GLUCOSE, CAPILLARY
Glucose-Capillary: 101 mg/dL — ABNORMAL HIGH (ref 70–99)
Glucose-Capillary: 67 mg/dL — ABNORMAL LOW (ref 70–99)
Glucose-Capillary: 88 mg/dL (ref 70–99)
Glucose-Capillary: 89 mg/dL (ref 70–99)
Glucose-Capillary: 94 mg/dL (ref 70–99)

## 2012-07-11 LAB — HEPATITIS C ANTIBODY: HCV Ab: NEGATIVE

## 2012-07-11 LAB — COMPREHENSIVE METABOLIC PANEL
ALT: 21 U/L (ref 0–35)
AST: 63 U/L — ABNORMAL HIGH (ref 0–37)
Albumin: 2.9 g/dL — ABNORMAL LOW (ref 3.5–5.2)
CO2: 21 mEq/L (ref 19–32)
Calcium: 8.9 mg/dL (ref 8.4–10.5)
Chloride: 107 mEq/L (ref 96–112)
Creatinine, Ser: 1.38 mg/dL — ABNORMAL HIGH (ref 0.50–1.10)
GFR calc non Af Amer: 36 mL/min — ABNORMAL LOW (ref >90)
Sodium: 142 mEq/L (ref 135–145)

## 2012-07-11 LAB — HEPATITIS B SURFACE ANTIGEN: Hepatitis B Surface Ag: NEGATIVE

## 2012-07-11 LAB — PROTIME-INR: Prothrombin Time: 21.8 seconds — ABNORMAL HIGH (ref 11.6–15.2)

## 2012-07-11 MED ORDER — LIP MEDEX EX OINT
TOPICAL_OINTMENT | CUTANEOUS | Status: DC | PRN
  Filled 2012-07-11: qty 7

## 2012-07-11 MED ORDER — DEXTROSE 5 % IV SOLN
1.0000 g | INTRAVENOUS | Status: AC
  Administered 2012-07-11 – 2012-07-13 (×3): 1 g via INTRAVENOUS
  Filled 2012-07-11 (×3): qty 10

## 2012-07-11 MED ORDER — VANCOMYCIN HCL 10 G IV SOLR
1500.0000 mg | INTRAVENOUS | Status: DC
  Administered 2012-07-11: 1500 mg via INTRAVENOUS
  Filled 2012-07-11: qty 1500

## 2012-07-11 MED ORDER — PERFLUTREN LIPID MICROSPHERE
1.0000 mL | INTRAVENOUS | Status: AC | PRN
  Administered 2012-07-11: 2 mL via INTRAVENOUS
  Filled 2012-07-11: qty 10

## 2012-07-11 NOTE — Progress Notes (Signed)
Echocardiogram 2D Echocardiogram has been performed.  Holly [name] 07/11/2012, 3:17 PM

## 2012-07-11 NOTE — Clinical Social Work Note (Signed)
CSW assessed Pt on 6/1 and signed off as plan was to go to independent apartment with caregivers and hh. ICU CSW was reconsulted and met with Pt and daughter in law. Plan continues to be home to apartment at [facility] with lift and private duty nursing. CSW explained to Pt and daughter in law that Pt may have skilled needs and if they were interested in SNF. They decline SNF and are aware of process, options and need for auth in advance if they want SNF. They decline SNF and plan to return home with [facility] and caregivers. Family has used [place] in the past. CSW again is signing off.  RN CM aware and will assist with d/c needs.   Doreen Salvage, LCSW ICU/Stepdown Clinical Social Worker [facility] Cell [phone] Hours 8am-1200pm M-F

## 2012-07-11 NOTE — Progress Notes (Signed)
[facility]  Patient tolerated NGT clamping. Will try clears for dinner. If tolerates then will d/c NGT.   Willette Cluster, NP-C

## 2012-07-11 NOTE — Progress Notes (Signed)
[facility] Progress Note   Subjective  up in chair. No nausea, no abdominal pain. Good results with enema last night.    Objective   Vital signs in last 24 hours: Temp:  [96.8 F (36 C)-99.2 F (37.3 C)] 98.2 F (36.8 C) (06/03 0410) Pulse Rate:  [84-108] 85 (06/03 0410) Resp:  [15-28] 21 (06/03 0410) BP: (82-146)/(33-99) 109/43 mmHg (06/03 0410) SpO2:  [93 %-100 %] 95 % (06/03 0444) FiO2 (%):  [35 %-50 %] 35 % (06/02 1426) Weight:  [253 lb 8.5 oz (115 kg)] 253 lb 8.5 oz (115 kg) (06/03 0500) Last BM Date: 07/10/12 General:    Pleasant, obese white female in NAD Abdomen:  Soft, obese, subcutaneous edema. Bowel sounds c/w wall suction. Extremities:  Pitting edema bilateral upper and lower extremities.  Neurologic:  Alert and oriented,  grossly normal neurologically. Psych:  Cooperative. Normal mood and affect.   Lab Results:  Recent Labs  07/09/12 0510 07/10/12 0430  07/10/12 1703 07/11/12 0045 07/11/12 0825  WBC 6.9 9.0  --   --  9.5  --   HGB 9.7* 10.4*  < > 10.6* 10.6* 10.2*  HCT 31.9* 33.8*  < > 32.9* 34.0* 32.2*  PLT 101* 110*  --   --  PLATELET CLUMPS NOTED ON SMEAR, COUNT APPEARS ADEQUATE  --   < > = values in this interval not displayed. BMET  Recent Labs  07/10/12 0430 07/10/12 1704 07/11/12 0045  NA 141 144 142  K 4.4 4.1 4.8  CL 105 108 107  CO2 23 24 21   GLUCOSE 112* 74 87  BUN 45* 42* 40*  CREATININE 1.84* 1.60* 1.38*  CALCIUM 8.9 8.7 8.9   LFT  Recent Labs  07/11/12 0045  PROT 6.5  ALBUMIN 2.9*  AST 63*  ALT 21  ALKPHOS 71  BILITOT 0.6   PT/INR  Recent Labs  07/10/12 0430 07/11/12 0045  LABPROT 26.3* 21.8*  INR 2.56* 1.99*    Studies/Results: Ct Abdomen Pelvis Wo Contrast  07/10/2012   *RADIOLOGY REPORT*  Clinical Data: Abdominal pain.  CT ABDOMEN AND PELVIS WITHOUT CONTRAST  Technique:  Multidetector CT imaging of the abdomen and pelvis was performed following the standard protocol without intravenous contrast.   Comparison: None.  Findings: The lung bases demonstrate small bilateral pleural effusions and bibasilar atelectasis.  The heart is enlarged and there are extensive dense coronary artery calcifications.  There is an NG tube coursing down the esophagus and into the stomach.  The tip is in the second portion of the duodenum.  The liver contour is irregular suggesting cirrhosis.  There is extensive abdominal and pelvic ascites along with diffuse anasarca. No focal hepatic lesions or intrahepatic biliary dilatation.  No common bile duct dilatation.  The gallbladder appears normal.  The spleen is normal in size.  The pancreas is grossly normal.  Both adrenal glands are slightly enlarged but no discrete mass.  The kidneys are grossly normal.  No renal calculi or hydronephrosis.  The stomach, duodenum, small bowel and colon are grossly normal without oral contrast.  No findings for obstruction and/or perforation.  Extensive vascular calcifications without definite aneurysm.  No mesenteric or retroperitoneal mass or adenopathy.  There is a Foley catheter in the bladder.  The uterus and ovaries are grossly normal.  Large volume pelvic ascites.  No mass or adenopathy.  There are enlarged inguinal lymph nodes bilaterally.  The bony structures are grossly intact.  Advanced osteoporosis and degenerative changes.  IMPRESSION:  1.  Large volume abdominal/pelvic ascites and diffuse anasarca. 2.  Irregular contour of the liver suggesting cirrhosis. 3.  Advanced atherosclerotic calcifications involving the major vascular structures. 4.  The NG tube tip is in the second portion of the duodenum.   Original Report Authenticated By: Rudie Meyer, M.D.    Dg Chest Port 1 View  07/11/2012   *RADIOLOGY REPORT*  Clinical Data: Shortness of breath, congestive heart failure  PORTABLE CHEST - 1 VIEW  Comparison: Portable chest x-ray of 07/10/2012  Findings: Aeration has improved slightly.  There is still evidence of pulmonary vascular  congestion with probable bilateral pleural effusions.  Cardiomegaly is stable.  An NG tube tip seen only to the lower esophagus, not visualized below the hemidiaphragm on the current film.  IMPRESSION: Slightly better aeration.  Persistent mild CHF.   Original Report Authenticated By: Dwyane Dee, M.D.    Dg Abd 2 Views  07/11/2012   *RADIOLOGY REPORT*  Clinical Data: Small bowel obstruction.  Fecal impaction.  ABDOMEN - 2 VIEW  Comparison: Radiographs and CT scan dated 07/10/2012  Findings: NG tube has been inserted and the stomach is now decompressed.  There is no free air in the abdomen.  There are no dilated loops of bowel. No appreciable stool in the rectum.  No acute osseous abnormality.  IMPRESSION:  1.  No fecal impaction. 2.  No small bowel obstruction. 3.  Stomach has been decompressed by the NG tube.   Original Report Authenticated By: Francene Boyers, M.D.     Assessment / Plan:   1. Chronic constipation / diarrhea, currently constipated. Good results with enemas last night. Today's KUB    2. Acute on chronic nausea with vomiting of coffee ground emesis this admission. Stomach decompressed on today's KUB.   Will clamp NGT and see how she does.   3. Low volume hematemesis, probably gastritis or esophagitis. Hgb stable at 10.2. NGT output not bloody.   Continue BID PPI, prn anti-emetics.  4. Cirrhosis, probably NASH, cardiac cirrhosis not excluded. No focal liver lesions on non-contrast CTscan. HBV, HCV negative. Large volume ascites / anasarca on scan which may be combination of cirrhosis and CHF.   Will need diuresis when renal function allows.   Will consider diagnostic paracentesis when renal function and INR improve. We can possibly do a large volume paracentesis with fluid studies if medical issues stable.   I spoke with patient and daughter about cirrhosis and need for longterm follow up. RN will give them printed information on cirrhosis and 2gm sodium diet.   5. Acute on  chronic respiratory failure.  6. UTI (e.coli), on antibiotics  7. Afib, home coumadin on hold.   8. Multiple medical problems.   LOS: 4 days   Willette Cluster  07/11/2012, 9:15 AM  I have personally taken an interval history, reviewed the chart, and examined the patient.  I agree with the extender's note, impression and recommendations.  As renal function improves she should have a diagnostic paracentesis and begin diuretics.  Eventually she should have an EGD to r/o varices.  Check alpha feto protein, serologies for hep A and B and vaccinate accordingly.  Barbette Hair. Arlyce Dice, MD, [facility] [phone]

## 2012-07-11 NOTE — Evaluation (Signed)
Physical Therapy Evaluation Patient Details Name: Holly [name] MRN: [id number] DOB: [birth year] Today's Date: 07/11/2012 Time: 6962-9528 PT Time Calculation (min): 30 min  PT Assessment / Plan / Recommendation Clinical Impression  79 yo female admitted with acute on chronic renal failure. Hx of RA, Afib. Prior to admission pt was total assist for all aspects of care; aides used hoyer lift for bed<wheelchair. On eval, pt required Max assist for rolling in bed. Used hoyer lift for bed>recliner since pt is unable to stand/pivot. Family present and states plan is for pt to d/c to ALF once discharged from hospital (so she can receive more care). No PT needs at this time. Pt is at baseline level of functioning with respect to mobility. Recommend daily mobility with nursing using hoyer lift. 1 x eval.     PT Assessment  Patent does not need any further PT services    Follow Up Recommendations  No PT follow up    Does the patient have the potential to tolerate intense rehabilitation      Barriers to Discharge        Equipment Recommendations  None recommended by PT    Recommendations for Other Services     Frequency      Precautions / Restrictions Precautions Precautions: Fall Restrictions Weight Bearing Restrictions: No   Pertinent Vitals/Pain Throat-unrated      Mobility  Bed Mobility Bed Mobility: Rolling Right;Rolling Left Rolling Right: 2: Max assist;With rail Rolling Left: 2: Max assist;With rail Details for Bed Mobility Assistance: Utilized bed pad to aid with positioning.  Transfers Transfers: Not assessed Details for Transfer Assistance: pt unable Ambulation/Gait Ambulation/Gait Assistance: Not tested (comment) Ambulation/Gait Assistance Details: pt unable    Exercises     PT Diagnosis:    PT Problem List:   PT Treatment Interventions:     PT Goals    Visit Information  Last PT Received On: 07/11/12 Assistance Needed: +2    Subjective Data  Subjective:  If feels good to dangle my legs Patient Stated Goal: ALF to recieve more care   Prior Functioning  Home Living Lives With: Alone Available Help at Discharge: Personal care attendant (2 hrs am/ 2 hrs pm) Type of Home: House Home Adaptive Equipment: Other (comment);Hospital bed;Wheelchair - manual (hoveround) Prior Function Level of Independence: Needs assistance Needs Assistance: Transfers;Toileting;Grooming;Bathing;Dressing;Meal Prep;Light Housekeeping Bath: Total Dressing: Total Grooming: Total Toileting: Total Meal Prep: Total Light Housekeeping: Total Transfer Assistance: hoyer lift, bed to wheelchair Able to Take Stairs?: No Driving: No Comments: pt wears depends. caregivers change/perform hygience when they come in Communication Communication: No difficulties    Cognition  Cognition Arousal/Alertness: Awake/alert Behavior During Therapy: WFL for tasks assessed/performed Overall Cognitive Status: Within Functional Limits for tasks assessed    Extremity/Trunk Assessment Right Lower Extremity Assessment RLE ROM/Strength/Tone: Deficits RLE ROM/Strength/Tone Deficits: Knee ext 3-/5. Noted foot/ankle deformity.  Left Lower Extremity Assessment LLE ROM/Strength/Tone: Deficits LLE ROM/Strength/Tone Deficits: Knee ext 3-/5. Noted foot/ankle deformity.    Balance    End of Session PT - End of Session Activity Tolerance: Patient tolerated treatment well Patient left: in chair;with call bell/phone within reach;with family/visitor present  GP     Rebeca Alert, MPT Pager: [phone]

## 2012-07-11 NOTE — Progress Notes (Signed)
ANTIBIOTIC CONSULT NOTE - Follow-up  Pharmacy Consult for vancomycin Indication: pneumonia  Allergies  Allergen Reactions  . Amoxicillin-Pot Clavulanate     Diarrhea ,fatigue mouth sores   . Doxycycline Hyclate   . Iodinated Diagnostic Agents     Full body rash   . Nitrofuran Derivatives   . Norvasc (Amlodipine Besylate)     Leg swelling  . Plavix (Clopidogrel Bisulfate)     REACTION: SOB, and rash  . Sulfa Antibiotics     And adhesive tape  . Phenergan (Promethazine Hcl) Anxiety    Patient Measurements: Height: 5' (152.4 cm) Weight: 253 lb 8.5 oz (115 kg) IBW/kg (Calculated) : 45.5 Adjusted Body Weight:   Vital Signs: Temp: 98.6 F (37 C) (06/03 0800) Temp src: Oral (06/03 0800) BP: 109/43 mmHg (06/03 0410) Pulse Rate: 85 (06/03 0410) Intake/Output from previous day: 06/02 0701 - 06/03 0700 In: 1690 [I.V.:480; IV Piggyback:1200] Out: 2825 [Urine:675; Emesis/NG output:2150] Intake/Output from this shift: Total I/O In: 150 [I.V.:150] Out: 150 [Urine:100; Emesis/NG output:50]  Labs:  Recent Labs  07/09/12 0510 07/10/12 0430  07/10/12 1703 07/10/12 1704 07/11/12 0045 07/11/12 0825  WBC 6.9 9.0  --   --   --  9.5  --   HGB 9.7* 10.4*  < > 10.6*  --  10.6* 10.2*  PLT 101* 110*  --   --   --  PLATELET CLUMPS NOTED ON SMEAR, COUNT APPEARS ADEQUATE  --   CREATININE 2.06* 1.84*  --   --  1.60* 1.38*  --   < > = values in this interval not displayed. Estimated Creatinine Clearance: 38.9 ml/min (by C-G formula based on Cr of 1.38). No results found for this basename: VANCOTROUGH, Leodis Binet, VANCORANDOM, GENTTROUGH, GENTPEAK, GENTRANDOM, TOBRATROUGH, TOBRAPEAK, TOBRARND, AMIKACINPEAK, AMIKACINTROU, AMIKACIN,  in the last 72 hours   Microbiology: Recent Results (from the past 720 hour(s))  URINE CULTURE     Status: None   Collection Time    07/07/12 12:27 PM      Result Value Range Status   Specimen Description URINE, RANDOM   Final   Special Requests NONE    Final   Culture  Setup Time 07/07/2012 23:01   Final   Colony Count >=100,000 COLONIES/ML   Final   Culture ESCHERICHIA COLI   Final   Report Status 07/09/2012 FINAL   Final   Organism ID, Bacteria ESCHERICHIA COLI   Final  MRSA PCR SCREENING     Status: None   Collection Time    07/10/12  5:33 AM      Result Value Range Status   MRSA by PCR NEGATIVE  NEGATIVE Final   Comment:            The GeneXpert MRSA Assay (FDA     approved for NASAL specimens     only), is one component of a     comprehensive MRSA colonization     surveillance program. It is not     intended to diagnose MRSA     infection nor to guide or     monitor treatment for     MRSA infections.  CULTURE, BLOOD (ROUTINE X 2)     Status: None   Collection Time    07/10/12  6:40 AM      Result Value Range Status   Specimen Description BLOOD LEFT ARM   Final   Special Requests BOTTLES DRAWN AEROBIC AND ANAEROBIC 5 CC EACH   Final   Culture  Setup Time 07/10/2012  10:36   Final   Culture     Final   Value:        BLOOD CULTURE RECEIVED NO GROWTH TO DATE CULTURE WILL BE HELD FOR 5 DAYS BEFORE ISSUING A FINAL NEGATIVE REPORT   Report Status PENDING   Incomplete  CULTURE, BLOOD (ROUTINE X 2)     Status: None   Collection Time    07/10/12  6:43 AM      Result Value Range Status   Specimen Description BLOOD RIGHT HAND   Final   Special Requests BOTTLES DRAWN AEROBIC ONLY 2 CC   Final   Culture  Setup Time 07/10/2012 10:36   Final   Culture     Final   Value:        BLOOD CULTURE RECEIVED NO GROWTH TO DATE CULTURE WILL BE HELD FOR 5 DAYS BEFORE ISSUING A FINAL NEGATIVE REPORT   Report Status PENDING   Incomplete    Medical History: Past Medical History  Diagnosis Date  . Myalgia and myositis, unspecified   . Esophageal reflux   . Degeneration of intervertebral disc, site unspecified   . Morbid obesity   . Osteoarthrosis, unspecified whether generalized or localized, unspecified site   . Rheumatoid arthritis(714.0)    . Type II or unspecified type diabetes mellitus without mention of complication, not stated as uncontrolled   . CAD (coronary artery disease)   . Chronic kidney disease   . Thrombocytopenia     Medications:  Anti-infectives   Start     Dose/Rate Route Frequency Ordered Stop   07/12/12 0600  vancomycin (VANCOCIN) 1,500 mg in sodium chloride 0.9 % 500 mL IVPB  Status:  Discontinued     1,500 mg 250 mL/hr over 120 Minutes Intravenous Every 48 hours 07/10/12 0546 07/11/12 1126   07/11/12 1400  vancomycin (VANCOCIN) 1,500 mg in sodium chloride 0.9 % 500 mL IVPB     1,500 mg 250 mL/hr over 120 Minutes Intravenous Every 24 hours 07/11/12 1126     07/10/12 1100  fluconazole (DIFLUCAN) IVPB 100 mg     100 mg 50 mL/hr over 60 Minutes Intravenous Daily 07/10/12 0938     07/10/12 0600  aztreonam (AZACTAM) 2 g in dextrose 5 % 50 mL IVPB     2 g 100 mL/hr over 30 Minutes Intravenous 3 times per day 07/10/12 0524 07/18/12 0559   07/10/12 0600  vancomycin (VANCOCIN) 1,750 mg in sodium chloride 0.9 % 500 mL IVPB     1,750 mg 250 mL/hr over 120 Minutes Intravenous  Once 07/10/12 0545 07/10/12 0850   07/07/12 2200  hydroxychloroquine (PLAQUENIL) tablet 200 mg  Status:  Discontinued     200 mg Oral 2 times daily 07/07/12 1449 07/10/12 0938   07/07/12 1330  cefTRIAXone (ROCEPHIN) 1 g in dextrose 5 % 50 mL IVPB  Status:  Discontinued     1 g 100 mL/hr over 30 Minutes Intravenous Every 24 hours 07/07/12 1315 07/10/12 0524     Assessment: 80 YOF admitted 5/30 with acute renal insufficiency, abd pain with constipation. Antibiotics broadened 6/2 for possible aspiration pneumonia (pulmonary thinks this is unlikely), also covering UTI.   6/2 >> vanc >> 6/2 >> aztreo >>  6/2 >> diflucan(MD) >> 5/30 >> ceftriaxone >> 6/1  Tmax: 99.2 WBCs: 9.5 Renal: SCr = 1.38 (improving) CrCl 39ml/min, N 32ml/min, UOP low  6/2 blood: pending 5/30 urine: >100K e. Coli (R to amp only) 6/2 Sputum: ordered  Dose  changes/drug level info:  6/3: change vanco 1500mg  IV q48h to q24h  Goal of Therapy:  Vancomycin trough level 15-20 mcg/ml Aztreonam dosed based on patient weight and renal function  Plan:  Day #2 vancomycin/aztreonam  Change vancomycin to 1500mg  IV q24h for improved renal fx  Continue aztreonam 2gm IV q8h  Suggest adjust antibiotics if aspiration PNA now unlikely  Diflucan dose can be increased to 200mg  daily - depending on indication.   Holly [name], PharmD, BCPS.   Pager: [phone]  07/11/2012,11:27 AM

## 2012-07-11 NOTE — Consult Note (Signed)
PULMONARY  / CRITICAL CARE MEDICINE  Name: Holly [name] MRN: [id number] DOB: 11-24-1931    ADMISSION DATE:  07/07/2012 CONSULTATION DATE:  Holly [name]  REFERRING MD :  [facility] PRIMARY SERVICE: [facility]  CHIEF COMPLAINT:  AMS  BRIEF PATIENT DESCRIPTION:  79 yo MO(258 lbs) WF with known OSA and is Cpap dependent. S presented to [facility] 5-30 with ongoing abdominal pain and worsening renal failure. She is on coumadin for Afib and did develop brownish emesis 6-2 and was treated with phenergan for nausea/vomiting. She was moved to the ICU 6-2 for AMS following phenergan and was found to be hypoxic by ABG consistent with hypoventilation.  PCCM consulted to assess acute on chronic respiratory failure.   SIGNIFICANT EVENTS:  6-2 moved to ICU after AMS, post IV antivan and MSO4 6-3 much more awake  CULTURES: 5-30 uc >> ecoli ss roc 6-2 bc x 2>> 6-2 sputum>>  ANTIBIOTICS: 6-2 Azactam(per PCP)>> 6-2 Vanc(per PCP)>>  6-2 diflucan>>  SUBJECTIVE:  Throat sore from NG tube.  Passing flatus, and several small BM's.  Breathing better.  Denies chest congestion.  VITAL SIGNS: Temp:  [96.8 F (36 C)-99.2 F (37.3 C)] 98.2 F (36.8 C) (06/03 0410) Pulse Rate:  [84-108] 85 (06/03 0410) Resp:  [15-28] 21 (06/03 0410) BP: (82-146)/(33-99) 109/43 mmHg (06/03 0410) SpO2:  [93 %-100 %] 95 % (06/03 0444) FiO2 (%):  [35 %-50 %] 35 % (06/02 1426) Weight:  [115 kg (253 lb 8.5 oz)] 115 kg (253 lb 8.5 oz) (06/03 0500) 6 liters [place]  INTAKE / OUTPUT: Intake/Output     06/02 0701 - 06/03 0700 06/03 0701 - 06/04 0700   P.O.     I.V. (mL/kg) 480 (4.2)    Other 10    IV Piggyback 1200    Total Intake(mL/kg) 1690 (14.7)    Urine (mL/kg/hr) 675 (0.2)    Emesis/NG output 2150 (0.8)    Total Output 2825     Net -1135          Stool Occurrence 1 x      PHYSICAL EXAMINATION: General:  MO WF , awake and follows commands Neuro:  Intact currently, oriented to person/place, more interactive. HEENT:  No  LAN Cardiovascular:  HSIR Lungs:  Decreased bs bases, no wheeze Abdomen:  Obese, soft, mild tenderness Musculoskeletal:  Lower ext non walking from ra and failed joint replacement Skin:  Left heel scab noted, diffuse ecchymosis noted.  LABS:  Recent Labs Lab 07/07/12 1038  07/09/12 0510 07/09/12 2239 07/10/12 0430 07/10/12 0540 07/10/12 0845 07/10/12 0945 07/10/12 1025 07/10/12 1703 07/10/12 1704 07/11/12 0045  HGB 11.1*  < > 9.7*  --  10.4*  --  10.8*  --   --  10.6*  --  10.6*  WBC 7.0  < > 6.9  --  9.0  --   --   --   --   --   --  9.5  PLT 101*  < > 101*  --  110*  --   --   --   --   --   --  PLATELET CLUMPS NOTED ON SMEAR, COUNT APPEARS ADEQUATE  NA 137  < > 139  --  141  --   --   --   --   --  144 142  K 5.1  < > 4.4  --  4.4  --   --   --   --   --  4.1 4.8  CL 102  < >  106  --  105  --   --   --   --   --  108 107  CO2 24  < > 24  --  23  --   --   --   --   --  24 21  GLUCOSE 90  < > 91  --  112*  --   --   --   --   --  74 87  BUN 56*  < > 49*  --  45*  --   --   --   --   --  42* 40*  CREATININE 2.45*  < > 2.06*  --  1.84*  --   --   --   --   --  1.60* 1.38*  CALCIUM 9.1  < > 8.5  --  8.9  --   --   --   --   --  8.7 8.9  AST  --   --   --   --   --   --   --   --   --   --   --  63*  ALT  --   --   --   --   --   --   --   --   --   --   --  21  ALKPHOS  --   --   --   --   --   --   --   --   --   --   --  71  BILITOT  --   --   --   --   --   --   --   --   --   --   --  0.6  PROT  --   --   --   --   --   --   --   --   --   --   --  6.5  ALBUMIN  --   --   --   --   --   --   --   --   --   --   --  2.9*  INR 2.80*  < > 3.10*  --  2.56*  --   --   --   --   --   --  1.99*  LATICACIDVEN  --   --   --   --   --   --   --  1.0  --   --   --   --   TROPONINI  --   --   --   --   --   --   --   --  <0.30  --   --   --   PROBNP 21555.0*  --   --   --   --   --   --   --  28016.0*  --   --   --   PHART  --   --   --  7.461*  --  7.346*  --   --   --   --   --    --   PCO2ART  --   --   --  26.8*  --  39.2  --   --   --   --   --   --   PO2ART  --   --   --  65.9*  --  51.7*  --   --   --   --   --   --   < > =  values in this interval not displayed.  Recent Labs Lab 07/10/12 1136 07/10/12 1629 07/10/12 2036 07/11/12 0011 07/11/12 0317  GLUCAP 88 78 67* 88 85    CXR:  Ct Abdomen Pelvis Wo Contrast  07/10/2012   *RADIOLOGY REPORT*  Clinical Data: Abdominal pain.  CT ABDOMEN AND PELVIS WITHOUT CONTRAST  Technique:  Multidetector CT imaging of the abdomen and pelvis was performed following the standard protocol without intravenous contrast.  Comparison: None.  Findings: The lung bases demonstrate small bilateral pleural effusions and bibasilar atelectasis.  The heart is enlarged and there are extensive dense coronary artery calcifications.  There is an NG tube coursing down the esophagus and into the stomach.  The tip is in the second portion of the duodenum.  The liver contour is irregular suggesting cirrhosis.  There is extensive abdominal and pelvic ascites along with diffuse anasarca. No focal hepatic lesions or intrahepatic biliary dilatation.  No common bile duct dilatation.  The gallbladder appears normal.  The spleen is normal in size.  The pancreas is grossly normal.  Both adrenal glands are slightly enlarged but no discrete mass.  The kidneys are grossly normal.  No renal calculi or hydronephrosis.  The stomach, duodenum, small bowel and colon are grossly normal without oral contrast.  No findings for obstruction and/or perforation.  Extensive vascular calcifications without definite aneurysm.  No mesenteric or retroperitoneal mass or adenopathy.  There is a Foley catheter in the bladder.  The uterus and ovaries are grossly normal.  Large volume pelvic ascites.  No mass or adenopathy.  There are enlarged inguinal lymph nodes bilaterally.  The bony structures are grossly intact.  Advanced osteoporosis and degenerative changes.  IMPRESSION:  1.  Large  volume abdominal/pelvic ascites and diffuse anasarca. 2.  Irregular contour of the liver suggesting cirrhosis. 3.  Advanced atherosclerotic calcifications involving the major vascular structures. 4.  The NG tube tip is in the second portion of the duodenum.   Original Report Authenticated By: Rudie Meyer, M.D.   Dg Abd 1 View  07/10/2012   *RADIOLOGY REPORT*  Clinical Data: Refractory nausea and vomiting.  ABDOMEN - 1 VIEW  Comparison: Abdominal radiograph performed 07/07/2012  Findings: The visualized bowel gas pattern is nonspecific.  There is moderate distension of the stomach with air.  The colon is partially filled with stool and air and is grossly unremarkable in appearance.  No abnormal dilatation of small bowel loops is seen to suggest small bowel obstruction.  No free intra-abdominal air is identified, though evaluation for free air is limited on supine views.  Degenerative change is noted at the lower lumbar spine; the sacroiliac joints are unremarkable in appearance.  The lung bases are not well characterized due to motion artifact.  IMPRESSION: Nonspecific bowel gas pattern.  Moderate distension of the stomach with air.  This could reflect gastroparesis.  Air noted within small and large bowel loops, suggesting against gastric outlet obstruction.  No free intra-abdominal air seen.   Original Report Authenticated By: Tonia Ghent, M.D.   Dg Chest Port 1 View  07/11/2012   *RADIOLOGY REPORT*  Clinical Data: Shortness of breath, congestive heart failure  PORTABLE CHEST - 1 VIEW  Comparison: Portable chest x-ray of 07/10/2012  Findings: Aeration has improved slightly.  There is still evidence of pulmonary vascular congestion with probable bilateral pleural effusions.  Cardiomegaly is stable.  An NG tube tip seen only to the lower esophagus, not visualized below the hemidiaphragm on the current film.  IMPRESSION: Slightly better  aeration.  Persistent mild CHF.   Original Report Authenticated By: Dwyane Dee, M.D.   Dg Chest Port 1 View  07/10/2012   *RADIOLOGY REPORT*  Clinical Data: Respiratory distress.  PORTABLE CHEST - 1 VIEW  Comparison: Chest radiograph performed 07/09/2012  Findings: The lungs are hypoexpanded.  Vascular congestion is seen. Bilateral airspace opacification is noted, left greater than right, similar appearance to the prior study.  This likely reflects asymmetric interstitial edema, though pneumonia might have a similar appearance.  A small left pleural effusion is suspected. No pneumothorax is seen.  The cardiomediastinal silhouette is mildly enlarged.  No acute osseous abnormalities are identified.  The patient's enteric tube is noted extending below the diaphragm.  IMPRESSION:  1.  Lungs hypoexpanded.  Bilateral airspace opacification, left greater than right, similar in appearance to the prior study.  This likely reflects asymmetric interstitial edema, though pneumonia might have a similar appearance.  Suspect small left pleural effusion. 2.  Vascular congestion and mild cardiomegaly noted.   Original Report Authenticated By: Tonia Ghent, M.D.   Dg Chest Port 1 View  07/10/2012   *RADIOLOGY REPORT*  Clinical Data: Respiratory distress  PORTABLE CHEST - 1 VIEW  Comparison: 07/07/2012  Findings: The patient is rotated to the right.  Lungs are markedly hypo aerated with crowding of the bronchovascular markings. Prominence of the hila bilaterally likely reflects vascular congestion in the setting of cardiomegaly. Aorta is ectatic and unfolded.  No pleural effusion.  IMPRESSION: Cardiomegaly with central vascular congestion and hypoaeration. If the patient's symptoms continue, consider PA and lateral chest radiographs obtained at full inspiration when the patient is clinically able.   Original Report Authenticated By: Christiana Pellant, M.D.   Dg Abd 2 Views  07/11/2012   *RADIOLOGY REPORT*  Clinical Data: Small bowel obstruction.  Fecal impaction.  ABDOMEN - 2 VIEW  Comparison:  Radiographs and CT scan dated 07/10/2012  Findings: NG tube has been inserted and the stomach is now decompressed.  There is no free air in the abdomen.  There are no dilated loops of bowel. No appreciable stool in the rectum.  No acute osseous abnormality.  IMPRESSION:  1.  No fecal impaction. 2.  No small bowel obstruction. 3.  Stomach has been decompressed by the NG tube.   Original Report Authenticated By: Francene Boyers, M.D.     ASSESSMENT / PLAN:  PULMONARY A:Acute on chronic hypoxic resp failure in the setting of MO, OSA, AMS from sedation, lack of mandatory Cpap.  ILD followed by Dr. Shelle Iron of pulmonary.  Much improved 6/03. P:   O2 as needed Cpap nocturnal and PRN  CARDIOVASCULAR A: Afib chronic, CAD, CHF P:  Per primary team  RENAL A:  Acute on chronic renal failure. P:   Per primary team  GASTROINTESTINAL A:  Nausea/Vomiting/Suspected GIB/constipation/gastroparesis. Ileus. Cirrhosis. P:   Per primary team and GI.  HEMATOLOGIC A:  Chronic anticoagulation for AFIB P:  holding coumadin with suspected GIB  INFECTIOUS A:  Ecoli UTI, thrush. Initial concern for aspiration >> less likely. P:   Abx per primary team  ENDOCRINE A: DM 2  , RA P:   SSI Plaquenil but may need steroids  NEUROLOGIC A:  Decreased LOC >> much improved 6/03. P:   Limit narcotics/sedatives.  Brett Canales Minor ACNP [facility] PCCM Pager [phone] till 3 pm If no answer page [phone] 07/11/2012, 8:46 AM  Reviewed above, examined pt, and agree with assessment/plan.  Respiratory status improved.  Clinical concern for aspiration pneumonia  is less likely now >> can likely narrow Abx to cover only for UTI >> defer to primary team.  GI assessing cirrhosis.    PCCM will sign off.  Please call if further help needed.  Coralyn Helling, MD [facility] Pulmonary/Critical Care 07/11/2012, 9:19 AM Pager:  [phone] After 3pm call: [phone]

## 2012-07-11 NOTE — Progress Notes (Signed)
[facility] PROGRESS NOTE  NOLLIE SHIFLETT JYN:[id number] DOB: [birth year] DOA: 07/07/2012 PCP: [name], MD  Holly [name] is a 79 y.o. female with PMH significant for RA, Diabetes, Diastolic Heart Failure, CAD, A fib on coumadin, CKD who was refer by PCP to the ED for evaluation of worsening renal function. Patient saw her PCP day prior of admission because she was having problem with constipation and decrease urine out put. Patient last BM was 5 days prior to admission. She was doing well with enemas and having stool output but overnight 6/1 she developed N/V and was given phenergan which caused her to have respiratory depression and she was transferred to SDU   Assessment/Plan: Nausea with vomiting-prob gastritis: NG tube place with large output, no BM before admission for 4-5 days, BM with enema in hospital, CT scan with ascities -consult GI- appreciate their input -hold coumadin -enema -lactic acid ok -Hgb stable  Impaction/constipation -enemas   acute resp failure -possible aspiration PNA -add broad spectrum abx -aprreciate critical care following patient -try IV dose lasix  Acute on chronic Renal Failure:  Probably pre renal (ATN) in setting of poor oral intake, diuretic, ACE and UTI.  IV fluids. Strict I and O. Renal U/s ok.  hold ACE, UOP good, baseline CR  About 1.6  UTI: UA with positive nitrates, WBC 21 to 50. Patient with problem with urination.  IV ceftriaxone. Follow urine culture- ecoli.   Diabetes: Will order SSI, HB-A1c of 6   A fib: Continue with metoprolol with holding parameters for hypotension. Hold coumadin  Morbid Obesity  RA: Continue with Plaquenil.   Chronic Diastolic Heart Failure: Monitor for pulmonary edema. Holding lasix due to acute renal failure   thrush- nystatin swish and swallow changed to diflucan  Thrombocytopenia -monitor with CBC   Code Status: full Family Communication: patient at bedside Disposition Plan:     Consultants:  GI  Critical care  Procedures:    Antibiotics:  rocephin  HPI/Subjective: NG tube in place after No abd pain but dry lips Asking for ice chips   Objective: Filed Vitals:   07/11/12 0200 07/11/12 0410 07/11/12 0444 07/11/12 0500  BP: 112/88 109/43    Pulse: 108 85    Temp:  98.2 F (36.8 C)    TempSrc:  Oral    Resp: 20 21    Height:      Weight:    115 kg (253 lb 8.5 oz)  SpO2: 96% 96% 95%     Intake/Output Summary (Last 24 hours) at 07/11/12 0800 Last data filed at 07/11/12 0556  Gross per 24 hour  Intake   1180 ml  Output   2725 ml  Net  -1545 ml   Filed Weights   07/09/12 0519 07/10/12 0500 07/11/12 0500  Weight: 113.3 kg (249 lb 12.5 oz) 117.3 kg (258 lb 9.6 oz) 115 kg (253 lb 8.5 oz)    Exam:   General:  Less uncomfortable today, answers questions approriately  Cardiovascular: irregular  Respiratory: no wheezing, decreased at baseline  Abdomen: +BS decreased, obese, non tender- NG tube in place  Musculoskeletal: weakness b/L all ext- chronic changes  Data Reviewed: Basic Metabolic Panel:  Recent Labs Lab 07/08/12 0522 07/09/12 0510 07/10/12 0430 07/10/12 1704 07/11/12 0045  NA 138 139 141 144 142  K 4.7 4.4 4.4 4.1 4.8  CL 103 106 105 108 107  CO2 26 24 23 24 21   GLUCOSE 83 91 112* 74 87  BUN 53* 49*  45* 42* 40*  CREATININE 2.22* 2.06* 1.84* 1.60* 1.38*  CALCIUM 8.6 8.5 8.9 8.7 8.9   Liver Function Tests:  Recent Labs Lab 07/11/12 0045  AST 63*  ALT 21  ALKPHOS 71  BILITOT 0.6  PROT 6.5  ALBUMIN 2.9*   No results found for this basename: LIPASE, AMYLASE,  in the last 168 hours No results found for this basename: AMMONIA,  in the last 168 hours CBC:  Recent Labs Lab 07/07/12 1038 07/08/12 0522 07/09/12 0510 07/10/12 0430 07/10/12 0845 07/10/12 1703 07/11/12 0045  WBC 7.0 6.2 6.9 9.0  --   --  9.5  NEUTROABS 5.5  --   --   --   --   --   --   HGB 11.1* 9.8* 9.7* 10.4* 10.8* 10.6* 10.6*   HCT 34.8* 32.6* 31.9* 33.8* 33.4* 32.9* 34.0*  MCV 85.5 86.0 86.0 86.0  --   --  86.3  PLT 101* 100* 101* 110*  --   --  PLATELET CLUMPS NOTED ON SMEAR, COUNT APPEARS ADEQUATE   Cardiac Enzymes:  Recent Labs Lab 07/10/12 1025  TROPONINI <0.30   BNP (last 3 results)  Recent Labs  07/07/12 1038 07/10/12 1025  PROBNP 21555.0* 28016.0*   CBG:  Recent Labs Lab 07/09/12 2229 07/10/12 0756 07/10/12 1136 07/10/12 1629 07/11/12 0011  GLUCAP 125* 97 88 78 88    Recent Results (from the past 240 hour(s))  URINE CULTURE     Status: None   Collection Time    07/07/12 12:27 PM      Result Value Range Status   Specimen Description URINE, RANDOM   Final   Special Requests NONE   Final   Culture  Setup Time 07/07/2012 23:01   Final   Colony Count >=100,000 COLONIES/ML   Final   Culture ESCHERICHIA COLI   Final   Report Status 07/09/2012 FINAL   Final   Organism ID, Bacteria ESCHERICHIA COLI   Final  MRSA PCR SCREENING     Status: None   Collection Time    07/10/12  5:33 AM      Result Value Range Status   MRSA by PCR NEGATIVE  NEGATIVE Final   Comment:            The GeneXpert MRSA Assay (FDA     approved for NASAL specimens     only), is one component of a     comprehensive MRSA colonization     surveillance program. It is not     intended to diagnose MRSA     infection nor to guide or     monitor treatment for     MRSA infections.     Studies: Ct Abdomen Pelvis Wo Contrast  07/10/2012   *RADIOLOGY REPORT*  Clinical Data: Abdominal pain.  CT ABDOMEN AND PELVIS WITHOUT CONTRAST  Technique:  Multidetector CT imaging of the abdomen and pelvis was performed following the standard protocol without intravenous contrast.  Comparison: None.  Findings: The lung bases demonstrate small bilateral pleural effusions and bibasilar atelectasis.  The heart is enlarged and there are extensive dense coronary artery calcifications.  There is an NG tube coursing down the esophagus and into  the stomach.  The tip is in the second portion of the duodenum.  The liver contour is irregular suggesting cirrhosis.  There is extensive abdominal and pelvic ascites along with diffuse anasarca. No focal hepatic lesions or intrahepatic biliary dilatation.  No common bile duct dilatation.  The gallbladder appears  normal.  The spleen is normal in size.  The pancreas is grossly normal.  Both adrenal glands are slightly enlarged but no discrete mass.  The kidneys are grossly normal.  No renal calculi or hydronephrosis.  The stomach, duodenum, small bowel and colon are grossly normal without oral contrast.  No findings for obstruction and/or perforation.  Extensive vascular calcifications without definite aneurysm.  No mesenteric or retroperitoneal mass or adenopathy.  There is a Foley catheter in the bladder.  The uterus and ovaries are grossly normal.  Large volume pelvic ascites.  No mass or adenopathy.  There are enlarged inguinal lymph nodes bilaterally.  The bony structures are grossly intact.  Advanced osteoporosis and degenerative changes.  IMPRESSION:  1.  Large volume abdominal/pelvic ascites and diffuse anasarca. 2.  Irregular contour of the liver suggesting cirrhosis. 3.  Advanced atherosclerotic calcifications involving the major vascular structures. 4.  The NG tube tip is in the second portion of the duodenum.   Original Report Authenticated By: Rudie Meyer, M.D.   Dg Abd 1 View  07/10/2012   *RADIOLOGY REPORT*  Clinical Data: Refractory nausea and vomiting.  ABDOMEN - 1 VIEW  Comparison: Abdominal radiograph performed 07/07/2012  Findings: The visualized bowel gas pattern is nonspecific.  There is moderate distension of the stomach with air.  The colon is partially filled with stool and air and is grossly unremarkable in appearance.  No abnormal dilatation of small bowel loops is seen to suggest small bowel obstruction.  No free intra-abdominal air is identified, though evaluation for free air is  limited on supine views.  Degenerative change is noted at the lower lumbar spine; the sacroiliac joints are unremarkable in appearance.  The lung bases are not well characterized due to motion artifact.  IMPRESSION: Nonspecific bowel gas pattern.  Moderate distension of the stomach with air.  This could reflect gastroparesis.  Air noted within small and large bowel loops, suggesting against gastric outlet obstruction.  No free intra-abdominal air seen.   Original Report Authenticated By: Tonia Ghent, M.D.   Dg Chest Port 1 View  07/10/2012   *RADIOLOGY REPORT*  Clinical Data: Respiratory distress.  PORTABLE CHEST - 1 VIEW  Comparison: Chest radiograph performed 07/09/2012  Findings: The lungs are hypoexpanded.  Vascular congestion is seen. Bilateral airspace opacification is noted, left greater than right, similar appearance to the prior study.  This likely reflects asymmetric interstitial edema, though pneumonia might have a similar appearance.  A small left pleural effusion is suspected. No pneumothorax is seen.  The cardiomediastinal silhouette is mildly enlarged.  No acute osseous abnormalities are identified.  The patient's enteric tube is noted extending below the diaphragm.  IMPRESSION:  1.  Lungs hypoexpanded.  Bilateral airspace opacification, left greater than right, similar in appearance to the prior study.  This likely reflects asymmetric interstitial edema, though pneumonia might have a similar appearance.  Suspect small left pleural effusion. 2.  Vascular congestion and mild cardiomegaly noted.   Original Report Authenticated By: Tonia Ghent, M.D.   Dg Chest Port 1 View  07/10/2012   *RADIOLOGY REPORT*  Clinical Data: Respiratory distress  PORTABLE CHEST - 1 VIEW  Comparison: 07/07/2012  Findings: The patient is rotated to the right.  Lungs are markedly hypo aerated with crowding of the bronchovascular markings. Prominence of the hila bilaterally likely reflects vascular congestion in the  setting of cardiomegaly. Aorta is ectatic and unfolded.  No pleural effusion.  IMPRESSION: Cardiomegaly with central vascular congestion and hypoaeration. If the patient's  symptoms continue, consider PA and lateral chest radiographs obtained at full inspiration when the patient is clinically able.   Original Report Authenticated By: Christiana Pellant, M.D.    Scheduled Meds: . aztreonam  2 g Intravenous Q8H  . fluconazole (DIFLUCAN) IV  100 mg Intravenous Daily  . insulin aspart  0-9 Units Subcutaneous Q4H  . levalbuterol  0.63 mg Nebulization Q8H  . mineral oil  1 enema Rectal Once  . pantoprazole (PROTONIX) IV  40 mg Intravenous Q12H  . simvastatin  20 mg Oral QHS  . sodium chloride  3 mL Intravenous Q12H  . sorbitol, milk of mag, mineral oil, glycerin (SMOG) enema  960 mL Rectal Once  . [START ON 07/12/2012] vancomycin  1,500 mg Intravenous Q48H   Continuous Infusions: . sodium chloride 10 mL/hr at 07/10/12 1900  . dextrose 5 % and 0.45% NaCl 50 mL/hr (07/10/12 2117)    Active Problems:   DIABETES MELLITUS, TYPE II   Morbid obesity   CORONARY ARTERY DISEASE   INTERSTITIAL LUNG DISEASE   Acute on chronic renal failure   Unspecified constipation    Time spent: 35    [facility], Jeannie Mallinger  [facility] Pager [phone]. If 7PM-7AM, please contact night-coverage at [email or web address], password [facility] 07/11/2012, 8:00 AM  LOS: 4 days

## 2012-07-12 ENCOUNTER — Inpatient Hospital Stay (HOSPITAL_COMMUNITY): Payer: Medicare Other

## 2012-07-12 LAB — PROTIME-INR
INR: 3.48 — ABNORMAL HIGH (ref 0.00–1.49)
Prothrombin Time: 33 seconds — ABNORMAL HIGH (ref 11.6–15.2)

## 2012-07-12 LAB — GLUCOSE, CAPILLARY
Glucose-Capillary: 104 mg/dL — ABNORMAL HIGH (ref 70–99)
Glucose-Capillary: 105 mg/dL — ABNORMAL HIGH (ref 70–99)
Glucose-Capillary: 117 mg/dL — ABNORMAL HIGH (ref 70–99)
Glucose-Capillary: 91 mg/dL (ref 70–99)
Glucose-Capillary: 111 mg/dL — ABNORMAL HIGH (ref 70–99)

## 2012-07-12 LAB — HEMOGLOBIN AND HEMATOCRIT, BLOOD
HCT: 31 % — ABNORMAL LOW (ref 36.0–46.0)
HCT: 33.2 % — ABNORMAL LOW (ref 36.0–46.0)

## 2012-07-12 LAB — BASIC METABOLIC PANEL
CO2: 25 mEq/L (ref 19–32)
Calcium: 8.7 mg/dL (ref 8.4–10.5)
Chloride: 106 mEq/L (ref 96–112)
Creatinine, Ser: 1.26 mg/dL — ABNORMAL HIGH (ref 0.50–1.10)
GFR calc Af Amer: 46 mL/min — ABNORMAL LOW (ref >90)
Sodium: 139 mEq/L (ref 135–145)

## 2012-07-12 LAB — LEGIONELLA ANTIGEN, URINE

## 2012-07-12 MED ORDER — PANTOPRAZOLE SODIUM 40 MG PO TBEC
40.0000 mg | DELAYED_RELEASE_TABLET | Freq: Two times a day (BID) | ORAL | Status: DC
  Administered 2012-07-12 – 2012-07-17 (×11): 40 mg via ORAL
  Filled 2012-07-12 (×12): qty 1

## 2012-07-12 MED ORDER — ROPINIROLE HCL 0.5 MG PO TABS
0.5000 mg | ORAL_TABLET | Freq: Two times a day (BID) | ORAL | Status: DC
  Administered 2012-07-12 – 2012-07-17 (×10): 0.5 mg via ORAL
  Filled 2012-07-12 (×11): qty 1

## 2012-07-12 MED ORDER — CHLORHEXIDINE GLUCONATE 0.12 % MT SOLN
15.0000 mL | Freq: Two times a day (BID) | OROMUCOSAL | Status: DC
  Administered 2012-07-12 – 2012-07-17 (×11): 15 mL via OROMUCOSAL
  Filled 2012-07-12 (×13): qty 15

## 2012-07-12 MED ORDER — FUROSEMIDE 10 MG/ML IJ SOLN
40.0000 mg | Freq: Every day | INTRAMUSCULAR | Status: DC
  Administered 2012-07-12 – 2012-07-15 (×4): 40 mg via INTRAVENOUS
  Filled 2012-07-12 (×4): qty 4

## 2012-07-12 MED ORDER — BIOTENE DRY MOUTH MT LIQD
15.0000 mL | Freq: Two times a day (BID) | OROMUCOSAL | Status: DC
  Administered 2012-07-13 – 2012-07-17 (×9): 15 mL via OROMUCOSAL

## 2012-07-12 NOTE — Progress Notes (Signed)
[facility] Progress Note   Subjective  Sleepy. Tolerating clears. No further nausea.   Objective   Vital signs in last 24 hours: Temp:  [97.8 F (36.6 C)-99.5 F (37.5 C)] 98.1 F (36.7 C) (06/04 0800) Pulse Rate:  [33-97] 94 (06/04 0750) Resp:  [17-34] 20 (06/04 0750) BP: (98-126)/(54-86) 125/64 mmHg (06/04 0750) SpO2:  [88 %-100 %] 95 % (06/04 0750) Weight:  [260 lb 12.9 oz (118.3 kg)] 260 lb 12.9 oz (118.3 kg) (06/04 0500) Last BM Date: 07/11/12 General:    Pleasant obese white female in NAD. Wearing bipap. Abdomen:  Soft, obese, subcutaneous edema.  Extremities:  Generalized brusing on all extremities. Pitting edema bilateral upper and lower extremities.  Neurologic:  groggy,  grossly normal neurologically. No asterixis  Lab Results:  Recent Labs  07/10/12 0430  07/11/12 0045  07/11/12 1714 07/12/12 0100 07/12/12 0825  WBC 9.0  --  9.5  --   --   --   --   HGB 10.4*  < > 10.6*  < > 10.9* 9.6* 9.6*  HCT 33.8*  < > 34.0*  < > 35.1* 30.9* 31.0*  PLT 110*  --  PLATELET CLUMPS NOTED ON SMEAR, COUNT APPEARS ADEQUATE  --   --   --   --   < > = values in this interval not displayed. BMET  Recent Labs  07/10/12 1704 07/11/12 0045 07/12/12 0100  NA 144 142 139  K 4.1 4.8 3.8  CL 108 107 106  CO2 24 21 25   GLUCOSE 74 87 94  BUN 42* 40* 34*  CREATININE 1.60* 1.38* 1.26*  CALCIUM 8.7 8.9 8.7   LFT  Recent Labs  07/11/12 0045  PROT 6.5  ALBUMIN 2.9*  AST 63*  ALT 21  ALKPHOS 71  BILITOT 0.6   PT/INR  Recent Labs  07/11/12 0045 07/12/12 0100  LABPROT 21.8* 33.0*  INR 1.99* 3.48*    Studies/Results: Dg Chest Port 1 View  07/12/2012   *RADIOLOGY REPORT*  Clinical Data: Pulmonary edema  PORTABLE CHEST - 1 VIEW  Comparison: Prior chest x-ray 07/11/2012  Findings: Slightly decreased interstitial edema.  Persistent cardiomegaly with pulmonary vascular congestion.  The main central pulmonary arteries are enlarged.  Inspiratory volumes are low and  there is bibasilar atelectasis.  IMPRESSION:  1.  Slightly improved interstitial edema 2.  Pulmonary arterial enlargements suggest underlying pulmonary arterial hypertension 3.  Stable cardiomegaly and pulmonary vascular congestion   Original Report Authenticated By: Malachy Moan, M.D.     Assessment / Plan:   1. Chronic constipation / diarrhea. Constipated on admission, resolved with enemas.   2. Acute on chronic nausea. Tolerated NGT clamping. NGT pulled, tolerating clears. No further hematemesis. Will change IV PPI to PO. Advance diet to solids.    3.  Cirrhosis, probably NASH, cardiac cirrhosis not excluded. No focal liver lesions on non-contrast CTscan. HBV, HCV negative. Large volume ascites / anasarca on scan which may be combination of cirrhosis and CHF.   Will consider diagnostic paracentesis when acute issues and INR improve. We can possibly do a large volume paracentesis with fluid studies if medical issues stable. Renal function improving, she got a dose of IV lasix today.  Her INR is rising despite no coumadin in a few days. This could be from decompensation. Sleepier than normal today, watch for encephalopathy. No asterixis on exam   4. Afib, coumadin on hold for a few days but.rising (3.48 today). INR may be rising partly from antibiotics and /  or liver disease.   5. Acute on chronic respiratory failure. On bipap this am  5. Multiple, significant medical issues..       LOS: 5 days   Willette Cluster  07/12/2012, 11:46 AM  I have personally taken an interval history, reviewed the chart, and examined the patient.  I agree with the extender's note, impression and recommendations.  Barbette Hair. Arlyce Dice, MD, [facility] [phone]

## 2012-07-12 NOTE — Progress Notes (Signed)
RT placed PT on Auto BiPAP while sleeping in bedside chair- 6 lpm 02 bleed in proximal to PT- HR 84, Sp02 92%, RR 17. RN aware PT is on PAP at this time.

## 2012-07-12 NOTE — Progress Notes (Signed)
[facility] PROGRESS NOTE  Holly [name] WGN:[id number] DOB: Aug 18, 1931 DOA: 07/07/2012 PCP: [name], MD  Assessment/Plan: Active Problems:   DIABETES MELLITUS, TYPE II   Morbid obesity   CORONARY ARTERY DISEASE   INTERSTITIAL LUNG DISEASE   Acute on chronic renal failure   Unspecified constipation   Cirrhosis of liver without mention of alcohol   Ascites    1. Nausea/Vomiting: Patient has a known history of chronic nausea with occasional vomiting. On 07/09/12, she developed acute nausea/vomiting, associated with coffee grounds, in the setting of Coumadin therapy. Etiology is suspected to be gastritis vs esophagitis. Managed with bowel rest, NG-T, antiemetics and PPI. Dr Lina Sar provided GI consultation. KUB revealed moderate gastric distention, and abdominal /pelvic CT scan showed no evidence of of obstruction. Initial NG-T output was quite large, but as of 07/11/12, on GI recommendations, we were able to clamp, then discontinue NG-T, without deleterious effect. Patient is currently tolerating clears. She may have underlying gastroparesis.  2. Fecal Impaction/constipation:  Patient has been troubled by constipation, sometimes alternating with loose stools, and takes anti-diarrheals as needed. Suspect multifactorial (immobility, medications, maybe IBS), with superimposed acute constipation. KUB /CTscan negative for obstructive process. Managed with enemas' with satisfactory response.  3. Hepatic Cirrhosis/Ascites: CT scan demonstrated large volume abdominal/pelvic ascites and diffuse anasarca. Irregular contour of the liver suggesting cirrhosis. Differential-diagnostic considerations include NASH, vs cardiac cirrhosis. Hepatitis B & C serologies are negative. 2D Echocardiogram was done on 07/11/12. Report is pending. Per GI, to consider diagnostic paracentesis when renal function and INR improve. Meanwhile, low sodium diet.  4. Acute respiratory failure: Patient developed  acute hypoxic respiratory failure on 07/10/12, following administration of Phenergan, for emesis. Dr Coralyn Helling provided pulmonary consultation, and has opined that this was multi-factorial, acute on chronic hypoxic resp failure in the setting of MO, OSA, AMS from sedation, lack of mandatory Cpap. Possible aspiration, is also a consideration. Patient apparently, also has ILD, under care of Dr Marcelyn Bruins, pulmonologist. Managed as recommended, with satisfactory clinical improvement. Currently saturating at 95% on 6L. CXR indicates mild CHF.  5. Chronic Diastolic Heart Failure: Patient has a known history of diastolic CHF. This may have been contributory to acute decompensation of respiratory status. See #4 above. Lasix currently on hold, due to ARF. Will likely recommence today.  6. Acute on chronic Renal Failure: At presentation, creatinine was 2.45. Probably pre renal (ATN) in setting of poor oral intake, diuretic, ACE and UTI. Managed with IV fluids, ACE-i placed on hold (baseline CR About 1.6). Urine output has been good, and creatinine has trended down. Now 1.38 as of 07/11/12.   7. UTI: UA with positive nitrates, WBC 21 to 50. Urine culture grew pan-sensitive E. Coli. Managing with iv Rocephin.  8. Diabetes: This is type-2 and CBGs have remained satisfactory on SSI. HBA1C is 6.  9. A fib: Rate-controlled on Metoprolol. Coumadin is currently on hold.  10. Morbid Obesity/OSA: On nocturnal CPAP.  11. RA: Continued on Plaquenil. Not problematic. 12. Thrush; Patient  Was found to have oral thrush incidentally, on physical examination. Initially managed with Nystatin swish. Changed to Diflucan on 07/10/12.  13. Thrombocytopenia: Chronic, stable. Following CBC.    Code Status: Full Code.  Family Communication:  Disposition Plan: To be determined.    Brief narrative: 79 yo female, with known RA, Morbid obesity, DM, Diastolic Heart Failure, CAD, A fib on coumadin, CKD, OSA, Cpap dependent.who was referred  by PCP to the ED for evaluation of worsening  renal function. Patient saw her PCP day prior to admission, because she was having problems with constipation and decreased urine output. She did develop brownish emesis 07/10/12 and was treated with Phenergan for nausea/vomiting. She was moved to the ICU afterwards, for AMS following Phenergan and was found to be hypoxic by ABG, consistent with hypoventilation. PCCM was consulted to assess acute on chronic respiratory failure.   Consultants:  Dr Coralyn Helling, PCCM.  Dr Lina Sar, GI.  Procedures:  See Notes.   Antibiotics:  Rocephin 07/07/12>>>  Vancomycin 07/09/12>>>  Diflucan 07/10/12>>>  HPI/Subjective: Feels better. Managing clears. Small bowel movement yesterday.   Objective: Vital signs in last 24 hours: Temp:  [97.8 F (36.6 C)-99.5 F (37.5 C)] 98.2 F (36.8 C) (06/04 0500) Pulse Rate:  [30-97] 93 (06/04 0455) Resp:  [17-34] 23 (06/04 0455) BP: (98-126)/(54-86) 119/71 mmHg (06/04 0455) SpO2:  [88 %-100 %] 95 % (06/04 0455) Weight:  [118.3 kg (260 lb 12.9 oz)] 118.3 kg (260 lb 12.9 oz) (06/04 0500) Weight change: 3.3 kg (7 lb 4.4 oz) Last BM Date: 07/11/12  Intake/Output from previous day: 06/03 0701 - 06/04 0700 In: 1805 [P.O.:480; I.V.:1225; IV Piggyback:100] Out: 675 [Urine:625; Emesis/NG output:50]     Physical Exam: General: Comfortable, alert, communicative, fully oriented, not short of breath at rest.  HEENT:  Mild clinical pallor, no jaundice, no conjunctival injection or discharge. NECK:  Supple, JVP not seen, no carotid bruits, no palpable lymphadenopathy, no palpable goiter. CHEST:  Clinically clear to auscultation, no wheezes, no crackles. HEART:  Sounds 1 and 2 heard, normal, irregular, no murmurs. ABDOMEN:  Morbidly obese, soft, non-tender, no palpable organomegaly, no palpable masses, normal bowel sounds. GENITALIA:  Not examined. LOWER EXTREMITIES:  Moderate pitting edema, palpable peripheral  pulses. MUSCULOSKELETAL SYSTEM:  Generalized osteoarthritic changes, otherwise, normal. CENTRAL NERVOUS SYSTEM:  No focal neurologic deficit on gross examination.  Lab Results:  Recent Labs  07/10/12 0430  07/11/12 0045  07/11/12 1714 07/12/12 0100  WBC 9.0  --  9.5  --   --   --   HGB 10.4*  < > 10.6*  < > 10.9* 9.6*  HCT 33.8*  < > 34.0*  < > 35.1* 30.9*  PLT 110*  --  PLATELET CLUMPS NOTED ON SMEAR, COUNT APPEARS ADEQUATE  --   --   --   < > = values in this interval not displayed.  Recent Labs  07/10/12 1704 07/11/12 0045  NA 144 142  K 4.1 4.8  CL 108 107  CO2 24 21  GLUCOSE 74 87  BUN 42* 40*  CREATININE 1.60* 1.38*  CALCIUM 8.7 8.9   Recent Results (from the past 240 hour(s))  URINE CULTURE     Status: None   Collection Time    07/07/12 12:27 PM      Result Value Range Status   Specimen Description URINE, RANDOM   Final   Special Requests NONE   Final   Culture  Setup Time 07/07/2012 23:01   Final   Colony Count >=100,000 COLONIES/ML   Final   Culture ESCHERICHIA COLI   Final   Report Status 07/09/2012 FINAL   Final   Organism ID, Bacteria ESCHERICHIA COLI   Final  MRSA PCR SCREENING     Status: None   Collection Time    07/10/12  5:33 AM      Result Value Range Status   MRSA by PCR NEGATIVE  NEGATIVE Final   Comment:  The GeneXpert MRSA Assay (FDA     approved for NASAL specimens     only), is one component of a     comprehensive MRSA colonization     surveillance program. It is not     intended to diagnose MRSA     infection nor to guide or     monitor treatment for     MRSA infections.  CULTURE, BLOOD (ROUTINE X 2)     Status: None   Collection Time    07/10/12  6:40 AM      Result Value Range Status   Specimen Description BLOOD LEFT ARM   Final   Special Requests BOTTLES DRAWN AEROBIC AND ANAEROBIC 5 CC EACH   Final   Culture  Setup Time 07/10/2012 10:36   Final   Culture     Final   Value:        BLOOD CULTURE RECEIVED NO GROWTH  TO DATE CULTURE WILL BE HELD FOR 5 DAYS BEFORE ISSUING A FINAL NEGATIVE REPORT   Report Status PENDING   Incomplete  CULTURE, BLOOD (ROUTINE X 2)     Status: None   Collection Time    07/10/12  6:43 AM      Result Value Range Status   Specimen Description BLOOD RIGHT HAND   Final   Special Requests BOTTLES DRAWN AEROBIC ONLY 2 CC   Final   Culture  Setup Time 07/10/2012 10:36   Final   Culture     Final   Value:        BLOOD CULTURE RECEIVED NO GROWTH TO DATE CULTURE WILL BE HELD FOR 5 DAYS BEFORE ISSUING A FINAL NEGATIVE REPORT   Report Status PENDING   Incomplete     Studies/Results: Ct Abdomen Pelvis Wo Contrast  07/10/2012   *RADIOLOGY REPORT*  Clinical Data: Abdominal pain.  CT ABDOMEN AND PELVIS WITHOUT CONTRAST  Technique:  Multidetector CT imaging of the abdomen and pelvis was performed following the standard protocol without intravenous contrast.  Comparison: None.  Findings: The lung bases demonstrate small bilateral pleural effusions and bibasilar atelectasis.  The heart is enlarged and there are extensive dense coronary artery calcifications.  There is an NG tube coursing down the esophagus and into the stomach.  The tip is in the second portion of the duodenum.  The liver contour is irregular suggesting cirrhosis.  There is extensive abdominal and pelvic ascites along with diffuse anasarca. No focal hepatic lesions or intrahepatic biliary dilatation.  No common bile duct dilatation.  The gallbladder appears normal.  The spleen is normal in size.  The pancreas is grossly normal.  Both adrenal glands are slightly enlarged but no discrete mass.  The kidneys are grossly normal.  No renal calculi or hydronephrosis.  The stomach, duodenum, small bowel and colon are grossly normal without oral contrast.  No findings for obstruction and/or perforation.  Extensive vascular calcifications without definite aneurysm.  No mesenteric or retroperitoneal mass or adenopathy.  There is a Foley catheter in  the bladder.  The uterus and ovaries are grossly normal.  Large volume pelvic ascites.  No mass or adenopathy.  There are enlarged inguinal lymph nodes bilaterally.  The bony structures are grossly intact.  Advanced osteoporosis and degenerative changes.  IMPRESSION:  1.  Large volume abdominal/pelvic ascites and diffuse anasarca. 2.  Irregular contour of the liver suggesting cirrhosis. 3.  Advanced atherosclerotic calcifications involving the major vascular structures. 4.  The NG tube tip is in the second portion  of the duodenum.   Original Report Authenticated By: Rudie Meyer, M.D.   Dg Chest Port 1 View  07/11/2012   *RADIOLOGY REPORT*  Clinical Data: Shortness of breath, congestive heart failure  PORTABLE CHEST - 1 VIEW  Comparison: Portable chest x-ray of 07/10/2012  Findings: Aeration has improved slightly.  There is still evidence of pulmonary vascular congestion with probable bilateral pleural effusions.  Cardiomegaly is stable.  An NG tube tip seen only to the lower esophagus, not visualized below the hemidiaphragm on the current film.  IMPRESSION: Slightly better aeration.  Persistent mild CHF.   Original Report Authenticated By: Dwyane Dee, M.D.   Dg Abd 2 Views  07/11/2012   *RADIOLOGY REPORT*  Clinical Data: Small bowel obstruction.  Fecal impaction.  ABDOMEN - 2 VIEW  Comparison: Radiographs and CT scan dated 07/10/2012  Findings: NG tube has been inserted and the stomach is now decompressed.  There is no free air in the abdomen.  There are no dilated loops of bowel. No appreciable stool in the rectum.  No acute osseous abnormality.  IMPRESSION:  1.  No fecal impaction. 2.  No small bowel obstruction. 3.  Stomach has been decompressed by the NG tube.   Original Report Authenticated By: Francene Boyers, M.D.    Medications: Scheduled Meds: . cefTRIAXone (ROCEPHIN)  IV  1 g Intravenous Q24H  . fluconazole (DIFLUCAN) IV  100 mg Intravenous Daily  . insulin aspart  0-9 Units Subcutaneous Q4H  .  levalbuterol  0.63 mg Nebulization Q8H  . mineral oil  1 enema Rectal Once  . pantoprazole (PROTONIX) IV  40 mg Intravenous Q12H  . simvastatin  20 mg Oral QHS  . sodium chloride  3 mL Intravenous Q12H   Continuous Infusions: . sodium chloride 10 mL/hr at 07/10/12 1900  . dextrose 5 % and 0.45% NaCl 50 mL/hr (07/11/12 2254)   PRN Meds:.acetaminophen, acetaminophen, levalbuterol, lip balm, ondansetron    LOS: 5 days   Misheel Gowans,CHRISTOPHER  [facility] Pager [phone]. If 8PM-8AM, please contact night-coverage at [email or web address], password [facility] 07/12/2012, 7:27 AM  LOS: 5 days

## 2012-07-12 NOTE — Progress Notes (Signed)
PT requested that RT removed BiPAP and place back on 6 lpm [place]- RN aware. RT educated PT on usgae of BiPAP while sleeping. Sp02 97%, HR 89, RR 17- PT is awake at this time.

## 2012-07-13 ENCOUNTER — Inpatient Hospital Stay (HOSPITAL_COMMUNITY): Payer: Medicare Other

## 2012-07-13 LAB — PROTIME-INR: Prothrombin Time: 34.8 seconds — ABNORMAL HIGH (ref 11.6–15.2)

## 2012-07-13 LAB — GLUCOSE, CAPILLARY
Glucose-Capillary: 76 mg/dL (ref 70–99)
Glucose-Capillary: 84 mg/dL (ref 70–99)
Glucose-Capillary: 100 mg/dL — ABNORMAL HIGH (ref 70–99)

## 2012-07-13 LAB — CBC
HCT: 38.3 % (ref 36.0–46.0)
MCHC: 25.3 g/dL — ABNORMAL LOW (ref 30.0–36.0)
MCV: 104.1 fL — ABNORMAL HIGH (ref 78.0–100.0)
RDW: 19.2 % — ABNORMAL HIGH (ref 11.5–15.5)

## 2012-07-13 LAB — BODY FLUID CELL COUNT WITH DIFFERENTIAL

## 2012-07-13 LAB — BASIC METABOLIC PANEL
BUN: 31 mg/dL — ABNORMAL HIGH (ref 6–23)
Calcium: 8.5 mg/dL (ref 8.4–10.5)
Chloride: 103 mEq/L (ref 96–112)
Creatinine, Ser: 1.28 mg/dL — ABNORMAL HIGH (ref 0.50–1.10)
GFR calc Af Amer: 45 mL/min — ABNORMAL LOW (ref >90)

## 2012-07-13 MED ORDER — VITAMIN K1 10 MG/ML IJ SOLN
10.0000 mg | Freq: Every day | INTRAMUSCULAR | Status: DC
  Administered 2012-07-13 – 2012-07-14 (×2): 10 mg via SUBCUTANEOUS
  Filled 2012-07-13 (×2): qty 1

## 2012-07-13 MED ORDER — FLUCONAZOLE 100 MG PO TABS
100.0000 mg | ORAL_TABLET | Freq: Every day | ORAL | Status: AC
  Administered 2012-07-13 – 2012-07-16 (×4): 100 mg via ORAL
  Filled 2012-07-13 (×4): qty 1

## 2012-07-13 NOTE — Procedures (Addendum)
Successful US guided paracentesis from left lateral abdomen.  Yielded 5.6L of amber colored fluid.  No immediate complications.  Pt tolerated well.   Specimen was sent for labs.  Brayton El PA-C 07/13/2012 3:48 PM

## 2012-07-13 NOTE — Progress Notes (Signed)
[facility] PROGRESS NOTE  Holly [name] ZOX:[id number] DOB: [birth year] DOA: 07/07/2012 PCP: [name], MD  Assessment/Plan: Active Problems:   DIABETES MELLITUS, TYPE II   Morbid obesity   CORONARY ARTERY DISEASE   INTERSTITIAL LUNG DISEASE   Acute on chronic renal failure   Unspecified constipation   Cirrhosis of liver without mention of alcohol   Ascites    1. Nausea/Vomiting: Patient has a known history of chronic nausea with occasional vomiting. On 07/09/12, she developed acute nausea/vomiting, associated with coffee grounds, in the setting of Coumadin therapy. Etiology is suspected to be gastritis vs esophagitis. Managed with bowel rest, NG-T, antiemetics and PPI. Dr Lina Sar provided GI consultation. KUB revealed moderate gastric distention, and abdominal /pelvic CT scan showed no evidence of of obstruction. She may have underlying gastroparesis. Initial NG-T output was quite large, but as of 07/11/12, on GI recommendations, we were able to clamp, then discontinue NG-T, without deleterious effect. Patient was successfully advanced to regular diet on 07/12/12. 2. Fecal Impaction/constipation:  Patient has been troubled by constipation, sometimes alternating with loose stools, and takes anti-diarrheals as needed. Suspect multifactorial (immobility, medications, maybe IBS), with superimposed acute constipation. KUB /CTscan negative for obstructive process. Managed with enemas' with satisfactory response. Now resolved.  3. Hepatic Cirrhosis/Ascites: CT scan demonstrated large volume abdominal/pelvic ascites and diffuse anasarca. Irregular contour of the liver suggesting cirrhosis. Differential-diagnostic considerations include NASH, vs cardiac cirrhosis. Hepatitis B & C serologies are negative. 2D Echocardiogram was done on 07/11/12 and revealed EF of 55% to 60%, diastolic flattening of interventricular septum, moderately dilated LA, moderately dilated RV, mildly dilated RA,  severe Tricuspid regurgitation. PA peak pressure: 90mm Hg (S), all consistent with pulmonary HTN and right heart failure. Per GI, to consider diagnostic/Therapeutic paracentesis when renal function and INR improve. Meanwhile, low sodium diet/IV Lasix.  4. Acute respiratory failure: Patient developed acute hypoxic respiratory failure on 07/10/12, following administration of Phenergan, for emesis. Dr Coralyn Helling provided pulmonary consultation, and has opined that this was multi-factorial, acute on chronic hypoxic resp failure in the setting of MO, OSA, AMS from sedation, lack of mandatory Cpap. Possible aspiration, is also a consideration. Patient apparently, also has ILD, under care of Dr Marcelyn Bruins, pulmonologist. Managed as recommended, with satisfactory clinical improvement. Currently saturating at 95% on 6L. CXR indicated mild CHF.  5. Chronic Diastolic Heart Failure: Patient has a known history of diastolic CHF. This may have been contributory to acute decompensation of respiratory status. See #4 above. Lasix temporarily placed on hold, due to ARF. Lasix was recommenced on 07/12/12, and patient appears to be tolerating this.  6. Acute on chronic Renal Failure: At presentation, creatinine was 2.45. Probably pre renal (ATN) in setting of poor oral intake, diuretic, ACE and UTI. Managed with IV fluids, ACE-i placed on hold (baseline CR About 1.6). Urine output has been good, and creatinine has trended down. Now 1.28 as of 07/13/12.   7. UTI: UA with positive nitrates, WBC 21 to 50. Urine culture grew pan-sensitive E. Coli. Managing with iv Rocephin, now day 37. Will conclude antibiotics today.  8. Diabetes: This is type-2 and CBGs have remained satisfactory on SSI. HBA1C is 6.  9. A fib: Rate-controlled on Metoprolol. Coumadin is currently on hold.  10. Morbid Obesity/OSA: On nocturnal CPAP.  11. RA: Continued on Plaquenil. Not problematic. 12. Thrush; Patient  Was found to have oral thrush incidentally, on  physical examination. Initially managed with Nystatin swish. Changed to Diflucan on 07/10/12, now day #  4.  13. Thrombocytopenia: Chronic, stable. Following CBC.    Code Status: Full Code.  Family Communication:  Disposition Plan: To be determined.    Brief narrative: 79 yo female, with known RA, Morbid obesity, DM, Diastolic Heart Failure, CAD, A fib on coumadin, CKD, OSA, Cpap dependent.who was referred by PCP to the ED for evaluation of worsening renal function. Patient saw her PCP day prior to admission, because she was having problems with constipation and decreased urine output. She did develop brownish emesis 07/10/12 and was treated with Phenergan for nausea/vomiting. She was moved to the ICU afterwards, for AMS following Phenergan and was found to be hypoxic by ABG, consistent with hypoventilation. PCCM was consulted to assess acute on chronic respiratory failure.   Consultants:  Dr Coralyn Helling, PCCM.  Dr Lina Sar, GI.  Procedures:  See Notes.   Antibiotics:  Rocephin 07/07/12>>>  Vancomycin 07/09/12>>>  Diflucan 07/10/12>>>  HPI/Subjective: Feels much better.   Objective: Vital signs in last 24 hours: Temp:  [97.8 F (36.6 C)-99.1 F (37.3 C)] 98.5 F (36.9 C) (06/05 0400) Pulse Rate:  [79-92] 89 (06/05 0442) Resp:  [15-25] 19 (06/05 0442) BP: (105-145)/(58-77) 114/62 mmHg (06/05 0442) SpO2:  [89 %-98 %] 93 % (06/05 0600) Weight:  [117.4 kg (258 lb 13.1 oz)] 117.4 kg (258 lb 13.1 oz) (06/05 0442) Weight change: -0.9 kg (-1 lb 15.8 oz) Last BM Date: 07/12/12  Intake/Output from previous day: 06/04 0701 - 06/05 0700 In: 1800 [P.O.:630; I.V.:1070; IV Piggyback:100] Out: 550 [Urine:550]     Physical Exam: General: Comfortable, alert, communicative, fully oriented, not short of breath at rest.  HEENT:  Mild clinical pallor, no jaundice, no conjunctival injection or discharge. NECK:  Supple, JVP not seen, no carotid bruits, no palpable lymphadenopathy, no  palpable goiter. CHEST:  Clinically clear to auscultation, no wheezes, no crackles. HEART:  Sounds 1 and 2 heard, normal, irregular, no murmurs. ABDOMEN:  Morbidly obese, soft, non-tender, no palpable organomegaly, no palpable masses, normal bowel sounds. GENITALIA:  Not examined. LOWER EXTREMITIES:  Moderate pitting edema, palpable peripheral pulses. MUSCULOSKELETAL SYSTEM:  Generalized osteoarthritic changes, otherwise, normal. CENTRAL NERVOUS SYSTEM:  No focal neurologic deficit on gross examination.  Lab Results:  Recent Labs  07/11/12 0045  07/12/12 1720 07/13/12 0331  WBC 9.5  --   --  7.0  HGB 10.6*  < > 10.3* 9.7*  HCT 34.0*  < > 33.2* 38.3  PLT PLATELET CLUMPS NOTED ON SMEAR, COUNT APPEARS ADEQUATE  --   --  98*  < > = values in this interval not displayed.  Recent Labs  07/12/12 0100 07/13/12 0331  NA 139 136  K 3.8 3.7  CL 106 103  CO2 25 24  GLUCOSE 94 82  BUN 34* 31*  CREATININE 1.26* 1.28*  CALCIUM 8.7 8.5   Recent Results (from the past 240 hour(s))  URINE CULTURE     Status: None   Collection Time    07/07/12 12:27 PM      Result Value Range Status   Specimen Description URINE, RANDOM   Final   Special Requests NONE   Final   Culture  Setup Time 07/07/2012 23:01   Final   Colony Count >=100,000 COLONIES/ML   Final   Culture ESCHERICHIA COLI   Final   Report Status 07/09/2012 FINAL   Final   Organism ID, Bacteria ESCHERICHIA COLI   Final  MRSA PCR SCREENING     Status: None   Collection Time  07/10/12  5:33 AM      Result Value Range Status   MRSA by PCR NEGATIVE  NEGATIVE Final   Comment:            The GeneXpert MRSA Assay (FDA     approved for NASAL specimens     only), is one component of a     comprehensive MRSA colonization     surveillance program. It is not     intended to diagnose MRSA     infection nor to guide or     monitor treatment for     MRSA infections.  CULTURE, BLOOD (ROUTINE X 2)     Status: None   Collection Time     07/10/12  6:40 AM      Result Value Range Status   Specimen Description BLOOD LEFT ARM   Final   Special Requests BOTTLES DRAWN AEROBIC AND ANAEROBIC 5 CC EACH   Final   Culture  Setup Time 07/10/2012 10:36   Final   Culture     Final   Value:        BLOOD CULTURE RECEIVED NO GROWTH TO DATE CULTURE WILL BE HELD FOR 5 DAYS BEFORE ISSUING A FINAL NEGATIVE REPORT   Report Status PENDING   Incomplete  CULTURE, BLOOD (ROUTINE X 2)     Status: None   Collection Time    07/10/12  6:43 AM      Result Value Range Status   Specimen Description BLOOD RIGHT HAND   Final   Special Requests BOTTLES DRAWN AEROBIC ONLY 2 CC   Final   Culture  Setup Time 07/10/2012 10:36   Final   Culture     Final   Value:        BLOOD CULTURE RECEIVED NO GROWTH TO DATE CULTURE WILL BE HELD FOR 5 DAYS BEFORE ISSUING A FINAL NEGATIVE REPORT   Report Status PENDING   Incomplete     Studies/Results: Dg Chest Port 1 View  07/12/2012   *RADIOLOGY REPORT*  Clinical Data: Pulmonary edema  PORTABLE CHEST - 1 VIEW  Comparison: Prior chest x-ray 07/11/2012  Findings: Slightly decreased interstitial edema.  Persistent cardiomegaly with pulmonary vascular congestion.  The main central pulmonary arteries are enlarged.  Inspiratory volumes are low and there is bibasilar atelectasis.  IMPRESSION:  1.  Slightly improved interstitial edema 2.  Pulmonary arterial enlargements suggest underlying pulmonary arterial hypertension 3.  Stable cardiomegaly and pulmonary vascular congestion   Original Report Authenticated By: Malachy Moan, M.D.    Medications: Scheduled Meds: . antiseptic oral rinse  15 mL Mouth Rinse q12n4p  . cefTRIAXone (ROCEPHIN)  IV  1 g Intravenous Q24H  . chlorhexidine  15 mL Mouth Rinse BID  . fluconazole (DIFLUCAN) IV  100 mg Intravenous Daily  . furosemide  40 mg Intravenous Daily  . insulin aspart  0-9 Units Subcutaneous Q4H  . levalbuterol  0.63 mg Nebulization Q8H  . mineral oil  1 enema Rectal Once  .  pantoprazole  40 mg Oral BID  . rOPINIRole  0.5 mg Oral BID  . simvastatin  20 mg Oral QHS  . sodium chloride  3 mL Intravenous Q12H   Continuous Infusions: . sodium chloride 10 mL/hr at 07/10/12 1900  . dextrose 5 % and 0.45% NaCl 50 mL/hr (07/11/12 2254)   PRN Meds:.acetaminophen, acetaminophen, levalbuterol, lip balm, ondansetron    LOS: 6 days   Holly [name],Holly [name]  [facility] Pager [phone]. If 8PM-8AM, please contact night-coverage at [email or web address],  password [facility] 07/13/2012, 8:58 AM  LOS: 6 days

## 2012-07-13 NOTE — Progress Notes (Signed)
Pt refuses to keep BiPap mask on consistently throughout the night. Pt states "I have been wearing this for 4 years. I can do what I want." Attempted to educate pt on importance of mask/machine. Pt continues to refuse mask. SpO2 intermittent checks Q1H remain 93-96%. VSS. Will continue to monitor. Holly [name]

## 2012-07-13 NOTE — Progress Notes (Signed)
[facility] Progress Note   Subjective  no specific complaints. Tolerating solids.    Objective   Vital signs in last 24 hours: Temp:  [97.8 F (36.6 C)-99.1 F (37.3 C)] 98.5 F (36.9 C) (06/05 0400) Pulse Rate:  [79-94] 82 (06/05 1000) Resp:  [15-25] 19 (06/05 1000) BP: (105-145)/(58-77) 107/63 mmHg (06/05 1000) SpO2:  [84 %-98 %] 92 % (06/05 1000) Weight:  [258 lb 13.1 oz (117.4 kg)] 258 lb 13.1 oz (117.4 kg) (06/05 0442) Last BM Date: 07/12/12 General:    Pleasant, obese white female in NAD Heart:  Regular rate and rhythm Lungs: Respirations slightly labored, on 4 liter O2  Abdomen:  Soft, obese, nontender still with some subcutaneous edema. Normal bowel sounds. Extremities:  1+ Bilateral upper and lower extremity edema ( improved) Neurologic:  Alert and oriented,  grossly normal neurologically. Psych:  Cooperative. Normal mood and affect.   Lab Results:  Recent Labs  07/11/12 0045  07/12/12 0825 07/12/12 1720 07/13/12 0331  WBC 9.5  --   --   --  7.0  HGB 10.6*  < > 9.6* 10.3* 9.7*  HCT 34.0*  < > 31.0* 33.2* 38.3  PLT PLATELET CLUMPS NOTED ON SMEAR, COUNT APPEARS ADEQUATE  --   --   --  98*  < > = values in this interval not displayed. BMET  Recent Labs  07/11/12 0045 07/12/12 0100 07/13/12 0331  NA 142 139 136  K 4.8 3.8 3.7  CL 107 106 103  CO2 21 25 24   GLUCOSE 87 94 82  BUN 40* 34* 31*  CREATININE 1.38* 1.26* 1.28*  CALCIUM 8.9 8.7 8.5   LFT  Recent Labs  07/11/12 0045  PROT 6.5  ALBUMIN 2.9*  AST 63*  ALT 21  ALKPHOS 71  BILITOT 0.6   PT/INR  Recent Labs  07/12/12 0100 07/13/12 0331  LABPROT 33.0* 34.8*  INR 3.48* 3.74*    Studies/Results: Dg Chest Port 1 View  07/12/2012   *RADIOLOGY REPORT*  Clinical Data: Pulmonary edema  PORTABLE CHEST - 1 VIEW  Comparison: Prior chest x-ray 07/11/2012  Findings: Slightly decreased interstitial edema.  Persistent cardiomegaly with pulmonary vascular congestion.  The main  central pulmonary arteries are enlarged.  Inspiratory volumes are low and there is bibasilar atelectasis.  IMPRESSION:  1.  Slightly improved interstitial edema 2.  Pulmonary arterial enlargements suggest underlying pulmonary arterial hypertension 3.  Stable cardiomegaly and pulmonary vascular congestion   Original Report Authenticated By: Malachy Moan, M.D.     Assessment / Plan:   1. Chronic constipation / diarrhea. Constipated on admission, resolved.   2. Acute on chronic nausea. Resolved. Tolerating solids.   3. Low volume hematemesis, resolved. Continue BID PPI. Hgb stable.   3. Cirrhosis, probably NASH, cardiac cirrhosis not excluded. No focal liver lesions on non-contrast CTscan. HBV, HCV negative. Large volume ascites / anasarca on scan which may be combination of cirrhosis and CHF. Will consider diagnostic paracentesis when acute issues and INR improve. We can possibly do a large volume paracentesis with fluid studies when medical issues stable./ INR down. Renal function improving, IV lasix started yesterday. Foley out so unable to monitor output but edema decreasing. Weight down 2 pounds from yesterday. Continue 2 grams sodium diet. Her INR is rising despite no coumadin in a few days. See #2. No evidence for hepatic encephalopathy.  4. Afib, coumadin on hold for a few days but.INR still rising (3.74 today). INR may be rising partly from antibiotics and /or  liver disease.   5. Acute on chronic respiratory failure.   5. Multiple, significant medical issues    LOS: 6 days   Willette Cluster  07/13/2012, 10:20 AM  I have personally taken an interval history, reviewed the chart, and examined the patient.  I agree with the extender's note, impression and recommendations.  Okay for large volume paracentesis but I would not exceed 4-5 L. Would check ascitic fluid cytology.  Barbette Hair. Arlyce Dice, MD, [facility] [phone]

## 2012-07-14 LAB — COMPREHENSIVE METABOLIC PANEL
ALT: 22 U/L (ref 0–35)
AST: 34 U/L (ref 0–37)
CO2: 22 mEq/L (ref 19–32)
Calcium: 8.1 mg/dL — ABNORMAL LOW (ref 8.4–10.5)
Chloride: 103 mEq/L (ref 96–112)
GFR calc non Af Amer: 47 mL/min — ABNORMAL LOW (ref >90)
Sodium: 134 mEq/L — ABNORMAL LOW (ref 135–145)

## 2012-07-14 LAB — CBC
MCH: 27.6 pg (ref 26.0–34.0)
Platelets: 76 10*3/uL — ABNORMAL LOW (ref 150–400)
RBC: 3.66 MIL/uL — ABNORMAL LOW (ref 3.87–5.11)
WBC: 6.5 10*3/uL (ref 4.0–10.5)

## 2012-07-14 LAB — GLUCOSE, CAPILLARY
Glucose-Capillary: 85 mg/dL (ref 70–99)
Glucose-Capillary: 102 mg/dL — ABNORMAL HIGH (ref 70–99)
Glucose-Capillary: 102 mg/dL — ABNORMAL HIGH (ref 70–99)
Glucose-Capillary: 97 mg/dL (ref 70–99)

## 2012-07-14 LAB — PROTIME-INR: Prothrombin Time: 26.4 seconds — ABNORMAL HIGH (ref 11.6–15.2)

## 2012-07-14 LAB — ALBUMIN, FLUID (OTHER): Albumin, Fluid: 1.8 g/dL

## 2012-07-14 MED ORDER — SPIRONOLACTONE 50 MG PO TABS
50.0000 mg | ORAL_TABLET | Freq: Two times a day (BID) | ORAL | Status: DC
  Administered 2012-07-14 – 2012-07-17 (×6): 50 mg via ORAL
  Filled 2012-07-14 (×8): qty 1

## 2012-07-14 NOTE — Progress Notes (Signed)
[facility] Progress Note   Subjective  *Underwent *large volume paracentesis and feels better.*   Objective  Vital signs in last 24 hours: Temp:  [98 F (36.7 C)-98.7 F (37.1 C)] 98.2 F (36.8 C) (06/06 1820) Pulse Rate:  [67-87] 67 (06/06 1820) Resp:  [13-25] 20 (06/06 1820) BP: (111-125)/(59-93) 119/93 mmHg (06/06 1820) SpO2:  [89 %-99 %] 94 % (06/06 1820) Weight:  [248 lb 7.3 oz (112.7 kg)-248 lb 10.9 oz (112.8 kg)] 248 lb 7.3 oz (112.7 kg) (06/06 1820) Last BM Date: 07/14/12 General:   Alert,  Well-developed,  white female in NAD Heart:  Regular rate and rhythm; no murmurs Abdomen:  Soft, obese with obvious ascites  Extremities:  Without edema. Neurologic:  Alert and  oriented x4;  grossly normal neurologically. Psych:  Alert and cooperative. Normal mood and affect.  Intake/Output from previous day: 06/05 0701 - 06/06 0700 In: 1220 [P.O.:120; I.V.:1050; IV Piggyback:50] Out: -  Intake/Output this shift: Total I/O In: 350 [I.V.:350] Out: -   Lab Results:  Recent Labs  07/12/12 1720 07/13/12 0331 07/14/12 0325  WBC  --  7.0 6.5  HGB 10.3* 9.7* 10.1*  HCT 33.2* 38.3 31.5*  PLT  --  98* 76*   BMET  Recent Labs  07/12/12 0100 07/13/12 0331 07/14/12 0325  NA 139 136 134*  K 3.8 3.7 3.7  CL 106 103 103  CO2 25 24 22   GLUCOSE 94 82 90  BUN 34* 31* 27*  CREATININE 1.26* 1.28* 1.10  CALCIUM 8.7 8.5 8.1*   LFT  Recent Labs  07/14/12 0325  PROT 5.3*  ALBUMIN 2.3*  AST 34  ALT 22  ALKPHOS 61  BILITOT 0.5   PT/INR  Recent Labs  07/13/12 0331 07/14/12 0325  LABPROT 34.8* 26.4*  INR 3.74* 2.58*   Hepatitis Panel No results found for this basename: HEPBSAG, HCVAB, HEPAIGM, HEPBIGM,  in the last 72 hours  Studies/Results: US Paracentesis  07/13/2012   *RADIOLOGY REPORT*  Clinical Data: History of cirrhosis, abdominal distension and pain, ascites.  Request for diagnostic and therapeutic paracentesis.  ULTRASOUND GUIDED PARACENTESIS   Comparison:  None  An ultrasound guided paracentesis was thoroughly discussed with the patient and questions answered.  The benefits, risks, alternatives and complications were also discussed.  The patient understands and wishes to proceed with the procedure.  Written consent was obtained.  Ultrasound was performed to localize and mark an adequate pocket of fluid in the right  lateral quadrant of the abdomen.  The area was then prepped and draped in the normal sterile fashion.  1% Lidocaine was used for local anesthesia.  Under ultrasound guidance a 19 gauge Yueh catheter was introduced.  Paracentesis was performed.  The catheter was removed and a dressing applied.  Complications:  None  Findings:  A total of approximately 5.6 liters of amber colored fluid was removed.  A fluid sample was sent for laboratory analysis.  IMPRESSION: Successful ultrasound guided paracentesis yielding 5.6 liters of ascites.  Read by Brayton El PA-C   Original Report Authenticated By: Jolaine Click, M.D.      Assessment & Plan  *Cirrhosis - s/p theraputic paracentesis which looks like a transudate.  May begin aldactone 50mg  bid, switch to lasix 40mg  qd, follow renal function.  Will eventually need EGD which could be done as outpt.  Will followup on 6/8. ** Active Problems:   DIABETES MELLITUS, TYPE II   Morbid obesity   CORONARY ARTERY DISEASE   INTERSTITIAL LUNG DISEASE  Acute on chronic renal failure   Unspecified constipation   Cirrhosis of liver without mention of alcohol   Ascites     LOS: 7 days   Melvia Heaps  07/14/2012, 6:53 PM

## 2012-07-14 NOTE — Progress Notes (Signed)
[facility] PROGRESS NOTE  Holly [name] WGN:[id number] DOB: 08-12-1931 DOA: 07/07/2012 PCP: Holly Alken, MD  Assessment/Plan: Active Problems:   DIABETES MELLITUS, TYPE II   Morbid obesity   CORONARY ARTERY DISEASE   INTERSTITIAL LUNG DISEASE   Acute on chronic renal failure   Unspecified constipation   Cirrhosis of liver without mention of alcohol   Ascites    1. Nausea/Vomiting: Patient has a known history of chronic nausea with occasional vomiting. On 07/09/12, she developed acute nausea/vomiting, associated with coffee grounds, in the setting of Coumadin therapy. Etiology is suspected to be gastritis vs esophagitis. Managed with bowel rest, NG-T, antiemetics and PPI. Dr Lina Sar provided GI consultation. KUB revealed moderate gastric distention, and abdominal /pelvic CT scan showed no evidence of of obstruction. She may have underlying gastroparesis. Initial NG-T output was quite large, but as of 07/11/12, on GI recommendations, we were able to clamp, then discontinue NG-T, without deleterious effect. Patient was successfully advanced to regular diet on 07/12/12. Resolved.  2. Fecal Impaction/constipation:  Patient has been troubled by constipation, sometimes alternating with loose stools, and takes anti-diarrheals as needed. Suspect multifactorial (immobility, medications, maybe IBS), with superimposed acute constipation. KUB /CTscan negative for obstructive process. Managed with enemas' with satisfactory response. Now resolved.  3. Hepatic Cirrhosis/Ascites: CT scan demonstrated large volume abdominal/pelvic ascites and diffuse anasarca. Irregular contour of the liver suggesting cirrhosis. Differential-diagnostic considerations include NASH, vs cardiac cirrhosis. Hepatitis B & C serologies are negative. 2D Echocardiogram was done on 07/11/12 and revealed EF of 55% to 60%, diastolic flattening of interventricular septum, moderately dilated LA, moderately dilated RV, mildly  dilated RA, severe Tricuspid regurgitation. PA peak pressure: 90mm Hg (S), all consistent with pulmonary HTN and right heart failure. Diagnostic/Therapeutic paracentesis was done by IR on 07/13/12, with evacuation of 5.6 L fluid. Cell count is only 17 erasing concern for SBP. Ascitic fluid albumin is pending, as is cytology. Meanwhile, low sodium diet/IV Lasix.  4. Acute respiratory failure: Patient developed acute hypoxic respiratory failure on 07/10/12, following administration of Phenergan, for emesis. Dr Coralyn Helling provided pulmonary consultation, and has opined that this was multi-factorial, acute on chronic hypoxic resp failure in the setting of MO, OSA, AMS from sedation, lack of mandatory Cpap. Possible aspiration, is also a consideration. Patient apparently, also has ILD, under care of Dr Marcelyn Bruins, pulmonologist. CXR indicated mild CHF. Respitatory status has considerably improved with large volume paracentesis, utilization of nocturnal Bipap and diuresis. Saturating today, at 96%-97% on 2L.  5. Chronic Diastolic Heart Failure: Patient has a known history of diastolic CHF. This may have been contributory to acute decompensation of respiratory status. See #4 above. Lasix temporarily placed on hold, due to ARF. Lasix was recommenced on 07/12/12, and patient appears to be tolerating this.  6. Acute on chronic Renal Failure: At presentation, creatinine was 2.45. Probably pre renal (ATN) in setting of poor oral intake, diuretic, ACE and UTI. Managed with IV fluids, ACE-i placed on hold (baseline CR About 1.6). Urine output has been good, and creatinine has trended down, and as of 07/14/12, has normalized at 1.10.   7. UTI: UA with positive nitrates, WBC 21 to 50. Urine culture grew pan-sensitive E. Coli. Managed with iv Rocephin. Antibiotics were concluded on 07/13/12, after 7 days of therapy.  8. Diabetes: This is type-2 and CBGs have remained satisfactory on SSI. HBA1C is 6.  9. A fib: Rate-controlled on  Metoprolol. Coumadin is currently on hold, as INR is supra-therapeutic. As INR  is 2.58 today, will discontinue Vit K, which was commenced on 07/13/12.  10. Morbid Obesity/OSA: On nocturnal CPAP.  11. RA: Continued on Plaquenil. Not problematic. 12. Thrush; Patient  Was found to have oral thrush incidentally, on physical examination. Initially managed with Nystatin swish. Changed to Diflucan on 07/10/12, now day #5.  13. Thrombocytopenia: Chronic, stable. Following CBC.    Code Status: Full Code.  Family Communication: Family fully updated at bedside.  Disposition Plan: To be determined. Stable for transfer to telemetry floor.    Brief narrative: 79 yo female, with known RA, Morbid obesity, DM, Diastolic Heart Failure, CAD, A fib on coumadin, CKD, OSA, Cpap dependent.who was referred by PCP to the ED for evaluation of worsening renal function. Patient saw her PCP day prior to admission, because she was having problems with constipation and decreased urine output. She did develop brownish emesis 07/10/12 and was treated with Phenergan for nausea/vomiting. She was moved to the ICU afterwards, for AMS following Phenergan and was found to be hypoxic by ABG, consistent with hypoventilation. PCCM was consulted to assess acute on chronic respiratory failure.   Consultants:  Dr Coralyn Helling, PCCM.  Dr Lina Sar, GI.  Procedures:  See Notes.   Antibiotics:  Rocephin 07/07/12-07/13/12.   Vancomycin 07/09/12-6//3/14.   Diflucan 07/10/12>>>  HPI/Subjective: No new issues, apart from some leakage of ascitic fluid.   Objective: Vital signs in last 24 hours: Temp:  [98 F (36.7 C)-98.7 F (37.1 C)] 98.4 F (36.9 C) (06/06 1200) Pulse Rate:  [78-87] 80 (06/06 1400) Resp:  [13-25] 23 (06/06 1400) BP: (95-125)/(54-92) 114/59 mmHg (06/06 0740) SpO2:  [89 %-98 %] 97 % (06/06 1400) Weight:  [112.8 kg (248 lb 10.9 oz)] 112.8 kg (248 lb 10.9 oz) (06/06 0600) Weight change: -4.6 kg (-10 lb 2.3 oz) Last  BM Date: 07/14/12  Intake/Output from previous day: 06/05 0701 - 06/06 0700 In: 1220 [P.O.:120; I.V.:1050; IV Piggyback:50] Out: -  Total I/O In: 350 [I.V.:350] Out: -    Physical Exam: General: Comfortable, alert, communicative, fully oriented, not short of breath at rest.  HEENT:  Mild clinical pallor, no jaundice, no conjunctival injection or discharge. NECK:  Supple, JVP not seen, no carotid bruits, no palpable lymphadenopathy, no palpable goiter. CHEST:  Clinically clear to auscultation, no wheezes, no crackles. HEART:  Sounds 1 and 2 heard, normal, irregular, no murmurs. ABDOMEN:  Morbidly obese, soft, non-tender, no palpable organomegaly, no palpable masses, normal bowel sounds. Dressings at site of paracentesis are wet.  GENITALIA:  Not examined. LOWER EXTREMITIES:  Moderate pitting edema, palpable peripheral pulses. MUSCULOSKELETAL SYSTEM:  Generalized osteoarthritic changes, otherwise, normal. CENTRAL NERVOUS SYSTEM:  No focal neurologic deficit on gross examination.  Lab Results:  Recent Labs  07/13/12 0331 07/14/12 0325  WBC 7.0 6.5  HGB 9.7* 10.1*  HCT 38.3 31.5*  PLT 98* 76*    Recent Labs  07/13/12 0331 07/14/12 0325  NA 136 134*  K 3.7 3.7  CL 103 103  CO2 24 22  GLUCOSE 82 90  BUN 31* 27*  CREATININE 1.28* 1.10  CALCIUM 8.5 8.1*   Recent Results (from the past 240 hour(s))  URINE CULTURE     Status: None   Collection Time    07/07/12 12:27 PM      Result Value Range Status   Specimen Description URINE, RANDOM   Final   Special Requests NONE   Final   Culture  Setup Time 07/07/2012 23:01   Final   Colony  Count >=100,000 COLONIES/ML   Final   Culture ESCHERICHIA COLI   Final   Report Status 07/09/2012 FINAL   Final   Organism ID, Bacteria ESCHERICHIA COLI   Final  MRSA PCR SCREENING     Status: None   Collection Time    07/10/12  5:33 AM      Result Value Range Status   MRSA by PCR NEGATIVE  NEGATIVE Final   Comment:            The  GeneXpert MRSA Assay (FDA     approved for NASAL specimens     only), is one component of a     comprehensive MRSA colonization     surveillance program. It is not     intended to diagnose MRSA     infection nor to guide or     monitor treatment for     MRSA infections.  CULTURE, BLOOD (ROUTINE X 2)     Status: None   Collection Time    07/10/12  6:40 AM      Result Value Range Status   Specimen Description BLOOD LEFT ARM   Final   Special Requests BOTTLES DRAWN AEROBIC AND ANAEROBIC 5 CC EACH   Final   Culture  Setup Time 07/10/2012 10:36   Final   Culture     Final   Value:        BLOOD CULTURE RECEIVED NO GROWTH TO DATE CULTURE WILL BE HELD FOR 5 DAYS BEFORE ISSUING A FINAL NEGATIVE REPORT   Report Status PENDING   Incomplete  CULTURE, BLOOD (ROUTINE X 2)     Status: None   Collection Time    07/10/12  6:43 AM      Result Value Range Status   Specimen Description BLOOD RIGHT HAND   Final   Special Requests BOTTLES DRAWN AEROBIC ONLY 2 CC   Final   Culture  Setup Time 07/10/2012 10:36   Final   Culture     Final   Value:        BLOOD CULTURE RECEIVED NO GROWTH TO DATE CULTURE WILL BE HELD FOR 5 DAYS BEFORE ISSUING A FINAL NEGATIVE REPORT   Report Status PENDING   Incomplete  BODY FLUID CULTURE     Status: None   Collection Time    07/13/12  3:29 PM      Result Value Range Status   Specimen Description ASCITIC   Final   Special Requests Normal   Final   Gram Stain     Final   Value: RARE WBC PRESENT,BOTH PMN AND MONONUCLEAR     NO ORGANISMS SEEN   Culture PENDING   Incomplete   Report Status PENDING   Incomplete     Studies/Results: US Paracentesis  07/13/2012   *RADIOLOGY REPORT*  Clinical Data: History of cirrhosis, abdominal distension and pain, ascites.  Request for diagnostic and therapeutic paracentesis.  ULTRASOUND GUIDED PARACENTESIS  Comparison:  None  An ultrasound guided paracentesis was thoroughly discussed with the patient and questions answered.  The  benefits, risks, alternatives and complications were also discussed.  The patient understands and wishes to proceed with the procedure.  Written consent was obtained.  Ultrasound was performed to localize and mark an adequate pocket of fluid in the right  lateral quadrant of the abdomen.  The area was then prepped and draped in the normal sterile fashion.  1% Lidocaine was used for local anesthesia.  Under ultrasound guidance a 19 gauge Yueh catheter  was introduced.  Paracentesis was performed.  The catheter was removed and a dressing applied.  Complications:  None  Findings:  A total of approximately 5.6 liters of amber colored fluid was removed.  A fluid sample was sent for laboratory analysis.  IMPRESSION: Successful ultrasound guided paracentesis yielding 5.6 liters of ascites.  Read by Brayton El PA-C   Original Report Authenticated By: Jolaine Click, M.D.    Medications: Scheduled Meds: . antiseptic oral rinse  15 mL Mouth Rinse q12n4p  . chlorhexidine  15 mL Mouth Rinse BID  . fluconazole  100 mg Oral Daily  . furosemide  40 mg Intravenous Daily  . insulin aspart  0-9 Units Subcutaneous Q4H  . levalbuterol  0.63 mg Nebulization Q8H  . mineral oil  1 enema Rectal Once  . pantoprazole  40 mg Oral BID  . phytonadione  10 mg Subcutaneous Daily  . rOPINIRole  0.5 mg Oral BID  . simvastatin  20 mg Oral QHS  . sodium chloride  3 mL Intravenous Q12H   Continuous Infusions: . sodium chloride 10 mL/hr at 07/10/12 1900  . dextrose 5 % and 0.45% NaCl 50 mL/hr (07/11/12 2254)   PRN Meds:.acetaminophen, acetaminophen, levalbuterol, lip balm, ondansetron    LOS: 7 days   Jahmez Bily,CHRISTOPHER  [facility] Pager [phone]. If 8PM-8AM, please contact night-coverage at [email or web address], password [facility] 07/14/2012, 3:25 PM  LOS: 7 days

## 2012-07-15 DIAGNOSIS — Z515 Encounter for palliative care: Secondary | ICD-10-CM

## 2012-07-15 LAB — COMPREHENSIVE METABOLIC PANEL
AST: 36 U/L (ref 0–37)
Albumin: 2.3 g/dL — ABNORMAL LOW (ref 3.5–5.2)
BUN: 23 mg/dL (ref 6–23)
Calcium: 8.1 mg/dL — ABNORMAL LOW (ref 8.4–10.5)
Chloride: 102 mEq/L (ref 96–112)
Creatinine, Ser: 1.1 mg/dL (ref 0.50–1.10)
Total Bilirubin: 0.7 mg/dL (ref 0.3–1.2)
Total Protein: 5.4 g/dL — ABNORMAL LOW (ref 6.0–8.3)

## 2012-07-15 LAB — CBC
HCT: 30.4 % — ABNORMAL LOW (ref 36.0–46.0)
MCH: 26.3 pg (ref 26.0–34.0)
MCHC: 31.3 g/dL (ref 30.0–36.0)
MCV: 84.2 fL (ref 78.0–100.0)
Platelets: 88 10*3/uL — ABNORMAL LOW (ref 150–400)
RDW: 18.6 % — ABNORMAL HIGH (ref 11.5–15.5)
WBC: 7.3 10*3/uL (ref 4.0–10.5)

## 2012-07-15 LAB — GLUCOSE, CAPILLARY
Glucose-Capillary: 101 mg/dL — ABNORMAL HIGH (ref 70–99)
Glucose-Capillary: 82 mg/dL (ref 70–99)
Glucose-Capillary: 85 mg/dL (ref 70–99)
Glucose-Capillary: 96 mg/dL (ref 70–99)

## 2012-07-15 MED ORDER — INSULIN ASPART 100 UNIT/ML ~~LOC~~ SOLN: 0.0000 [IU] | Freq: Every day | SUBCUTANEOUS | Status: DC

## 2012-07-15 MED ORDER — WARFARIN SODIUM 5 MG PO TABS
5.0000 mg | ORAL_TABLET | Freq: Once | ORAL | Status: AC
  Administered 2012-07-15: 5 mg via ORAL
  Filled 2012-07-15: qty 1

## 2012-07-15 MED ORDER — INSULIN ASPART 100 UNIT/ML ~~LOC~~ SOLN: 0.0000 [IU] | Freq: Three times a day (TID) | SUBCUTANEOUS | Status: DC

## 2012-07-15 MED ORDER — FUROSEMIDE 40 MG PO TABS
40.0000 mg | ORAL_TABLET | Freq: Every day | ORAL | Status: DC
  Administered 2012-07-15 – 2012-07-16 (×2): 40 mg via ORAL
  Filled 2012-07-15 (×3): qty 1

## 2012-07-15 MED ORDER — HEPARIN SODIUM (PORCINE) 5000 UNIT/ML IJ SOLN
5000.0000 [IU] | Freq: Three times a day (TID) | INTRAMUSCULAR | Status: DC
  Administered 2012-07-15 – 2012-07-17 (×6): 5000 [IU] via SUBCUTANEOUS
  Filled 2012-07-15 (×9): qty 1

## 2012-07-15 MED ORDER — WARFARIN - PHARMACIST DOSING INPATIENT: Freq: Every day | Status: DC

## 2012-07-15 MED ORDER — POLYETHYLENE GLYCOL 3350 17 G PO PACK
17.0000 g | PACK | Freq: Every day | ORAL | Status: DC
  Administered 2012-07-17: 17 g via ORAL
  Filled 2012-07-15 (×3): qty 1

## 2012-07-15 MED ORDER — ALPRAZOLAM 0.25 MG PO TABS
0.2500 mg | ORAL_TABLET | Freq: Every evening | ORAL | Status: DC | PRN
  Administered 2012-07-15: 0.25 mg via ORAL
  Filled 2012-07-15: qty 1

## 2012-07-15 NOTE — Progress Notes (Signed)
Paracentesis site continues to leak. Clean dry pads applied. Will continue to monitor.

## 2012-07-15 NOTE — Progress Notes (Signed)
Received call from patient's son saying that after our conversation, he spoke with his mother in detail about the options I discussed with him. They have now decided that given her overall poor prognosis that she would like to go to her home (not [place]) to spend whatever time she has left with 24/7 caregivers and Hospice Care if possible-her son reassures me that resources are not an issue and that this is about his mom's wellbeing and happiness as she faces this decline and EOL.   Will place order for CM to offer Home Hospice Choice.  Anderson Malta, DO Palliative Medicine

## 2012-07-15 NOTE — Progress Notes (Signed)
ANTICOAGULATION CONSULT NOTE - Initial Consult  Pharmacy Consult for Warfarin Indication: Afib  Allergies  Allergen Reactions  . Amoxicillin-Pot Clavulanate     Diarrhea ,fatigue mouth sores   . Doxycycline Hyclate   . Iodinated Diagnostic Agents     Full body rash   . Nitrofuran Derivatives   . Norvasc (Amlodipine Besylate)     Leg swelling  . Plavix (Clopidogrel Bisulfate)     REACTION: SOB, and rash  . Sulfa Antibiotics     And adhesive tape  . Phenergan (Promethazine Hcl) Anxiety    Patient Measurements: Height: 5\' 5"  (165.1 cm) Weight: 248 lb 10.9 oz (112.8 kg) IBW/kg (Calculated) : 57  Vital Signs: Temp: 98.3 F (36.8 C) (06/07 0445) Temp src: Oral (06/07 0445) BP: 133/62 mmHg (06/07 0445) Pulse Rate: 93 (06/07 0445)  Labs:  Recent Labs  07/13/12 0331 07/14/12 0325 07/15/12 0530  HGB 9.7* 10.1* 9.5*  HCT 38.3 31.5* 30.4*  PLT 98* 76* 88*  LABPROT 34.8* 26.4* 17.3*  INR 3.74* 2.58* 1.46  CREATININE 1.28* 1.10 1.10    Estimated Creatinine Clearance: 52.8 ml/min (by C-G formula based on Cr of 1.1).   Medical History: Past Medical History  Diagnosis Date  . Myalgia and myositis, unspecified   . Esophageal reflux   . Degeneration of intervertebral disc, site unspecified   . Morbid obesity   . Osteoarthrosis, unspecified whether generalized or localized, unspecified site   . Rheumatoid arthritis(714.0)   . Type II or unspecified type diabetes mellitus without mention of complication, not stated as uncontrolled   . CAD (coronary artery disease)   . Chronic kidney disease   . Thrombocytopenia     Medications:  Prescriptions prior to admission  Medication Sig Dispense Refill  . aspirin EC 81 MG tablet Take 81 mg by mouth at bedtime.       . calcium-vitamin D (OSCAL WITH D) 500-200 MG-UNIT per tablet Take 1 tablet by mouth at bedtime.       . Coenzyme Q10 (COQ10) 100 MG CAPS Take 1 capsule by mouth 2 (two) times daily.      . furosemide (LASIX) 20  MG tablet Take 40 mg by mouth daily.       Marland Kitchen HYDROcodone-acetaminophen (LORTAB) 10-500 MG per tablet Take 1 tablet by mouth 2 (two) times daily as needed.      . hydroxychloroquine (PLAQUENIL) 200 MG tablet Take 200 mg by mouth 2 (two) times daily.       Marland Kitchen lisinopril (PRINIVIL,ZESTRIL) 10 MG tablet Take 5 mg by mouth daily.       . metoprolol (LOPRESSOR) 50 MG tablet Take 50 mg by mouth 2 (two) times daily.        . Multiple Vitamins-Minerals (CERTAVITE/ANTIOXIDANTS PO) Take 1 tablet by mouth daily.       . NON FORMULARY Ultimate eye support (6 berries) daily      . Omega-3 Fatty Acids (FISH OIL) 1000 MG CAPS Take 1 capsule by mouth 2 (two) times daily.       Marland Kitchen rOPINIRole (REQUIP) 0.5 MG tablet Take 0.5 mg by mouth 2 (two) times daily.        . simvastatin (ZOCOR) 20 MG tablet Take 20 mg by mouth at bedtime. 1 by mouth daily      . Vanadium 7.5 MG CAPS Take 7.5 mg by mouth daily. As directed      . warfarin (COUMADIN) 5 MG tablet 2.5-5 mg daily. Take 1 tab MWF, 1/2 tab all  others      . ACCU-CHEK AVIVA PLUS test strip       . Lancets (ACCU-CHEK MULTICLIX) lancets       . ondansetron (ZOFRAN) 4 MG tablet Take 4 mg by mouth as needed.        Scheduled:  . antiseptic oral rinse  15 mL Mouth Rinse q12n4p  . chlorhexidine  15 mL Mouth Rinse BID  . fluconazole  100 mg Oral Daily  . furosemide  40 mg Oral Daily  . heparin subcutaneous  5,000 Units Subcutaneous Q8H  . insulin aspart  0-9 Units Subcutaneous Q4H  . levalbuterol  0.63 mg Nebulization Q8H  . mineral oil  1 enema Rectal Once  . pantoprazole  40 mg Oral BID  . polyethylene glycol  17 g Oral Daily  . rOPINIRole  0.5 mg Oral BID  . simvastatin  20 mg Oral QHS  . sodium chloride  3 mL Intravenous Q12H  . spironolactone  50 mg Oral BID   Infusions:  . sodium chloride 10 mL/hr at 07/10/12 1900   PRN: acetaminophen, acetaminophen, levalbuterol, lip balm, ondansetron  Assessment: 80 YOF w/ hx Afib, on chronic Coumadin PTA for h/o  Afib. Admitted 5/29 w/ AKI and constipation. Coumadin was resumed on admission but d/c 6/2 due to positive gastric occult blood, now to resume coumadin  Dose PTA 5mg  MWF and 2.5mg  other days  Doses inpt = 5mg  on 5/30  INR 2.8 on admission, rose to 3.31 5/31 but began to decrease 6/1 (3.1). Was within tx range 6/2 (2.56) and became supratx 6/4-5 (3.48, 3.74) despite no coumadin being given, possibly due to liver disease and/or antibiotics. Pt rec'd Vit K 10mg  sq daily 6/5-6/6.  INR today below tx range (1.46).   Expect resistance to Coumadin given Vit K administration especially given the route of administration (SQ)  No bleeding reported  Pt on Fluconazole which may increase sensitivity to Coumadin  Goal of Therapy:  INR 2-3   Plan:  Coumadin 5mg  today Daily INR  Gwen Her PharmD  [phone] 07/15/2012 12:15 PM

## 2012-07-15 NOTE — Progress Notes (Signed)
[facility] Daily Rounding Note 07/15/2012, 10:33 AM  SUBJECTIVE:       Leaking from Paracentesis site. No belly pain.  No nausea on solids.  First good BM today.  No blood  OBJECTIVE:         Vital signs in last 24 hours:    Temp:  [98.2 F (36.8 C)-98.4 F (36.9 C)] 98.3 F (36.8 C) (06/07 0445) Pulse Rate:  [67-93] 93 (06/07 0445) Resp:  [19-23] 20 (06/07 0445) BP: (95-133)/(31-93) 133/62 mmHg (06/07 0445) SpO2:  [94 %-100 %] 97 % (06/07 0445) Weight:  [112.7 kg (248 lb 7.3 oz)-112.8 kg (248 lb 10.9 oz)] 112.8 kg (248 lb 10.9 oz) (06/07 0502) Last BM Date: 07/14/12 General: obese, looks significantly unwell   Heart: Irreg irreg Chest: clear in front.  notSOB Abdomen: obese, soft, not tender.  Bandage over paracentesis site with minor amount of ascitic clear fluid  Extremities: significant Bil pedal /LE edema and foot deformity B Neuro/Psych:  Pleasnt, alert, cooperative.  No confusion Heme:  Extensive purpura of UE.  Multiple telangectasia.  Intake/Output from previous day: 06/06 0701 - 06/07 0700 In: 350 [I.V.:350] Out: -   Intake/Output this shift:    Lab Results:  Recent Labs  07-22-2012 0331 07/14/12 0325 07/15/12 0530  WBC 7.0 6.5 7.3  HGB 9.7* 10.1* 9.5*  HCT 38.3 31.5* 30.4*  PLT 98* 76* 88*   BMET  Recent Labs  07/22/12 0331 07/14/12 0325 07/15/12 0530  NA 136 134* 135  K 3.7 3.7 3.9  CL 103 103 102  CO2 24 22 25   GLUCOSE 82 90 87  BUN 31* 27* 23  CREATININE 1.28* 1.10 1.10  CALCIUM 8.5 8.1* 8.1*   LFT  Recent Labs  07/14/12 0325 07/15/12 0530  PROT 5.3* 5.4*  ALBUMIN 2.3* 2.3*  AST 34 36  ALT 22 24  ALKPHOS 61 71  BILITOT 0.5 0.7   PT/INR  Recent Labs  07/14/12 0325 07/15/12 0530  LABPROT 26.4* 17.3*  INR 2.58* 1.46   Hepatitis Panel No results found for this basename: HEPBSAG, HCVAB, HEPAIGM, HEPBIGM,  in the last 72 hours  Studies/Results: US Paracentesis 07/22/2012     IMPRESSION: Successful ultrasound guided  paracentesis yielding 5.6 liters of ascites.  Read by Brayton El PA-C   Original Report Authenticated By: Jolaine Click, M.D.    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  Viral serologies negative.  5  Liter paracentesis 6/5.  transudative fluid.  No evidence of SBP On Aldactone 50 mg po BID, Lasix 40 mg IVdaily. * n/v.  reprts of limited CG material  *  Morbid obesity *  Diastolic heart failure.  *  Chronic kidney disease.  *  Normocytic anemia and thrombocytopenia, chronic/stable *  Chronic constipation. *  Acute on chronic hypoxic resp failure, wosened after Phenergen. Interstitial ling dz. Nocturnal CPAP *  UTI *  Oral thrush.  On diflucan. *  afib . supratherapeutic INR, corrected. Coumadin on hold.  *  Charcot foot, wheelchair bound.  Unable to transfer on her own out/into wheelchair.    PLAN: *  Would get goals of care consult before she discharges, pt has many serious and irreversable comorbidities.  She is agreeable to this *  Watch ascites leakage.  If excessive, could place ostomy bag to ease management of this *  Switch to oral Lasix.  Added daily Miralax.    LOS: 8 days   Holly [name]  07/15/2012, 10:33  AM Pager: [phone]  I have personally taken an interval history, reviewed the chart, and examined the patient.  I agree with the extender's note, impression and recommendations.  Barbette Hair. Arlyce Dice, MD, [facility] [phone]

## 2012-07-15 NOTE — Progress Notes (Addendum)
Palliative Medicine Team at [facility]  Date: 07/15/2012   Patient Name: Holly [name]  DOB: March 24, 1931  MRN: [id number]  Age / Sex: 79 y.o., female   PCP: [name], MD Referring Physician: Laveda Norman, MD  HPI/Reason for Consult:  Holly [name] is a 79 yo woman with multiple chronic end-stage medical problems including Cirrhosis from NASH vs. Congestive Hepatopathy, DM, Diastolic CHF, Interstitial Lung Disease, Morbid Obesity, multiple orthopedic issues and OSA who was admitted with worsening renal failure, dyspnea and nausea/vomiting. She is AOX3 and has full capacity for her own decision making. She has two sons who are out of town, she has a DIL who has been at her bedside and is a primary caregiver. She has recently moved to [place]/[facility] and has paid caregivers several times a day come in to help her- they get her OOB with a Hoyer lift in AM and also help her get back into bed at night, she also has all of her meals prepared. Holly [name] understands the extent of her serious medical conditions and has reluctantly accepted where she is and tells me she is ok with everything-she just misses being able to walk, misses her home-but she has been using paid caregivers for many years and overall her health has been declining for about the past 7 years.  I was able to speak at length with her son over the phone. I explained in detail how Hospice may be of benefit to his mother and also keep her out of a SNF-which is one of her major goals. Her son tells me that he has been a caregiver for ill and aging parents since he was 75 years old when his father got Prostate cancer,-after his father died about 14 years ago his mother started getting ill and has become progressively weaker, more incapacitated and unsafe to live in her home- even with in home caregivers. She recently moved in at [place] she is in the Independent living apartments-she has meals delivered and paid caregivers  coming in 3 times a day. She values her independence, she has done rehab several times in past and never found it very helpful. She is now bedbound-confined to a wheel chair, she remains mentally very sharp but has evidence of progressive debility and liver failure. During this hospitalization she had a large volume paracentesis (>5 Liters), she is quite edematous, with a low albumin, low platelets and inability to diurese due to acute on chronic renal failure. She wears CPAP at night, has thrush struggles with constipation.  Assessment/Summary of Goals:  1. DNR, patient has living will and also wants to be DNR so that her children don't have to make difficult decisions.  2. She adamantly refuses SNF. She wants to go to [place]/[facility] with her caregivers-I discussed the possibility of also having Hospice Services at ILF/ALF. She had a "bad" experience with hospice 14 years ago when her husband died, but she would like to get more information on possible services. Her son tell me that they in fact had a very good experience with Hospice.  I will have PMT RN Hospice Liaison meet with her on Monday to provide education on Hospice Benefit. Given her current condition and multiple co-morbidities/need for symptom management, she would likely qualify and has a prognosis of <6 months.  3. She denies pain-but tells me she got significant relief of SOB after the paracentesis. She has not slept well in the hospital. Will order very low dose  short acting bzd for sleep prn.  4. Predict need for future therapeutic paracentesis for comfort care. Fortunately she has not had encephalopathy from her Liver Disease.   Holly Malta, DO Palliative Medicine  ___________________________________________________________________ Social History:   reports that she quit smoking about 35 years ago. Her smoking use included Cigarettes. She has a 17 pack-year smoking history. She has never used smokeless tobacco. She  reports that she does not drink alcohol or use illicit drugs. Living Situation: Independent with paid caregivers, total assit with ADLs.  Family History: Family History  Problem Relation Age of Onset  . Rheum arthritis Mother     Active Medications:  Outpatient medications: Prescriptions prior to admission  Medication Sig Dispense Refill  . aspirin EC 81 MG tablet Take 81 mg by mouth at bedtime.       . calcium-vitamin D (OSCAL WITH D) 500-200 MG-UNIT per tablet Take 1 tablet by mouth at bedtime.       . Coenzyme Q10 (COQ10) 100 MG CAPS Take 1 capsule by mouth 2 (two) times daily.      . furosemide (LASIX) 20 MG tablet Take 40 mg by mouth daily.       Marland Kitchen HYDROcodone-acetaminophen (LORTAB) 10-500 MG per tablet Take 1 tablet by mouth 2 (two) times daily as needed.      . hydroxychloroquine (PLAQUENIL) 200 MG tablet Take 200 mg by mouth 2 (two) times daily.       Marland Kitchen lisinopril (PRINIVIL,ZESTRIL) 10 MG tablet Take 5 mg by mouth daily.       . metoprolol (LOPRESSOR) 50 MG tablet Take 50 mg by mouth 2 (two) times daily.        . Multiple Vitamins-Minerals (CERTAVITE/ANTIOXIDANTS PO) Take 1 tablet by mouth daily.       . NON FORMULARY Ultimate eye support (6 berries) daily      . Omega-3 Fatty Acids (FISH OIL) 1000 MG CAPS Take 1 capsule by mouth 2 (two) times daily.       Marland Kitchen rOPINIRole (REQUIP) 0.5 MG tablet Take 0.5 mg by mouth 2 (two) times daily.        . simvastatin (ZOCOR) 20 MG tablet Take 20 mg by mouth at bedtime. 1 by mouth daily      . Vanadium 7.5 MG CAPS Take 7.5 mg by mouth daily. As directed      . warfarin (COUMADIN) 5 MG tablet 2.5-5 mg daily. Take 1 tab MWF, 1/2 tab all others      . ACCU-CHEK AVIVA PLUS test strip       . Lancets (ACCU-CHEK MULTICLIX) lancets       . ondansetron (ZOFRAN) 4 MG tablet Take 4 mg by mouth as needed.         Current medications: Infusions: . sodium chloride 10 mL/hr at 07/10/12 1900    Scheduled Medications: . antiseptic oral rinse  15 mL  Mouth Rinse q12n4p  . chlorhexidine  15 mL Mouth Rinse BID  . fluconazole  100 mg Oral Daily  . furosemide  40 mg Oral Daily  . heparin subcutaneous  5,000 Units Subcutaneous Q8H  . insulin aspart  0-9 Units Subcutaneous Q4H  . levalbuterol  0.63 mg Nebulization Q8H  . mineral oil  1 enema Rectal Once  . pantoprazole  40 mg Oral BID  . polyethylene glycol  17 g Oral Daily  . rOPINIRole  0.5 mg Oral BID  . simvastatin  20 mg Oral QHS  . sodium chloride  3 mL  Intravenous Q12H  . spironolactone  50 mg Oral BID  . warfarin  5 mg Oral ONCE-1800  . Warfarin - Pharmacist Dosing Inpatient   Does not apply q1800    PRN Medications: acetaminophen, acetaminophen, ALPRAZolam, levalbuterol, lip balm, ondansetron   Vital Signs: BP 106/56  Pulse 92  Temp(Src) 97.8 F (36.6 C) (Oral)  Resp 18  Ht 5\' 5"  (1.651 m)  Wt 112.8 kg (248 lb 10.9 oz)  BMI 41.38 kg/m2  SpO2 97%   Physical Exam:  AOX3 morbidly obese. Abdomen soft, ascites leaking from right side. 2+edema UE and LE. Diffuse skin echymosis, joint deformities suggestive of RA. Ankle fusions Bil. Lungs with fixed crackles throughout-poor inspiration/atelectasis, hypoventilation.  Labs:  Basic or Comprehensive Metabolic Panel:    Component Value Date/Time   NA 135 07/15/2012 0530   K 3.9 07/15/2012 0530   CL 102 07/15/2012 0530   CO2 25 07/15/2012 0530   BUN 23 07/15/2012 0530   CREATININE 1.10 07/15/2012 0530   GLUCOSE 87 07/15/2012 0530   CALCIUM 8.1* 07/15/2012 0530   AST 36 07/15/2012 0530   ALT 24 07/15/2012 0530   ALKPHOS 71 07/15/2012 0530   BILITOT 0.7 07/15/2012 0530   PROT 5.4* 07/15/2012 0530   ALBUMIN 2.3* 07/15/2012 0530     CBC:    Component Value Date/Time   WBC 7.3 07/15/2012 0530   HGB 9.5* 07/15/2012 0530   HCT 30.4* 07/15/2012 0530   PLT 88* 07/15/2012 0530   MCV 84.2 07/15/2012 0530   NEUTROABS 5.5 07/07/2012 1038   LYMPHSABS 0.8 07/07/2012 1038   MONOABS 0.7 07/07/2012 1038   EOSABS 0.0 07/07/2012 1038   BASOSABS 0.0 07/07/2012 1038      BNP (last 3 results)  Recent Labs  07/07/12 1038 07/10/12 1025  PROBNP 21555.0* 28016.0*    CBG (last 3)   Recent Labs  07/15/12 0444 07/15/12 0800 07/15/12 1207  GLUCAP 82 85 101*    Imaging:  No results found.  Other Data:  (2D echo, EKG...)   Educational Materials Given:  DNR: Yes  MOST: No Healthcare Power-of-Attorney: Yes    Time: 70 minutes Greater than 50%  of this time was spent counseling and coordinating care related to the above assessment and plan.  Signed by: Edsel Petrin, DO  07/15/2012, 4:01 PM  Please contact Palliative Medicine Team phone at [phone] for questions and concerns.

## 2012-07-15 NOTE — Progress Notes (Addendum)
[facility] PROGRESS NOTE  Holly [name] ZOX:[id number] DOB: Feb 01, 1932 DOA: 07/07/2012 PCP: [name], MD  Assessment/Plan: Active Problems:   DIABETES MELLITUS, TYPE II   Morbid obesity   CORONARY ARTERY DISEASE   INTERSTITIAL LUNG DISEASE   Acute on chronic renal failure   Unspecified constipation   Cirrhosis of liver without mention of alcohol   Ascites    1. Nausea/Vomiting: Patient has a known history of chronic nausea with occasional vomiting. On 07/09/12, she developed acute nausea/vomiting, associated with coffee grounds, in the setting of Coumadin therapy. Etiology is suspected to be gastritis vs esophagitis. Managed with bowel rest, NG-T, antiemetics and PPI. Dr Lina Sar provided GI consultation. KUB revealed moderate gastric distention, and abdominal /pelvic CT scan showed no evidence of of obstruction. She may have underlying gastroparesis. Initial NG-T output was quite large, but as of 07/11/12, on GI recommendations, we were able to clamp, then discontinue NG-T, without deleterious effect. Patient was successfully advanced to regular diet on 07/12/12. Resolved.  2. Fecal Impaction/constipation:  Patient has been troubled by constipation, sometimes alternating with loose stools, and takes anti-diarrheals as needed. Suspect multifactorial (immobility, medications, maybe IBS), with superimposed acute constipation. KUB /CTscan negative for obstructive process. Managed with enemas' with satisfactory response. Now resolved.  3. Hepatic Cirrhosis/Ascites: CT scan demonstrated large volume abdominal/pelvic ascites and diffuse anasarca. Irregular contour of the liver suggesting cirrhosis. Differential-diagnostic considerations include NASH, vs cardiac cirrhosis. Hepatitis B & C serologies are negative. 2D Echocardiogram was done on 07/11/12 and revealed EF of 55% to 60%, diastolic flattening of interventricular septum, moderately dilated LA, moderately dilated RV, mildly  dilated RA, severe Tricuspid regurgitation. PA peak pressure: 90mm Hg (S), all consistent with pulmonary HTN and right heart failure. Diagnostic/Therapeutic paracentesis was done by IR on 07/13/12, with evacuation of 5.6 L fluid. Cell count is only 17 erasing concern for SBP. SAAG>1.1, consistent with portal hypertension. Meanwhile, low sodium diet and Lasix. Changed to PO today. .  4. Acute respiratory failure: Patient developed acute hypoxic respiratory failure on 07/10/12, following administration of Phenergan, for emesis. Dr Coralyn Helling provided pulmonary consultation, and has opined that this was multi-factorial, acute on chronic hypoxic resp failure in the setting of MO, OSA, AMS from sedation, lack of mandatory Cpap. Possible aspiration, is also a consideration. Patient apparently, also has ILD, under care of Dr Marcelyn Bruins, pulmonologist. CXR indicated mild CHF. Respitatory status has considerably improved with large volume paracentesis, utilization of nocturnal Bipap and diuresis. Now saturating at 96%-97% on 2L.  5. Chronic Diastolic Heart Failure: Patient has a known history of diastolic CHF. This may have been contributory to acute decompensation of respiratory status. See #4 above. Lasix temporarily placed on hold, due to ARF. Lasix was recommenced on 07/12/12, and patient appears to be tolerating this.  6. Acute on chronic Renal Failure: At presentation, creatinine was 2.45. Probably pre renal (ATN) in setting of poor oral intake, diuretic, ACE and UTI. Managed with IV fluids, ACE-i placed on hold (baseline CR About 1.6). Urine output has been good, and creatinine has trended down, and as of 07/14/12, has normalized at 1.10.   7. UTI: UA with positive nitrates, WBC 21 to 50. Urine culture grew pan-sensitive E. Coli. Managed with iv Rocephin. Antibiotics were concluded on 07/13/12, after 7 days of therapy.  8. Diabetes: This is type-2 and CBGs have remained satisfactory on SSI. HBA1C is 6.  9. A fib:  Rate-controlled on Metoprolol. Coumadin was briefly placed on hold, due to  supra-therapeutic INR. Vit K was administered on 07/13/12-07/14/12.   10. Morbid Obesity/OSA: On nocturnal CPAP.  11. RA: Continued on Plaquenil. Not problematic. 12. Thrush; Patient  Was found to have oral thrush incidentally, on physical examination. Initially managed with Nystatin swish. Changed to Diflucan on 07/10/12, now day #6.  13. Thrombocytopenia: Chronic, stable. Following CBC.    Code Status: Full Code.  Family Communication: Family fully updated at bedside.  Disposition Plan: To be determined. Given multiple irreversible co-morbidities, have consulted PMT for GOC.    Brief narrative: 79 yo female, with known RA, Morbid obesity, DM, Diastolic Heart Failure, CAD, A fib on coumadin, CKD, OSA, Cpap dependent.who was referred by PCP to the ED for evaluation of worsening renal function. Patient saw her PCP day prior to admission, because she was having problems with constipation and decreased urine output. She did develop brownish emesis 07/10/12 and was treated with Phenergan for nausea/vomiting. She was moved to the ICU afterwards, for AMS following Phenergan and was found to be hypoxic by ABG, consistent with hypoventilation. PCCM was consulted to assess acute on chronic respiratory failure.   Consultants:  Dr Coralyn Helling, PCCM.  Dr Lina Sar, GI.  Procedures:  See Notes.   Antibiotics:  Rocephin 07/07/12-07/13/12.   Vancomycin 07/09/12-6//3/14.   Diflucan 07/10/12>>>  HPI/Subjective: No new issues.   Objective: Vital signs in last 24 hours: Temp:  [98.2 F (36.8 C)-98.4 F (36.9 C)] 98.3 F (36.8 C) (06/07 0445) Pulse Rate:  [67-93] 93 (06/07 0445) Resp:  [19-23] 20 (06/07 0445) BP: (95-133)/(31-93) 133/62 mmHg (06/07 0445) SpO2:  [94 %-100 %] 97 % (06/07 0445) Weight:  [112.7 kg (248 lb 7.3 oz)-112.8 kg (248 lb 10.9 oz)] 112.8 kg (248 lb 10.9 oz) (06/07 0502) Weight change: -0.1 kg (-3.5  oz) Last BM Date: 07/14/12  Intake/Output from previous day: 06/06 0701 - 06/07 0700 In: 350 [I.V.:350] Out: -      Physical Exam: General: Comfortable, alert, communicative, fully oriented, not short of breath at rest.  HEENT:  Mild clinical pallor, no jaundice, no conjunctival injection or discharge. NECK:  Supple, JVP not seen, no carotid bruits, no palpable lymphadenopathy, no palpable goiter. CHEST:  Clinically clear to auscultation, no wheezes, no crackles. HEART:  Sounds 1 and 2 heard, normal, irregular, no murmurs. ABDOMEN:  Morbidly obese, soft, non-tender, no palpable organomegaly, no palpable masses, normal bowel sounds. Dressings at site of paracentesis are wet.  GENITALIA:  Not examined. LOWER EXTREMITIES:  Moderate pitting edema, palpable peripheral pulses. MUSCULOSKELETAL SYSTEM:  Generalized osteoarthritic changes, otherwise, normal. CENTRAL NERVOUS SYSTEM:  No focal neurologic deficit on gross examination.  Lab Results:  Recent Labs  07/14/12 0325 07/15/12 0530  WBC 6.5 7.3  HGB 10.1* 9.5*  HCT 31.5* 30.4*  PLT 76* 88*    Recent Labs  07/14/12 0325 07/15/12 0530  NA 134* 135  K 3.7 3.9  CL 103 102  CO2 22 25  GLUCOSE 90 87  BUN 27* 23  CREATININE 1.10 1.10  CALCIUM 8.1* 8.1*   Recent Results (from the past 240 hour(s))  URINE CULTURE     Status: None   Collection Time    07/07/12 12:27 PM      Result Value Range Status   Specimen Description URINE, RANDOM   Final   Special Requests NONE   Final   Culture  Setup Time 07/07/2012 23:01   Final   Colony Count >=100,000 COLONIES/ML   Final   Culture ESCHERICHIA COLI  Final   Report Status 07/09/2012 FINAL   Final   Organism ID, Bacteria ESCHERICHIA COLI   Final  MRSA PCR SCREENING     Status: None   Collection Time    07/10/12  5:33 AM      Result Value Range Status   MRSA by PCR NEGATIVE  NEGATIVE Final   Comment:            The GeneXpert MRSA Assay (FDA     approved for NASAL specimens      only), is one component of a     comprehensive MRSA colonization     surveillance program. It is not     intended to diagnose MRSA     infection nor to guide or     monitor treatment for     MRSA infections.  CULTURE, BLOOD (ROUTINE X 2)     Status: None   Collection Time    07/10/12  6:40 AM      Result Value Range Status   Specimen Description BLOOD LEFT ARM   Final   Special Requests BOTTLES DRAWN AEROBIC AND ANAEROBIC 5 CC EACH   Final   Culture  Setup Time 07/10/2012 10:36   Final   Culture     Final   Value:        BLOOD CULTURE RECEIVED NO GROWTH TO DATE CULTURE WILL BE HELD FOR 5 DAYS BEFORE ISSUING A FINAL NEGATIVE REPORT   Report Status PENDING   Incomplete  CULTURE, BLOOD (ROUTINE X 2)     Status: None   Collection Time    07/10/12  6:43 AM      Result Value Range Status   Specimen Description BLOOD RIGHT HAND   Final   Special Requests BOTTLES DRAWN AEROBIC ONLY 2 CC   Final   Culture  Setup Time 07/10/2012 10:36   Final   Culture     Final   Value:        BLOOD CULTURE RECEIVED NO GROWTH TO DATE CULTURE WILL BE HELD FOR 5 DAYS BEFORE ISSUING A FINAL NEGATIVE REPORT   Report Status PENDING   Incomplete  BODY FLUID CULTURE     Status: None   Collection Time    07/13/12  3:29 PM      Result Value Range Status   Specimen Description ASCITIC   Final   Special Requests Normal   Final   Gram Stain     Final   Value: RARE WBC PRESENT,BOTH PMN AND MONONUCLEAR     NO ORGANISMS SEEN   Culture PENDING   Incomplete   Report Status PENDING   Incomplete     Studies/Results: US Paracentesis  07/13/2012   *RADIOLOGY REPORT*  Clinical Data: History of cirrhosis, abdominal distension and pain, ascites.  Request for diagnostic and therapeutic paracentesis.  ULTRASOUND GUIDED PARACENTESIS  Comparison:  None  An ultrasound guided paracentesis was thoroughly discussed with the patient and questions answered.  The benefits, risks, alternatives and complications were also discussed.   The patient understands and wishes to proceed with the procedure.  Written consent was obtained.  Ultrasound was performed to localize and mark an adequate pocket of fluid in the right  lateral quadrant of the abdomen.  The area was then prepped and draped in the normal sterile fashion.  1% Lidocaine was used for local anesthesia.  Under ultrasound guidance a 19 gauge Yueh catheter was introduced.  Paracentesis was performed.  The catheter was removed and a  dressing applied.  Complications:  None  Findings:  A total of approximately 5.6 liters of amber colored fluid was removed.  A fluid sample was sent for laboratory analysis.  IMPRESSION: Successful ultrasound guided paracentesis yielding 5.6 liters of ascites.  Read by Brayton El PA-C   Original Report Authenticated By: Jolaine Click, M.D.    Medications: Scheduled Meds: . antiseptic oral rinse  15 mL Mouth Rinse q12n4p  . chlorhexidine  15 mL Mouth Rinse BID  . fluconazole  100 mg Oral Daily  . furosemide  40 mg Oral Daily  . insulin aspart  0-9 Units Subcutaneous Q4H  . levalbuterol  0.63 mg Nebulization Q8H  . mineral oil  1 enema Rectal Once  . pantoprazole  40 mg Oral BID  . polyethylene glycol  17 g Oral Daily  . rOPINIRole  0.5 mg Oral BID  . simvastatin  20 mg Oral QHS  . sodium chloride  3 mL Intravenous Q12H  . spironolactone  50 mg Oral BID   Continuous Infusions: . sodium chloride 10 mL/hr at 07/10/12 1900   PRN Meds:.acetaminophen, acetaminophen, levalbuterol, lip balm, ondansetron    LOS: 8 days   Lashona Schaaf,CHRISTOPHER  [facility] Pager [phone]. If 8PM-8AM, please contact night-coverage at [email or web address], password [facility] 07/15/2012, 11:38 AM  LOS: 8 days

## 2012-07-16 LAB — CBC
MCH: 27 pg (ref 26.0–34.0)
MCV: 83.7 fL (ref 78.0–100.0)
Platelets: 99 10*3/uL — ABNORMAL LOW (ref 150–400)
RBC: 3.63 MIL/uL — ABNORMAL LOW (ref 3.87–5.11)
RDW: 18.8 % — ABNORMAL HIGH (ref 11.5–15.5)
WBC: 7.6 10*3/uL (ref 4.0–10.5)

## 2012-07-16 LAB — CULTURE, BLOOD (ROUTINE X 2)

## 2012-07-16 LAB — COMPREHENSIVE METABOLIC PANEL
ALT: 24 U/L (ref 0–35)
AST: 37 U/L (ref 0–37)
Albumin: 2.4 g/dL — ABNORMAL LOW (ref 3.5–5.2)
CO2: 24 mEq/L (ref 19–32)
Calcium: 8.3 mg/dL — ABNORMAL LOW (ref 8.4–10.5)
Chloride: 103 mEq/L (ref 96–112)
Creatinine, Ser: 1.12 mg/dL — ABNORMAL HIGH (ref 0.50–1.10)
Sodium: 136 mEq/L (ref 135–145)

## 2012-07-16 LAB — GLUCOSE, CAPILLARY
Glucose-Capillary: 94 mg/dL (ref 70–99)
Glucose-Capillary: 98 mg/dL (ref 70–99)
Glucose-Capillary: 119 mg/dL — ABNORMAL HIGH (ref 70–99)

## 2012-07-16 LAB — PROTIME-INR: Prothrombin Time: 16.2 seconds — ABNORMAL HIGH (ref 11.6–15.2)

## 2012-07-16 MED ORDER — HYDROXYZINE HCL 10 MG PO TABS
10.0000 mg | ORAL_TABLET | Freq: Four times a day (QID) | ORAL | Status: DC | PRN
  Administered 2012-07-16: 10 mg via ORAL
  Filled 2012-07-16: qty 1

## 2012-07-16 MED ORDER — CAMPHOR-MENTHOL 0.5-0.5 % EX LOTN
TOPICAL_LOTION | Freq: Two times a day (BID) | CUTANEOUS | Status: DC
  Administered 2012-07-16 – 2012-07-17 (×3): via TOPICAL
  Filled 2012-07-16: qty 222

## 2012-07-16 MED ORDER — WARFARIN SODIUM 5 MG PO TABS
5.0000 mg | ORAL_TABLET | Freq: Once | ORAL | Status: AC
  Administered 2012-07-16: 5 mg via ORAL
  Filled 2012-07-16: qty 1

## 2012-07-16 NOTE — Progress Notes (Signed)
Nutrition Brief Note  Patient identified on the Malnutrition Screening Tool (MST) Report  Body mass index is 41.38 kg/(m^2). Patient meets criteria for morbid obesity based on current BMI.   Current diet order is carb modified, patient is consuming approximately 90% of meals at this time. Labs and medications reviewed.   Per nurse and pt, pt is eating well and has had no significant weight loss other than fluid pulled off by paracentesis. Pt is going home with hospice.  No nutrition interventions warranted at this time. If nutrition issues arise, please consult RD.   Ebbie Latus RD, LDN

## 2012-07-16 NOTE — Progress Notes (Signed)
Occupational Therapy Discharge Patient Details Name: Holly [name] MRN: [id number] DOB: [birth year] Today's Date: 07/16/2012 Time:  -     Patient discharged from OT services secondary to pt was total care for all BADLs and mobility pta. No acute OT needs identified, will sign off.Evette Georges [phone] 07/16/2012, 11:56 AM

## 2012-07-16 NOTE — Discharge Summary (Signed)
Physician Discharge Summary  Holly [name] ZOX:[id number] DOB: [birth year] DOA: 07/07/2012  PCP: [name], MD  Admit date: 07/07/2012 Discharge date: 07/17/2012  Time spent: 40 minutes  Recommendations for Outpatient Follow-up:  1. Follow up with primary MD.  2. Home Hospice.   Comment: Dr Julaine Fusi, Palliative Care Medicine, has discussed at length with patient and family. She has a multitude of potential qualifying diagnoses for hospice care. Her most life limiting issues are probably her Liver Failure/Congestive hepatopathy and Pulmonary Disease associated with severe Right Heart Failure/Pulmonary HTN. PPS: 40%, disabling dyspnea with minimal exertion, on home O2 at 3L. Will go home with 24/7 paid in home caregivers and Hospice referral.   Discharge Diagnoses:  Active Problems:   DIABETES MELLITUS, TYPE II   Morbid obesity   CORONARY ARTERY DISEASE   INTERSTITIAL LUNG DISEASE   Acute on chronic renal failure   Unspecified constipation   Cirrhosis of liver without mention of alcohol   Ascites   Discharge Condition: Satisfactory.   Diet recommendation: 2g Sodium diet.   Filed Weights   07/17/12 0500 07/17/12 0531 07/17/12 0555  Weight: 110.2 kg (242 lb 15.2 oz) 110.2 kg (242 lb 15.2 oz) 109.77 kg (242 lb)    History of present illness:  79 yo female, with known RA, Morbid obesity, DM, Diastolic Heart Failure, CAD, A. fib on coumadin, CKD, OSA, Cpap dependent.who was referred by PCP to the ED for evaluation of worsening renal function. Patient saw her PCP day prior to admission, because she was having problems with constipation and decreased urine output. She did develop brownish emesis 07/10/12 and was treated with Phenergan for nausea/vomiting. She was moved to the ICU afterwards, for AMS following Phenergan and was found to be hypoxic by ABG, consistent with hypoventilation. PCCM was consulted to assess acute on chronic respiratory failure.   Hospital Course:   1. Nausea/Vomiting: Patient has a known history of chronic nausea with occasional vomiting. On 07/09/12, she developed acute nausea/vomiting, associated with coffee grounds, in the setting of Coumadin therapy. Etiology is suspected to be gastritis vs esophagitis. Managed with bowel rest, NG-T, antiemetics and PPI. Dr Lina Sar provided initial GI consultation, and patient was subsequently followed by Dr Melvia Heaps. KUB revealed moderate gastric distention, and abdominal/pelvic CT scan showed no evidence of of obstruction. She may have underlying gastroparesis. Initial NG-T output was quite large, but as of 07/11/12, on GI recommendations, we were able to clamp, then discontinue NG-T, without deleterious effect. Patient was successfully advanced to regular diet on 07/12/12. Resolved.  2. Fecal Impaction/constipation: Patient has been troubled by constipation, sometimes alternating with loose stools, and takes anti-diarrheals as needed. Suspect multifactorial etiology (immobility, medications, maybe IBS), with superimposed acute constipation. KUB/CTscan negative for obstructive process. Managed with enemas, with satisfactory response. Now resolved.  3. Hepatic Cirrhosis/Ascites: CT scan demonstrated large volume abdominal/pelvic ascites and diffuse anasarca. Irregular contour of the liver suggesting cirrhosis. Differential-diagnostic considerations include NASH, vs cardiac cirrhosis. Hepatitis B & C serologies are negative. 2D Echocardiogram was done on 07/11/12 and revealed EF of 55% to 60%, diastolic flattening of interventricular septum, moderately dilated LA, moderately dilated RV, mildly dilated RA, severe Tricuspid regurgitation. PA peak pressure: 90mm Hg (S), all consistent with pulmonary HTN and right heart failure. Diagnostic/Therapeutic paracentesis was done by IR on 07/13/12, with evacuation of 5.6 L fluid. Cell count is only 17 erasing concern for SBP. SAAG>1.1, consistent with portal hypertension. Managed  with low sodium diet and Lasix. Changed to PO  diuretics on 07/15/12. Aldactone added 07/17/12. Stable.  4. Acute respiratory failure: Patient developed acute hypoxic respiratory failure on 07/10/12, following administration of Phenergan, for emesis. Dr Coralyn Helling provided pulmonary consultation, and has opined that this was multi-factorial, acute on chronic hypoxic resp failure in the setting of morbid obesity, OSA, AMS from sedation, and lack of mandatory Cpap. Possible aspiration, was also a consideration. Patient apparently, also has ILD, under care of Dr Marcelyn Bruins, pulmonologist. CXR indicated mild CHF. Respitatory status dramatically improved with large volume paracentesis, utilization of nocturnal Bipap and diuresis. Now saturating at 96%-97% on 2L.  5. Chronic Diastolic Heart Failure: Patient has a known history of diastolic CHF. This may have been contributory to acute decompensation of respiratory status. See #4 above. Lasix was temporarily placed on hold, due to ARF, but was recommenced on 07/12/12, and was well tolerated. Otherwise stable, at this time.  6. Acute on chronic Renal Failure: At presentation, creatinine was 2.45 (Known baseline creatinine is 1.6). Probably pre renal (ATN) in setting of poor oral intake, diuretic, ACE and UTI. Managed with IV fluids and ACE-i was discontinued. Urine output remained good, and creatinine trended down nicely. As of 07/14/12, creatinine had normalized at 1.10.  7. UTI: U/A demonstrated positive nitrates, WBC 21 to 50. Urine culture grew pan-sensitive E. Coli. Managed with iv Rocephin. Antibiotics were concluded on 07/13/12, after 7 days of therapy.  8. Diabetes: This is type-2 and CBGs have remained satisfactory during hospitalization, on SSI. HBA1C is 6.  9. A fib: Rate-controlled on Metoprolol. Coumadin was briefly placed on hold, due to supra-therapeutic INR. Vit K was administered on 07/13/12-07/14/12. Now back on Coumadin. Telemetric monitoring discontinued on  07/16/12.  10. Morbid Obesity/OSA: On nocturnal CPAP.  11. RA: Continued on Plaquenil. Not problematic.  12. Thrush: Patient Was found to have oral thrush incidentally, on physical examination. Initially managed with Nystatin swish. Changed to Diflucan on 07/10/12, and treatment concluded on 07/16/12. Oral thrush has resolved.  13. Thrombocytopenia: Chronic, stable. Platelet count was 99K on 07/16/12.    Procedures:  See Below.   Paracentesis 06/12/12.   Consultations: Dr Coralyn Helling, PCCM.  Dr Lina Sar, GI.  Dr Julaine Fusi, PMT.    Discharge Exam: Filed Vitals:   07/17/12 0500 07/17/12 0531 07/17/12 0555 07/17/12 0847  BP:   120/67   Pulse:   96   Temp:   97.7 F (36.5 C)   TempSrc:   Oral   Resp:   20   Height:      Weight: 110.2 kg (242 lb 15.2 oz) 110.2 kg (242 lb 15.2 oz) 109.77 kg (242 lb)   SpO2:   95% 94%    General: Comfortable, alert, communicative, fully oriented, not short of breath at rest.  HEENT: Mild clinical pallor, no jaundice, no conjunctival injection or discharge.  NECK: Supple, JVP not seen, no carotid bruits, no palpable lymphadenopathy, no palpable goiter.  CHEST: Clinically clear to auscultation, no wheezes, no crackles.  HEART: Sounds 1 and 2 heard, normal, irregular, no murmurs.  ABDOMEN: Morbidly obese, soft, non-tender, no palpable organomegaly, no palpable masses, normal bowel sounds.  GENITALIA: Not examined.  LOWER EXTREMITIES: Moderate pitting edema, palpable peripheral pulses.  MUSCULOSKELETAL SYSTEM: Generalized osteoarthritic changes, otherwise, normal.  CENTRAL NERVOUS SYSTEM: No focal neurologic deficit on gross examination.  Discharge Instructions      Discharge Orders   Future Appointments Provider Department Dept Phone   12/20/2012 2:15 PM Barbaraann Share, MD [facility] [phone]  Future Orders Complete By Expires     Diet - low sodium heart healthy  As directed     Diet Carb Modified  As directed     Increase  activity slowly  As directed         Medication List    STOP taking these medications       lisinopril 10 MG tablet  Commonly known as:  PRINIVIL,ZESTRIL     metoprolol 50 MG tablet  Commonly known as:  LOPRESSOR     ondansetron 4 MG tablet  Commonly known as:  ZOFRAN     Vanadium 7.5 MG Caps      TAKE these medications       ACCU-CHEK AVIVA PLUS test strip  Generic drug:  glucose blood     accu-chek multiclix lancets     ALPRAZolam 0.25 MG tablet  Commonly known as:  XANAX  Take 1 tablet (0.25 mg total) by mouth at bedtime as needed for anxiety or sleep.     aspirin EC 81 MG tablet  Take 81 mg by mouth at bedtime.     calcium-vitamin D 500-200 MG-UNIT per tablet  Commonly known as:  OSCAL WITH D  Take 1 tablet by mouth at bedtime.     camphor-menthol lotion  Commonly known as:  SARNA  Apply topically 2 (two) times daily. Apply to itchy areas of skin.     CERTAVITE/ANTIOXIDANTS PO  Take 1 tablet by mouth daily.     CoQ10 100 MG Caps  Take 1 capsule by mouth 2 (two) times daily.     Fish Oil 1000 MG Caps  Take 1 capsule by mouth 2 (two) times daily.     furosemide 20 MG tablet  Commonly known as:  LASIX  Take 3 tablets (60 mg total) by mouth daily.     HYDROcodone-acetaminophen 10-500 MG per tablet  Commonly known as:  LORTAB  Take 1 tablet by mouth 2 (two) times daily as needed.     hydroxychloroquine 200 MG tablet  Commonly known as:  PLAQUENIL  Take 200 mg by mouth 2 (two) times daily.     hydrOXYzine 10 MG tablet  Commonly known as:  ATARAX/VISTARIL  Take 1 tablet (10 mg total) by mouth every 6 (six) hours as needed for itching.     levalbuterol 0.63 MG/3ML nebulizer solution  Commonly known as:  XOPENEX  Take 3 mLs (0.63 mg total) by nebulization every 4 (four) hours as needed for wheezing or shortness of breath.     NON FORMULARY  Ultimate eye support (6 berries) daily     pantoprazole 40 MG tablet  Commonly known as:  PROTONIX  Take 1  tablet (40 mg total) by mouth 2 (two) times daily.     polyethylene glycol packet  Commonly known as:  MIRALAX / GLYCOLAX  Take 17 g by mouth daily.     rOPINIRole 0.5 MG tablet  Commonly known as:  REQUIP  Take 0.5 mg by mouth 2 (two) times daily.     simvastatin 20 MG tablet  Commonly known as:  ZOCOR  Take 20 mg by mouth at bedtime. 1 by mouth daily     spironolactone 50 MG tablet  Commonly known as:  ALDACTONE  Take 1 tablet (50 mg total) by mouth 2 (two) times daily.     warfarin 5 MG tablet  Commonly known as:  COUMADIN  2.5-5 mg daily. Take 1 tab MWF, 1/2 tab all others  Allergies  Allergen Reactions  . Amoxicillin-Pot Clavulanate     Diarrhea ,fatigue mouth sores   . Doxycycline Hyclate   . Iodinated Diagnostic Agents     Full body rash   . Nitrofuran Derivatives   . Norvasc (Amlodipine Besylate)     Leg swelling  . Plavix (Clopidogrel Bisulfate)     REACTION: SOB, and rash  . Sulfa Antibiotics     And adhesive tape  . Phenergan (Promethazine Hcl) Anxiety   Follow-up Information   Follow up with [name], MD.   Contact information:   [address] [place] [address] [phone]       Please follow up. [facility] has been arranged. )        The results of significant diagnostics from this hospitalization (including imaging, microbiology, ancillary and laboratory) are listed below for reference.    Significant Diagnostic Studies: Ct Abdomen Pelvis Wo Contrast  07/10/2012   *RADIOLOGY REPORT*  Clinical Data: Abdominal pain.  CT ABDOMEN AND PELVIS WITHOUT CONTRAST  Technique:  Multidetector CT imaging of the abdomen and pelvis was performed following the standard protocol without intravenous contrast.  Comparison: None.  Findings: The lung bases demonstrate small bilateral pleural effusions and bibasilar atelectasis.  The heart is enlarged and there are extensive dense coronary artery calcifications.  There is an NG tube  coursing down the esophagus and into the stomach.  The tip is in the second portion of the duodenum.  The liver contour is irregular suggesting cirrhosis.  There is extensive abdominal and pelvic ascites along with diffuse anasarca. No focal hepatic lesions or intrahepatic biliary dilatation.  No common bile duct dilatation.  The gallbladder appears normal.  The spleen is normal in size.  The pancreas is grossly normal.  Both adrenal glands are slightly enlarged but no discrete mass.  The kidneys are grossly normal.  No renal calculi or hydronephrosis.  The stomach, duodenum, small bowel and colon are grossly normal without oral contrast.  No findings for obstruction and/or perforation.  Extensive vascular calcifications without definite aneurysm.  No mesenteric or retroperitoneal mass or adenopathy.  There is a Foley catheter in the bladder.  The uterus and ovaries are grossly normal.  Large volume pelvic ascites.  No mass or adenopathy.  There are enlarged inguinal lymph nodes bilaterally.  The bony structures are grossly intact.  Advanced osteoporosis and degenerative changes.  IMPRESSION:  1.  Large volume abdominal/pelvic ascites and diffuse anasarca. 2.  Irregular contour of the liver suggesting cirrhosis. 3.  Advanced atherosclerotic calcifications involving the major vascular structures. 4.  The NG tube tip is in the second portion of the duodenum.   Original Report Authenticated By: Rudie Meyer, M.D.   Dg Abd 1 View  07/10/2012   *RADIOLOGY REPORT*  Clinical Data: Refractory nausea and vomiting.  ABDOMEN - 1 VIEW  Comparison: Abdominal radiograph performed 07/07/2012  Findings: The visualized bowel gas pattern is nonspecific.  There is moderate distension of the stomach with air.  The colon is partially filled with stool and air and is grossly unremarkable in appearance.  No abnormal dilatation of small bowel loops is seen to suggest small bowel obstruction.  No free intra-abdominal air is identified,  though evaluation for free air is limited on supine views.  Degenerative change is noted at the lower lumbar spine; the sacroiliac joints are unremarkable in appearance.  The lung bases are not well characterized due to motion artifact.  IMPRESSION: Nonspecific bowel gas pattern.  Moderate  distension of the stomach with air.  This could reflect gastroparesis.  Air noted within small and large bowel loops, suggesting against gastric outlet obstruction.  No free intra-abdominal air seen.   Original Report Authenticated By: Tonia Ghent, M.D.   US Renal  07/07/2012   *RADIOLOGY REPORT*  Clinical Data: Renal failure.  RENAL/URINARY TRACT ULTRASOUND COMPLETE  Comparison:  Urinary tract ultrasound 02/06/2009.  Findings:  Right Kidney:  No hydronephrosis.  Well-preserved cortex.  No shadowing calculi.  Normal size and parenchymal echotexture without focal abnormalities.  Approximately 10.6 cm in length.  Left Kidney:  No hydronephrosis.  Well-preserved cortex.  No shadowing calculi.  Normal size and parenchymal echotexture without focal abnormalities, though the lower pole is obscured by shadowing bowel gas from the left colon.  Approximately 9.9 cm in length, best estimate.  Bladder:  Decompressed.  Other findings:  Moderate ascites in the pelvis and small amount of ascites in the right upper quadrant.  IMPRESSION:  1.  No evidence of hydronephrosis involving either kidney. 2.  No focal renal parenchymal abnormalities with a caveat that the lower pole of the left kidney was obscured by bowel gas and was not evaluated. 3.  Moderate ascites.   Original Report Authenticated By: Hulan Saas, M.D.   US Paracentesis  07/13/2012   *RADIOLOGY REPORT*  Clinical Data: History of cirrhosis, abdominal distension and pain, ascites.  Request for diagnostic and therapeutic paracentesis.  ULTRASOUND GUIDED PARACENTESIS  Comparison:  None  An ultrasound guided paracentesis was thoroughly discussed with the patient and questions  answered.  The benefits, risks, alternatives and complications were also discussed.  The patient understands and wishes to proceed with the procedure.  Written consent was obtained.  Ultrasound was performed to localize and mark an adequate pocket of fluid in the right  lateral quadrant of the abdomen.  The area was then prepped and draped in the normal sterile fashion.  1% Lidocaine was used for local anesthesia.  Under ultrasound guidance a 19 gauge Yueh catheter was introduced.  Paracentesis was performed.  The catheter was removed and a dressing applied.  Complications:  None  Findings:  A total of approximately 5.6 liters of amber colored fluid was removed.  A fluid sample was sent for laboratory analysis.  IMPRESSION: Successful ultrasound guided paracentesis yielding 5.6 liters of ascites.  Read by Brayton El PA-C   Original Report Authenticated By: Jolaine Click, M.D.   Dg Chest Port 1 View  07/12/2012   *RADIOLOGY REPORT*  Clinical Data: Pulmonary edema  PORTABLE CHEST - 1 VIEW  Comparison: Prior chest x-ray 07/11/2012  Findings: Slightly decreased interstitial edema.  Persistent cardiomegaly with pulmonary vascular congestion.  The main central pulmonary arteries are enlarged.  Inspiratory volumes are low and there is bibasilar atelectasis.  IMPRESSION:  1.  Slightly improved interstitial edema 2.  Pulmonary arterial enlargements suggest underlying pulmonary arterial hypertension 3.  Stable cardiomegaly and pulmonary vascular congestion   Original Report Authenticated By: Malachy Moan, M.D.   Dg Chest Port 1 View  07/11/2012   *RADIOLOGY REPORT*  Clinical Data: Shortness of breath, congestive heart failure  PORTABLE CHEST - 1 VIEW  Comparison: Portable chest x-ray of 07/10/2012  Findings: Aeration has improved slightly.  There is still evidence of pulmonary vascular congestion with probable bilateral pleural effusions.  Cardiomegaly is stable.  An NG tube tip seen only to the lower esophagus, not  visualized below the hemidiaphragm on the current film.  IMPRESSION: Slightly better aeration.  Persistent mild CHF.  Original Report Authenticated By: Dwyane Dee, M.D.   Dg Chest Port 1 View  07/10/2012   *RADIOLOGY REPORT*  Clinical Data: Respiratory distress.  PORTABLE CHEST - 1 VIEW  Comparison: Chest radiograph performed 07/09/2012  Findings: The lungs are hypoexpanded.  Vascular congestion is seen. Bilateral airspace opacification is noted, left greater than right, similar appearance to the prior study.  This likely reflects asymmetric interstitial edema, though pneumonia might have a similar appearance.  A small left pleural effusion is suspected. No pneumothorax is seen.  The cardiomediastinal silhouette is mildly enlarged.  No acute osseous abnormalities are identified.  The patient's enteric tube is noted extending below the diaphragm.  IMPRESSION:  1.  Lungs hypoexpanded.  Bilateral airspace opacification, left greater than right, similar in appearance to the prior study.  This likely reflects asymmetric interstitial edema, though pneumonia might have a similar appearance.  Suspect small left pleural effusion. 2.  Vascular congestion and mild cardiomegaly noted.   Original Report Authenticated By: Tonia Ghent, M.D.   Dg Chest Port 1 View  07/10/2012   *RADIOLOGY REPORT*  Clinical Data: Respiratory distress  PORTABLE CHEST - 1 VIEW  Comparison: 07/07/2012  Findings: The patient is rotated to the right.  Lungs are markedly hypo aerated with crowding of the bronchovascular markings. Prominence of the hila bilaterally likely reflects vascular congestion in the setting of cardiomegaly. Aorta is ectatic and unfolded.  No pleural effusion.  IMPRESSION: Cardiomegaly with central vascular congestion and hypoaeration. If the patient's symptoms continue, consider PA and lateral chest radiographs obtained at full inspiration when the patient is clinically able.   Original Report Authenticated By: Christiana Pellant, M.D.   Dg Abd 2 Views  07/11/2012   *RADIOLOGY REPORT*  Clinical Data: Small bowel obstruction.  Fecal impaction.  ABDOMEN - 2 VIEW  Comparison: Radiographs and CT scan dated 07/10/2012  Findings: NG tube has been inserted and the stomach is now decompressed.  There is no free air in the abdomen.  There are no dilated loops of bowel. No appreciable stool in the rectum.  No acute osseous abnormality.  IMPRESSION:  1.  No fecal impaction. 2.  No small bowel obstruction. 3.  Stomach has been decompressed by the NG tube.   Original Report Authenticated By: Francene Boyers, M.D.   Dg Abd Acute W/chest  07/07/2012   *RADIOLOGY REPORT*  Clinical Data: Crampy abdominal pain.  Cough and chest congestion. Morbidly obese patient with history of coronary artery disease. Former smoker.  ACUTE ABDOMEN SERIES (ABDOMEN 2 VIEW & CHEST 1 VIEW)  Comparison: Two-view chest x-ray 12/20/2011, 10/20/2010.  No prior abdominal imaging.  Findings: Less than optimal examination due to body habitus.  Bowel gas pattern unremarkable without evidence of obstruction or significant ileus.  No evidence of free air or significant air fluid levels on the lateral decubitus image.  Edema in the subcutaneous tissues of the abdominal wall.  No visible opaque urinary tract calculi.  Severe degenerative changes involving the lumbar spine.  Cardiac silhouette enlarged but stable.  Thoracic aorta mildly tortuous and atherosclerotic, unchanged.  Hilar and mediastinal contours otherwise unremarkable.  Low lung volumes due to body habitus with crowded bronchovascular markings diffusely.  Lungs otherwise clear.  Mitral annular calcification.  IMPRESSION:  1.  No acute abdominal abnormality. 2.  Stable cardiomegaly.  Suboptimal inspiration.  No acute cardiopulmonary disease.   Original Report Authenticated By: Hulan Saas, M.D.    Microbiology: Recent Results (from the past 240 hour(s))  MRSA PCR SCREENING  Status: None   Collection Time     07/10/12  5:33 AM      Result Value Range Status   MRSA by PCR NEGATIVE  NEGATIVE Final   Comment:            The GeneXpert MRSA Assay (FDA     approved for NASAL specimens     only), is one component of a     comprehensive MRSA colonization     surveillance program. It is not     intended to diagnose MRSA     infection nor to guide or     monitor treatment for     MRSA infections.  CULTURE, BLOOD (ROUTINE X 2)     Status: None   Collection Time    07/10/12  6:40 AM      Result Value Range Status   Specimen Description BLOOD LEFT ARM   Final   Special Requests BOTTLES DRAWN AEROBIC AND ANAEROBIC 5 CC EACH   Final   Culture  Setup Time 07/10/2012 10:36   Final   Culture NO GROWTH 5 DAYS   Final   Report Status 07/16/2012 FINAL   Final  CULTURE, BLOOD (ROUTINE X 2)     Status: None   Collection Time    07/10/12  6:43 AM      Result Value Range Status   Specimen Description BLOOD RIGHT HAND   Final   Special Requests BOTTLES DRAWN AEROBIC ONLY 2 CC   Final   Culture  Setup Time 07/10/2012 10:36   Final   Culture NO GROWTH 5 DAYS   Final   Report Status 07/16/2012 FINAL   Final  BODY FLUID CULTURE     Status: None   Collection Time    07/13/12  3:29 PM      Result Value Range Status   Specimen Description ASCITIC   Final   Special Requests Normal   Final   Gram Stain     Final   Value: RARE WBC PRESENT,BOTH PMN AND MONONUCLEAR     NO ORGANISMS SEEN   Culture NO GROWTH 2 DAYS   Final   Report Status PENDING   Incomplete     Labs: Basic Metabolic Panel:  Recent Labs Lab 07/12/12 0100 07/13/12 0331 07/14/12 0325 07/15/12 0530 07/16/12 0441  NA 139 136 134* 135 136  K 3.8 3.7 3.7 3.9 3.8  CL 106 103 103 102 103  CO2 25 24 22 25 24   GLUCOSE 94 82 90 87 96  BUN 34* 31* 27* 23 21  CREATININE 1.26* 1.28* 1.10 1.10 1.12*  CALCIUM 8.7 8.5 8.1* 8.1* 8.3*   Liver Function Tests:  Recent Labs Lab 07/11/12 0045 07/14/12 0325 07/15/12 0530 07/16/12 0441  AST 63* 34  36 37  ALT 21 22 24 24   ALKPHOS 71 61 71 75  BILITOT 0.6 0.5 0.7 0.7  PROT 6.5 5.3* 5.4* 5.5*  ALBUMIN 2.9* 2.3* 2.3* 2.4*   No results found for this basename: LIPASE, AMYLASE,  in the last 168 hours No results found for this basename: AMMONIA,  in the last 168 hours CBC:  Recent Labs Lab 07/11/12 0045  07/12/12 1720 07/13/12 0331 07/14/12 0325 07/15/12 0530 07/16/12 0441  WBC 9.5  --   --  7.0 6.5 7.3 7.6  HGB 10.6*  < > 10.3* 9.7* 10.1* 9.5* 9.8*  HCT 34.0*  < > 33.2* 38.3 31.5* 30.4* 30.4*  MCV 86.3  --   --  104.1*  86.1 84.2 83.7  PLT PLATELET CLUMPS NOTED ON SMEAR, COUNT APPEARS ADEQUATE  --   --  98* 76* 88* 99*  < > = values in this interval not displayed. Cardiac Enzymes: No results found for this basename: CKTOTAL, CKMB, CKMBINDEX, TROPONINI,  in the last 168 hours BNP: BNP (last 3 results)  Recent Labs  07/07/12 1038 07/10/12 1025  PROBNP 21555.0* 28016.0*   CBG:  Recent Labs Lab 07/16/12 1141 07/16/12 1647 07/16/12 2204 07/17/12 0741 07/17/12 1133  GLUCAP 94 98 119* 97 114*       Signed:  Faheem [name],Holly [name]  [facility] 07/17/2012, 1:14 PM

## 2012-07-16 NOTE — Progress Notes (Signed)
Palliative Medicine Team Progress Note   S:  Ms. Axtell is about the same today-she is anxious to get out of the hospital. She is looking forward to getting back to her own home-even if this is short term. She acknowledges how serious her health problems are and tells me that she "should have been gone a long time ago". We again discussed the Hospice philosophy and she is open to this service.   She complains of skin rash and "itching all over" today-she reports being sensitive to soaps.  Filed Vitals:   07/16/12 1300  BP: 121/72  Pulse: 96  Temp: 97.5 F (36.4 C)  Resp: 14   Skin rash looks like scaling circular areas with excoriations on her arms-difficult to see due to ecchymoses on her extremities-some scaling lesions on her torso and back as well. Edema 2+ but better in her hands and arms today. Abdomen is soft.   Assessment:  Ms. Boehm has multiple end-stage terminal problems, but curretly she is stable. Permanent immobility/bedbound, but mentally very sharp. She doesn't have a significant amount of dyspnea because she rarely moves. She denies pain at present.   Plan:  1. Disposition/Care coordination: Have discussed at length with patient and family- she is interested in Hospice and will have our RN Hospice Liaison discuss specifics with her on Monday. She has a multitude of potential qualifying diagnoses for hospice care for their review and consideration. Her most life limiting issues is probably her Liver Failure/Congestive hepatopathy and Pulmonary Disease associated with severe Right Heart Failure/Pulmonary HTN.  PPS: 40%, disabling dyspnea with minimal exertion, on home O2 at 3L. Will go home with 24/7 paid in home caregivers and Hospice referral.  2. Itching- Prn Atarax and Sarna lotion- rash seems more lichenoid vs. Eczematous-she may benefit from brief burst of steroids but some risk involved with that in terms of her volume status. If itching gets worse will discuss option  with her.    Time: 35 minutes   Anderson Malta, DO Palliative Medicine

## 2012-07-16 NOTE — Progress Notes (Addendum)
ANTICOAGULATION CONSULT NOTE - Follow UP  Pharmacy Consult for Warfarin Indication: Atrial fibrillation  Allergies  Allergen Reactions  . Amoxicillin-Pot Clavulanate     Diarrhea ,fatigue mouth sores   . Doxycycline Hyclate   . Iodinated Diagnostic Agents     Full body rash   . Nitrofuran Derivatives   . Norvasc (Amlodipine Besylate)     Leg swelling  . Plavix (Clopidogrel Bisulfate)     REACTION: SOB, and rash  . Sulfa Antibiotics     And adhesive tape  . Phenergan (Promethazine Hcl) Anxiety   Labs:  Recent Labs  07/14/12 0325 07/15/12 0530 07/16/12 0441  HGB 10.1* 9.5* 9.8*  HCT 31.5* 30.4* 30.4*  PLT 76* 88* 99*  LABPROT 26.4* 17.3* 16.2*  INR 2.58* 1.46 1.33  CREATININE 1.10 1.10 1.12*    Estimated Creatinine Clearance: 51.8 ml/min (by C-G formula based on Cr of 1.12).   Assessment: 66 YOF w/ hx Afibon chronic Coumadin PTA.  Patient was admitted 5/29 w/ AKI and constipation. INR was therapeutic on admission and Coumadin resumed 5/30 PM. Pt was reportedly taking Coumadin 5 mg MWF and 2.5 mg other days PTA. Coumadin consult was discontinued 6/2 given abd pain with + occult blood test.  Pt did not receive any Coumadin from 5/31 to 6/6 (reason above and elevated INR). Pt received Vitamin K 10mg  on 6/5 and 6/6.  On 6/7, INR trended down to 1.46, MD ordered to resume Coumadin.   Expect resistance to Coumadin given Vit K administration especially given the route of administration (SQ) and large dose.  However, pt is on Fluconazole which may increase sensitivity to Coumadin  INR today further dropped to 1.33 as expected.  Given recent supratherapeutic INR without Coumadin - pharmacy will be conservative with Coumadin dosing for now.  H/H stable/low, plts improve to 99K, no bleeding documented.  Goal of Therapy:  INR 2-3   Plan:   Repeat Coumadin 5 mg po x 1 tonight  Daily PT/INR  Continue sq heparin 5000 units TID as ordered by MD for now   Pharmacy will  f/u  Geoffry Paradise, PharmD, BCPS Pager: [phone] 7:58 AM Pharmacy #: [id number]

## 2012-07-16 NOTE — Progress Notes (Signed)
[facility] PROGRESS NOTE  Holly [name] ZOX:[id number] DOB: May 22, 1931 DOA: 07/07/2012 PCP: [name], MD  Assessment/Plan: Active Problems:   DIABETES MELLITUS, TYPE II   Morbid obesity   CORONARY ARTERY DISEASE   INTERSTITIAL LUNG DISEASE   Acute on chronic renal failure   Unspecified constipation   Cirrhosis of liver without mention of alcohol   Ascites    1. Nausea/Vomiting: Patient has a known history of chronic nausea with occasional vomiting. On 07/09/12, she developed acute nausea/vomiting, associated with coffee grounds, in the setting of Coumadin therapy. Etiology is suspected to be gastritis vs esophagitis. Managed with bowel rest, NG-T, antiemetics and PPI. Dr Lina Sar provided GI consultation. KUB revealed moderate gastric distention, and abdominal /pelvic CT scan showed no evidence of of obstruction. She may have underlying gastroparesis. Initial NG-T output was quite large, but as of 07/11/12, on GI recommendations, we were able to clamp, then discontinue NG-T, without deleterious effect. Patient was successfully advanced to regular diet on 07/12/12. Resolved.  2. Fecal Impaction/constipation:  Patient has been troubled by constipation, sometimes alternating with loose stools, and takes anti-diarrheals as needed. Suspect multifactorial (immobility, medications, maybe IBS), with superimposed acute constipation. KUB /CTscan negative for obstructive process. Managed with enemas' with satisfactory response. Now resolved.  3. Hepatic Cirrhosis/Ascites: CT scan demonstrated large volume abdominal/pelvic ascites and diffuse anasarca. Irregular contour of the liver suggesting cirrhosis. Differential-diagnostic considerations include NASH, vs cardiac cirrhosis. Hepatitis B & C serologies are negative. 2D Echocardiogram was done on 07/11/12 and revealed EF of 55% to 60%, diastolic flattening of interventricular septum, moderately dilated LA, moderately dilated RV, mildly  dilated RA, severe Tricuspid regurgitation. PA peak pressure: 90mm Hg (S), all consistent with pulmonary HTN and right heart failure. Diagnostic/Therapeutic paracentesis was done by IR on 07/13/12, with evacuation of 5.6 L fluid. Cell count is only 17 erasing concern for SBP. SAAG>1.1, consistent with portal hypertension. Meanwhile, low sodium diet and Lasix. Changed to PO diuretics on 07/15/12. Stable.  4. Acute respiratory failure: Patient developed acute hypoxic respiratory failure on 07/10/12, following administration of Phenergan, for emesis. Dr Coralyn Helling provided pulmonary consultation, and has opined that this was multi-factorial, acute on chronic hypoxic resp failure in the setting of MO, OSA, AMS from sedation, lack of mandatory Cpap. Possible aspiration, is also a consideration. Patient apparently, also has ILD, under care of Dr Marcelyn Bruins, pulmonologist. CXR indicated mild CHF. Respitatory status has considerably improved with large volume paracentesis, utilization of nocturnal Bipap and diuresis. Now saturating at 96%-97% on 2L.  5. Chronic Diastolic Heart Failure: Patient has a known history of diastolic CHF. This may have been contributory to acute decompensation of respiratory status. See #4 above. Lasix temporarily placed on hold, due to ARF. Lasix was recommenced on 07/12/12, and patient appears to be tolerating this. Otherwise stable, at this time.  6. Acute on chronic Renal Failure: At presentation, creatinine was 2.45. Probably pre renal (ATN) in setting of poor oral intake, diuretic, ACE and UTI. Managed with IV fluids, ACE-i placed on hold (baseline CR About 1.6). Urine output has been good, and creatinine has trended down, and as of 07/14/12, has normalized at 1.10.   7. UTI: UA with positive nitrates, WBC 21 to 50. Urine culture grew pan-sensitive E. Coli. Managed with iv Rocephin. Antibiotics were concluded on 07/13/12, after 7 days of therapy.  8. Diabetes: This is type-2 and CBGs have  remained satisfactory on SSI. HBA1C is 6.  9. A fib: Rate-controlled on Metoprolol. Coumadin  was briefly placed on hold, due to supra-therapeutic INR. Vit K was administered on 07/13/12-07/14/12. Now back on Coumadin. Telemetric monitoring discontinued on 07/16/12.  10. Morbid Obesity/OSA: On nocturnal CPAP.  11. RA: Continued on Plaquenil. Not problematic. 12. Thrush: Patient  Was found to have oral thrush incidentally, on physical examination. Initially managed with Nystatin swish. Changed to Diflucan on 07/10/12, now day #7. Oral thrush has resolved. Diflucan discontinued today.  13. Thrombocytopenia: Chronic, stable. Following CBC.    Code Status: DNR/DNI.  Family Communication: Family fully updated at bedside.  Disposition Plan: To be determined. Given multiple irreversible co-morbidities, have consulted PMT for GOC. Dr Julaine Fusi provided PMT consultation. Aiming discharge on 07/17/12 or 07/18/12, to home with Hospice and 24 hour care.    Brief narrative: 79 yo female, with known RA, Morbid obesity, DM, Diastolic Heart Failure, CAD, A. fib on coumadin, CKD, OSA, Cpap dependent.who was referred by PCP to the ED for evaluation of worsening renal function. Patient saw her PCP day prior to admission, because she was having problems with constipation and decreased urine output. She did develop brownish emesis 07/10/12 and was treated with Phenergan for nausea/vomiting. She was moved to the ICU afterwards, for AMS following Phenergan and was found to be hypoxic by ABG, consistent with hypoventilation. PCCM was consulted to assess acute on chronic respiratory failure.   Consultants:  Dr Coralyn Helling, PCCM.  Dr Lina Sar, GI.  Dr Julaine Fusi, PMT.   Procedures:  See Notes.   Antibiotics:  Rocephin 07/07/12-07/13/12.   Vancomycin 07/09/12-6//3/14.   Diflucan 07/10/12>>>  HPI/Subjective: Feels quite well. No new issues.   Objective: Vital signs in last 24 hours: Temp:  [97.8 F (36.6 C)] 97.8  F (36.6 C) (06/08 0637) Pulse Rate:  [88-92] 92 (06/08 0637) Resp:  [16-18] 18 (06/08 0637) BP: (106-126)/(49-56) 123/49 mmHg (06/08 0637) SpO2:  [97 %-98 %] 97 % (06/08 0900) Weight change:  Last BM Date: 07/15/12  Intake/Output from previous day: 06/07 0701 - 06/08 0700 In: 1083 [P.O.:1080; I.V.:3] Out: 60 [Urine:60]     Physical Exam: General: Comfortable, alert, communicative, fully oriented, not short of breath at rest.  HEENT:  Mild clinical pallor, no jaundice, no conjunctival injection or discharge. NECK:  Supple, JVP not seen, no carotid bruits, no palpable lymphadenopathy, no palpable goiter. CHEST:  Clinically clear to auscultation, no wheezes, no crackles. HEART:  Sounds 1 and 2 heard, normal, irregular, no murmurs. ABDOMEN:  Morbidly obese, soft, non-tender, no palpable organomegaly, no palpable masses, normal bowel sounds.  GENITALIA:  Not examined. LOWER EXTREMITIES:  Moderate pitting edema, palpable peripheral pulses. MUSCULOSKELETAL SYSTEM:  Generalized osteoarthritic changes, otherwise, normal. CENTRAL NERVOUS SYSTEM:  No focal neurologic deficit on gross examination.  Lab Results:  Recent Labs  07/15/12 0530 07/16/12 0441  WBC 7.3 7.6  HGB 9.5* 9.8*  HCT 30.4* 30.4*  PLT 88* 99*    Recent Labs  07/15/12 0530 07/16/12 0441  NA 135 136  K 3.9 3.8  CL 102 103  CO2 25 24  GLUCOSE 87 96  BUN 23 21  CREATININE 1.10 1.12*  CALCIUM 8.1* 8.3*   Recent Results (from the past 240 hour(s))  URINE CULTURE     Status: None   Collection Time    07/07/12 12:27 PM      Result Value Range Status   Specimen Description URINE, RANDOM   Final   Special Requests NONE   Final   Culture  Setup Time 07/07/2012 23:01  Final   Colony Count >=100,000 COLONIES/ML   Final   Culture ESCHERICHIA COLI   Final   Report Status 07/09/2012 FINAL   Final   Organism ID, Bacteria ESCHERICHIA COLI   Final  MRSA PCR SCREENING     Status: None   Collection Time     07/10/12  5:33 AM      Result Value Range Status   MRSA by PCR NEGATIVE  NEGATIVE Final   Comment:            The GeneXpert MRSA Assay (FDA     approved for NASAL specimens     only), is one component of a     comprehensive MRSA colonization     surveillance program. It is not     intended to diagnose MRSA     infection nor to guide or     monitor treatment for     MRSA infections.  CULTURE, BLOOD (ROUTINE X 2)     Status: None   Collection Time    07/10/12  6:40 AM      Result Value Range Status   Specimen Description BLOOD LEFT ARM   Final   Special Requests BOTTLES DRAWN AEROBIC AND ANAEROBIC 5 CC EACH   Final   Culture  Setup Time 07/10/2012 10:36   Final   Culture     Final   Value:        BLOOD CULTURE RECEIVED NO GROWTH TO DATE CULTURE WILL BE HELD FOR 5 DAYS BEFORE ISSUING A FINAL NEGATIVE REPORT   Report Status PENDING   Incomplete  CULTURE, BLOOD (ROUTINE X 2)     Status: None   Collection Time    07/10/12  6:43 AM      Result Value Range Status   Specimen Description BLOOD RIGHT HAND   Final   Special Requests BOTTLES DRAWN AEROBIC ONLY 2 CC   Final   Culture  Setup Time 07/10/2012 10:36   Final   Culture     Final   Value:        BLOOD CULTURE RECEIVED NO GROWTH TO DATE CULTURE WILL BE HELD FOR 5 DAYS BEFORE ISSUING A FINAL NEGATIVE REPORT   Report Status PENDING   Incomplete  BODY FLUID CULTURE     Status: None   Collection Time    07/13/12  3:29 PM      Result Value Range Status   Specimen Description ASCITIC   Final   Special Requests Normal   Final   Gram Stain     Final   Value: RARE WBC PRESENT,BOTH PMN AND MONONUCLEAR     NO ORGANISMS SEEN   Culture NO GROWTH 1 DAY   Final   Report Status PENDING   Incomplete     Studies/Results: No results found.  Medications: Scheduled Meds: . antiseptic oral rinse  15 mL Mouth Rinse q12n4p  . camphor-menthol   Topical BID  . chlorhexidine  15 mL Mouth Rinse BID  . fluconazole  100 mg Oral Daily  .  furosemide  40 mg Oral Daily  . heparin subcutaneous  5,000 Units Subcutaneous Q8H  . insulin aspart  0-20 Units Subcutaneous TID WC  . insulin aspart  0-5 Units Subcutaneous QHS  . levalbuterol  0.63 mg Nebulization Q8H  . mineral oil  1 enema Rectal Once  . pantoprazole  40 mg Oral BID  . polyethylene glycol  17 g Oral Daily  . rOPINIRole  0.5 mg Oral BID  .  simvastatin  20 mg Oral QHS  . sodium chloride  3 mL Intravenous Q12H  . spironolactone  50 mg Oral BID  . warfarin  5 mg Oral ONCE-1800  . Warfarin - Pharmacist Dosing Inpatient   Does not apply q1800   Continuous Infusions: . sodium chloride 10 mL/hr at 07/10/12 1900   PRN Meds:.acetaminophen, acetaminophen, ALPRAZolam, hydrOXYzine, levalbuterol, lip balm, ondansetron    LOS: 9 days   Brady Schiller,CHRISTOPHER  [facility] Pager [phone]. If 8PM-8AM, please contact night-coverage at [email or web address], password [facility] 07/16/2012, 12:12 PM  LOS: 9 days

## 2012-07-17 LAB — BODY FLUID CULTURE
Culture: NO GROWTH
Special Requests: NORMAL

## 2012-07-17 LAB — GLUCOSE, CAPILLARY
Glucose-Capillary: 97 mg/dL (ref 70–99)
Glucose-Capillary: 114 mg/dL — ABNORMAL HIGH (ref 70–99)

## 2012-07-17 LAB — PROTIME-INR: INR: 1.39 (ref 0.00–1.49)

## 2012-07-17 MED ORDER — FUROSEMIDE 40 MG PO TABS
60.0000 mg | ORAL_TABLET | Freq: Every day | ORAL | Status: DC
  Administered 2012-07-17: 60 mg via ORAL
  Filled 2012-07-17 (×2): qty 1

## 2012-07-17 MED ORDER — FUROSEMIDE 20 MG PO TABS: 60.0000 mg | ORAL_TABLET | Freq: Every day | ORAL | Status: DC

## 2012-07-17 MED ORDER — SPIRONOLACTONE 50 MG PO TABS: 50.0000 mg | ORAL_TABLET | Freq: Two times a day (BID) | ORAL | Status: DC

## 2012-07-17 MED ORDER — CAMPHOR-MENTHOL 0.5-0.5 % EX LOTN: TOPICAL_LOTION | Freq: Two times a day (BID) | CUTANEOUS | Status: DC

## 2012-07-17 MED ORDER — ALPRAZOLAM 0.25 MG PO TABS: 0.2500 mg | ORAL_TABLET | Freq: Every evening | ORAL | Status: AC | PRN

## 2012-07-17 MED ORDER — WARFARIN SODIUM 5 MG PO TABS
5.0000 mg | ORAL_TABLET | Freq: Once | ORAL | Status: DC
Start: 2012-07-17 — End: 2012-07-17
  Filled 2012-07-17: qty 1

## 2012-07-17 MED ORDER — PANTOPRAZOLE SODIUM 40 MG PO TBEC: 40.0000 mg | DELAYED_RELEASE_TABLET | Freq: Two times a day (BID) | ORAL | Status: DC

## 2012-07-17 MED ORDER — LEVALBUTEROL HCL 0.63 MG/3ML IN NEBU: 0.6300 mg | INHALATION_SOLUTION | RESPIRATORY_TRACT | Status: AC | PRN

## 2012-07-17 MED ORDER — HYDROXYZINE HCL 10 MG PO TABS: 10.0000 mg | ORAL_TABLET | Freq: Four times a day (QID) | ORAL | Status: DC | PRN

## 2012-07-17 MED ORDER — POLYETHYLENE GLYCOL 3350 17 G PO PACK: 17.0000 g | PACK | Freq: Every day | ORAL | Status: AC

## 2012-07-17 NOTE — Progress Notes (Signed)
Patient ID: Holly [name], female   DOB: 1931/11/14, 79 y.o.   MRN: [id number] [facility] Progress Note  Subjective: Looking forward to going home- not sure what she will do at night- not sure she can trust someone she doesn't know to be in her house at night. Breathing still not at her baseline. Having Bm;s, no vomiting. She is still leaking from paracentesis sight.  Objective:  Vital signs in last 24 hours: Temp:  [97.5 F (36.4 C)-97.8 F (36.6 C)] 97.7 F (36.5 C) (06/09 0555) Pulse Rate:  [80-96] 96 (06/09 0555) Resp:  [14-20] 20 (06/09 0555) BP: (113-121)/(64-72) 120/67 mmHg (06/09 0555) SpO2:  [94 %-98 %] 94 % (06/09 0847) Weight:  [242 lb (109.77 kg)-242 lb 15.2 oz (110.2 kg)] 242 lb (109.77 kg) (06/09 0555) Last BM Date: 07/17/12 General:   Alert,  Well-developed, chronically ill appearing Wf   in NAD Heart:  Regular rate and rhythm; no murmurs Pulm;bibasialr crackles Abdomen:  Soft, very obese, 2+ edema in soft  tissue of abdomen  And nontense ascites Extremities:  2 + edema above knees Neurologic:  Alert and  oriented x4;  grossly normal neurologically. Psych:  Alert and cooperative. Normal mood and affect.  Intake/Output from previous day: 06/08 0701 - 06/09 0700 In: 800 [P.O.:800] Out: -  Intake/Output this shift:    Lab Results:  Recent Labs  07/15/12 0530 07/16/12 0441  WBC 7.3 7.6  HGB 9.5* 9.8*  HCT 30.4* 30.4*  PLT 88* 99*   BMET  Recent Labs  07/15/12 0530 07/16/12 0441  NA 135 136  K 3.9 3.8  CL 102 103  CO2 25 24  GLUCOSE 87 96  BUN 23 21  CREATININE 1.10 1.12*  CALCIUM 8.1* 8.3*   LFT  Recent Labs  07/16/12 0441  PROT 5.5*  ALBUMIN 2.4*  AST 37  ALT 24  ALKPHOS 75  BILITOT 0.7   PT/INR  Recent Labs  07/16/12 0441 07/17/12 0420  LABPROT 16.2* 16.7*  INR 1.33 1.39     Assessment / Plan: #1 79 yo  Female with Right heart failure,cirrhosis/NASH  and anasarca She is volume overloaded- will increase  diuretics on lasix and aldactone #2 leaking paracentesis sight #3 Interstitial lung disease- 02 dependent/bipap at home #4 CAD #5 morbid obesity #6 DM #7 Wheelchair bound  Active Problems:   DIABETES MELLITUS, TYPE II   Morbid obesity   CORONARY ARTERY DISEASE   INTERSTITIAL LUNG DISEASE   Acute on chronic renal failure   Unspecified constipation   Cirrhosis of liver without mention of alcohol   Ascites     LOS: 10 days   Holly [name]  07/17/2012, 9:14 AM

## 2012-07-17 NOTE — Progress Notes (Signed)
ANTICOAGULATION CONSULT NOTE - Follow UP  Pharmacy Consult for Warfarin Indication: Atrial fibrillation  Allergies  Allergen Reactions  . Amoxicillin-Pot Clavulanate     Diarrhea ,fatigue mouth sores   . Doxycycline Hyclate   . Iodinated Diagnostic Agents     Full body rash   . Nitrofuran Derivatives   . Norvasc (Amlodipine Besylate)     Leg swelling  . Plavix (Clopidogrel Bisulfate)     REACTION: SOB, and rash  . Sulfa Antibiotics     And adhesive tape  . Phenergan (Promethazine Hcl) Anxiety   Labs:  Recent Labs  07/15/12 0530 07/16/12 0441 07/17/12 0420  HGB 9.5* 9.8*  --   HCT 30.4* 30.4*  --   PLT 88* 99*  --   LABPROT 17.3* 16.2* 16.7*  INR 1.46 1.33 1.39  CREATININE 1.10 1.12*  --     Estimated Creatinine Clearance: 51 ml/min (by C-G formula based on Cr of 1.12).   Assessment: 44 YOF w/ hx Afib on chronic Coumadin PTA.  Patient was admitted 5/29 w/ AKI and constipation. INR was therapeutic on admission and Coumadin resumed 5/30 PM. Pt was reportedly taking Coumadin 5 mg MWF and 2.5 mg other days PTA. Coumadin consult was discontinued 6/2 given abd pain with + occult blood test.  Pt did not receive any Coumadin from 5/31 to 6/6 (reason above and elevated INR). Pt received Vitamin K 10mg  on 6/5 and 6/6.  On 6/7, INR trended down to 1.46, MD ordered to resume Coumadin.   Expect resistance to Coumadin given Vit K administration especially given the route of administration (SQ) and large dose.  However, pt is on Fluconazole which may increase sensitivity to Coumadin (d/c'd 6/8)  INR remains subtherapeutic as expected.  Given recent supratherapeutic INR without Coumadin - pharmacy will be conservative with Coumadin dosing for now.  H/H stable/low, plts improve to 99K, no bleeding documented.  Goal of Therapy:  INR 2-3   Plan:   Repeat Coumadin 5 mg po x 1 tonight  Daily PT/INR  Continue sq heparin 5000 units TID as ordered by MD for now   If patient is  discharged today, suggest sending home on usual home regimen with INR check on Wed 6/11 or Thurs 6/12  Darrol Angel, PharmD Pager: [phone] 07/17/2012 11:43 AM

## 2012-07-17 NOTE — Progress Notes (Signed)
Notified by Lanier Clam CMRN, patient and family request services of [facility]ospcie and [facility] of [facility]) after discharge.  Spoke with patient and daughter-in-law Jan at bedside initiated education related to hospice services, philosophy and team approach to care with good understanding voiced by patient and d-i-l Jan.  Per notes plan is to d/c today by personal vehicle which has been handicapped out-fitted for patient's needs Daughter in law has arranged for 24/7 caregivers through agency [employer] to assist with patient's care- daughter in law Jan will be staying with patient this evening and tomorrow to get things settled  DME needs discussed: pt currently has a manual hospital bed which she owns in the home; a hoyer lift; wheel chair; oxygen and C-pap machine  Patient/family requesting Complete package D: fully electric hospital with full rails and over-bed table to be delivered to the home  Note patient now is requiring continuous O2 @ 3-3.5 LNC there is a concentrator in the home however daughter-in-law is requesting [facility] come to instruct on use as patient was only using this at night or intermittently prior to this hospitalization - also patient will require a nebulizer machine at home as she will discharge with prescription for PRN Neb treatments Spoke with Mayra Reel [facility] representative to share above information regarding DME -please contact d-i-l Jan atrc: [phone] to arrange delivery; if available, Mayra Reel may bring nebulizer machine for family to take home with d/c supplies   Initial paperwork faxed to [facility] Please notify [facility] when patient is ready to leave unit at d/c call [phone] (or [phone] if after 5 pm);  [facility] information and contact numbers also given to daughter-in-law Jan during visit.   Above information shared with Lanier Clam [facility] Please call with any questions or concerns   Valente David, RN 07/17/2012, 1:38 PM Hospice and Palliative Care of  [facility] Palliative Medicine Team RN Liaison [phone]

## 2012-07-18 ENCOUNTER — Telehealth: Payer: Self-pay | Admitting: Pulmonary Disease

## 2012-07-18 NOTE — Progress Notes (Signed)
Agree w/ Ms. Esterwood's note.

## 2012-07-18 NOTE — Telephone Encounter (Signed)
LMTCB

## 2012-07-19 NOTE — Telephone Encounter (Signed)
lmomtcb for Holly [name] Fail to give VO for o2

## 2012-07-19 NOTE — Telephone Encounter (Signed)
I spoke with Toniann Fail, hospice nurse, and she is requesting an order for 3 liters oxygen and an order for portable tanks for the pt to use with exertion and as needed. Looks like hospice was ordered during the most recent hospital stay.  Toniann Fail states that the pt is more SOB especially with exertion. She states that they do not require qualifying sats with hospice per their protocol they can just requests oxygen. The pt PCP is attending for hospice. Please advise if it is ok to give the verbal to hospice for oxygen? Carron Curie, CMA

## 2012-07-19 NOTE — Telephone Encounter (Signed)
lmomtcb  

## 2012-07-19 NOTE — Telephone Encounter (Signed)
Yes it is!

## 2012-07-20 NOTE — Telephone Encounter (Signed)
LMOM TCB x2

## 2012-07-21 NOTE — Telephone Encounter (Signed)
Spoke with Toniann Fail form Hospice- advised that [facility] has ok'd these orders Verbalized understanding and nothing further needed at this time

## 2012-08-21 ENCOUNTER — Telehealth: Payer: Self-pay | Admitting: Pulmonary Disease

## 2012-08-21 NOTE — Telephone Encounter (Signed)
Orders in green folder for review. Amy from Hospice requests these be reviewed, signed and faxed back ASAP.  Please advise Dr Clance. Thanks.  

## 2012-08-21 NOTE — Telephone Encounter (Signed)
done

## 2012-08-21 NOTE — Telephone Encounter (Signed)
Spoke with Amy with Hospice.  She is faxing over order forms to be reviewed and signed by Clance.

## 2012-10-02 ENCOUNTER — Encounter (HOSPITAL_COMMUNITY): Payer: Self-pay | Admitting: Emergency Medicine

## 2012-10-02 ENCOUNTER — Inpatient Hospital Stay (HOSPITAL_COMMUNITY)
Admission: EM | Admit: 2012-10-02 | Discharge: 2012-10-04 | DRG: 683 | Disposition: A | Discharge status: Home / Self Care | Payer: Medicare Other | Attending: Internal Medicine | Admitting: Internal Medicine

## 2012-10-02 DIAGNOSIS — I2789 Other specified pulmonary heart diseases: Secondary | ICD-10-CM | POA: Diagnosis present

## 2012-10-02 DIAGNOSIS — M1990 Unspecified osteoarthritis, unspecified site: Secondary | ICD-10-CM

## 2012-10-02 DIAGNOSIS — J8410 Pulmonary fibrosis, unspecified: Secondary | ICD-10-CM | POA: Diagnosis present

## 2012-10-02 DIAGNOSIS — Z7901 Long term (current) use of anticoagulants: Secondary | ICD-10-CM

## 2012-10-02 DIAGNOSIS — N179 Acute kidney failure, unspecified: Principal | ICD-10-CM | POA: Diagnosis present

## 2012-10-02 DIAGNOSIS — Z79899 Other long term (current) drug therapy: Secondary | ICD-10-CM

## 2012-10-02 DIAGNOSIS — I509 Heart failure, unspecified: Secondary | ICD-10-CM | POA: Diagnosis present

## 2012-10-02 DIAGNOSIS — IMO0001 Reserved for inherently not codable concepts without codable children: Secondary | ICD-10-CM | POA: Diagnosis present

## 2012-10-02 DIAGNOSIS — G4733 Obstructive sleep apnea (adult) (pediatric): Secondary | ICD-10-CM

## 2012-10-02 DIAGNOSIS — N189 Chronic kidney disease, unspecified: Secondary | ICD-10-CM | POA: Diagnosis present

## 2012-10-02 DIAGNOSIS — I2510 Atherosclerotic heart disease of native coronary artery without angina pectoris: Secondary | ICD-10-CM | POA: Diagnosis present

## 2012-10-02 DIAGNOSIS — M069 Rheumatoid arthritis, unspecified: Secondary | ICD-10-CM | POA: Diagnosis present

## 2012-10-02 DIAGNOSIS — N390 Urinary tract infection, site not specified: Secondary | ICD-10-CM | POA: Diagnosis present

## 2012-10-02 DIAGNOSIS — K5900 Constipation, unspecified: Secondary | ICD-10-CM

## 2012-10-02 DIAGNOSIS — E860 Dehydration: Secondary | ICD-10-CM | POA: Diagnosis present

## 2012-10-02 DIAGNOSIS — E119 Type 2 diabetes mellitus without complications: Secondary | ICD-10-CM | POA: Diagnosis present

## 2012-10-02 DIAGNOSIS — I4891 Unspecified atrial fibrillation: Secondary | ICD-10-CM | POA: Diagnosis present

## 2012-10-02 DIAGNOSIS — K7460 Unspecified cirrhosis of liver: Secondary | ICD-10-CM

## 2012-10-02 DIAGNOSIS — Z66 Do not resuscitate: Secondary | ICD-10-CM | POA: Diagnosis present

## 2012-10-02 DIAGNOSIS — IMO0002 Reserved for concepts with insufficient information to code with codable children: Secondary | ICD-10-CM

## 2012-10-02 DIAGNOSIS — K219 Gastro-esophageal reflux disease without esophagitis: Secondary | ICD-10-CM

## 2012-10-02 DIAGNOSIS — R188 Other ascites: Secondary | ICD-10-CM | POA: Diagnosis present

## 2012-10-02 LAB — URINALYSIS W MICROSCOPIC + REFLEX CULTURE
Bilirubin Urine: NEGATIVE
Nitrite: NEGATIVE
Specific Gravity, Urine: 1.012 (ref 1.005–1.030)
Urobilinogen, UA: 0.2 mg/dL (ref 0.0–1.0)

## 2012-10-02 LAB — CBC WITH DIFFERENTIAL/PLATELET
Basophils Absolute: 0 10*3/uL (ref 0.0–0.1)
Basophils Relative: 0 % (ref 0–1)
MCHC: 33.7 g/dL (ref 30.0–36.0)
Monocytes Absolute: 0.6 10*3/uL (ref 0.1–1.0)
Neutro Abs: 5.5 10*3/uL (ref 1.7–7.7)
Neutrophils Relative %: 65 % (ref 43–77)
RDW: 16.2 % — ABNORMAL HIGH (ref 11.5–15.5)

## 2012-10-02 LAB — COMPREHENSIVE METABOLIC PANEL
Alkaline Phosphatase: 92 U/L (ref 39–117)
BUN: 77 mg/dL — ABNORMAL HIGH (ref 6–23)
CO2: 25 mEq/L (ref 19–32)
Chloride: 93 mEq/L — ABNORMAL LOW (ref 96–112)
GFR calc Af Amer: 24 mL/min — ABNORMAL LOW (ref >90)
GFR calc non Af Amer: 21 mL/min — ABNORMAL LOW (ref >90)
Glucose, Bld: 92 mg/dL (ref 70–99)
Potassium: 5 mEq/L (ref 3.5–5.1)
Total Bilirubin: 0.6 mg/dL (ref 0.3–1.2)
Total Protein: 8.4 g/dL — ABNORMAL HIGH (ref 6.0–8.3)

## 2012-10-02 LAB — PRO B NATRIURETIC PEPTIDE: Pro B Natriuretic peptide (BNP): 3050 pg/mL — ABNORMAL HIGH (ref 0–450)

## 2012-10-02 MED ORDER — ALPRAZOLAM 0.25 MG PO TABS
0.2500 mg | ORAL_TABLET | Freq: Once | ORAL | Status: AC
  Administered 2012-10-02: 0.25 mg via ORAL
  Filled 2012-10-02: qty 1

## 2012-10-02 MED ORDER — SODIUM CHLORIDE 0.9 % IV SOLN
INTRAVENOUS | Status: DC
  Administered 2012-10-03: 01:00:00 via INTRAVENOUS
  Administered 2012-10-03 (×2): 100 mL/h via INTRAVENOUS
  Administered 2012-10-04: 06:00:00 via INTRAVENOUS

## 2012-10-02 MED ORDER — HYDROCODONE-ACETAMINOPHEN 10-325 MG PO TABS
1.0000 | ORAL_TABLET | Freq: Once | ORAL | Status: AC
  Administered 2012-10-02: 1 via ORAL
  Filled 2012-10-02: qty 1

## 2012-10-02 MED ORDER — SODIUM CHLORIDE 0.9 % IV BOLUS (SEPSIS)
500.0000 mL | Freq: Once | INTRAVENOUS | Status: AC
  Administered 2012-10-02: 500 mL via INTRAVENOUS

## 2012-10-02 MED ORDER — SODIUM CHLORIDE 0.9 % IV BOLUS (SEPSIS)
250.0000 mL | Freq: Once | INTRAVENOUS | Status: AC
  Administered 2012-10-02: 250 mL via INTRAVENOUS

## 2012-10-02 MED ORDER — DEXTROSE 5 % IV SOLN
1.0000 g | Freq: Once | INTRAVENOUS | Status: AC
  Administered 2012-10-02: 1 g via INTRAVENOUS
  Filled 2012-10-02: qty 10

## 2012-10-02 NOTE — H&P (Signed)
[facility] History and Physical  Patient: Holly [name]  WUJ:[id number]  DOB: 01-01-1932  DOA: 10/02/2012  Referring physician: Lottie Mussel, PA-C PCP: [name], MD  Chief Complaint: Abnormal lab  HPI: Holly [name] is a 79 y.o. female with Past medical history of pulmonary hypertension, chronic kidney disease, obstructive sleep apnea, ascites, diabetes, rheumatoid arthritis, who is on hospice. Today she went to see her rheumatologist we'll obtain her labs and found that she had acute kidney injury, recommended her to follow up with her PCP while for obtaining the labs again referred her for admission. Since last one month she has been having on and off confusion as per the daughter. She denies any fever, chills, headache, cough, chest pain, palpitation, shortness of breath, orthopnea, PND, nausea, vomiting, abdominal pain, diarrhea, constipation, active bleeding, burning urination, dizziness, pedal edema,  focal neurological deficit. She has been urinating well and mentions that she is compliant with Lasix and Aldactone and feel dehydrated. No flank pain.  Review of Systems: as mentioned in the history of present illness.  A Comprehensive review of the other systems is negative.  Past Medical History  Diagnosis Date  . Myalgia and myositis, unspecified   . Esophageal reflux   . Degeneration of intervertebral disc, site unspecified   . Morbid obesity   . Osteoarthrosis, unspecified whether generalized or localized, unspecified site   . Rheumatoid arthritis(714.0)   . Type II or unspecified type diabetes mellitus without mention of complication, not stated as uncontrolled   . CAD (coronary artery disease)   . Chronic kidney disease   . Thrombocytopenia    Past Surgical History  Procedure Laterality Date  . Appendectomy    . Rotator cuff repair    . Replacement total knee    . Tubal ligation    . Ankle fusion     Social History:  reports that she  quit smoking about 35 years ago. Her smoking use included Cigarettes. She has a 17 pack-year smoking history. She has never used smokeless tobacco. She reports that she does not drink alcohol or use illicit drugs. Patient is coming from home. Independent for most of her  ADL.  Allergies  Allergen Reactions  . Amoxicillin-Pot Clavulanate     Diarrhea ,fatigue mouth sores   . Doxycycline Hyclate   . Iodinated Diagnostic Agents     Full body rash   . Nitrofuran Derivatives   . Norvasc [Amlodipine Besylate]     Leg swelling  . Plavix [Clopidogrel Bisulfate]     REACTION: SOB, and rash  . Sulfa Antibiotics     And adhesive tape  . Phenergan [Promethazine Hcl] Anxiety    Family History  Problem Relation Age of Onset  . Rheum arthritis Mother     Prior to Admission medications   Medication Sig Start Date End Date Taking? Authorizing Provider  ALPRAZolam (XANAX) 0.25 MG tablet Take 1 tablet (0.25 mg total) by mouth at bedtime as needed for anxiety or sleep. 07/17/12  Yes Laveda Norman, MD  aspirin EC 81 MG tablet Take 81 mg by mouth at bedtime.    Yes Historical Provider, MD  furosemide (LASIX) 20 MG tablet Take 3 tablets (60 mg total) by mouth daily. 07/17/12  Yes Laveda Norman, MD  HYDROcodone-acetaminophen (LORTAB) 10-500 MG per tablet Take 1 tablet by mouth 2 (two) times daily as needed for pain.    Yes Historical Provider, MD  hydroxychloroquine (PLAQUENIL) 200 MG tablet Take 200 mg by mouth  2 (two) times daily.    Yes Historical Provider, MD  hydrOXYzine (ATARAX/VISTARIL) 10 MG tablet Take 1 tablet (10 mg total) by mouth every 6 (six) hours as needed for itching. 07/17/12  Yes Laveda Norman, MD  levalbuterol Pauline Aus) 0.63 MG/3ML nebulizer solution Take 3 mLs (0.63 mg total) by nebulization every 4 (four) hours as needed for wheezing or shortness of breath. 07/17/12  Yes Laveda Norman, MD  polyethylene glycol [facility] / GLYCOLAX) packet Take 17 g by mouth daily. 07/17/12  Yes Laveda Norman, MD   rOPINIRole (REQUIP) 0.5 MG tablet Take 0.5 mg by mouth 2 (two) times daily.     Yes Historical Provider, MD  simvastatin (ZOCOR) 20 MG tablet Take 20 mg by mouth at bedtime.  06/16/11  Yes Historical Provider, MD  spironolactone (ALDACTONE) 50 MG tablet Take 1 tablet (50 mg total) by mouth 2 (two) times daily. 07/17/12  Yes Laveda Norman, MD  warfarin (COUMADIN) 5 MG tablet Take 2.5-5 mg by mouth every evening. 0.5 tab daily except 1 tab on Mondays and Wednesdays. 05/08/11  Yes Historical Provider, MD  zinc oxide (BALMEX) 11.3 % CREA cream Apply 1 application topically 2 (two) times daily. For rash/diaper rash.   Yes Historical Provider, MD  ACCU-CHEK AVIVA PLUS test strip  12/04/11   Historical Provider, MD  Lancets (ACCU-CHEK MULTICLIX) lancets  12/04/11   Historical Provider, MD    Physical Exam: Filed Vitals:   10/02/12 1822 10/02/12 1830 10/02/12 1845 10/02/12 2115  BP: 143/80   138/67  Pulse: 74 71 73   Temp: 97.5 F (36.4 C)   98.1 F (36.7 C)  TempSrc: Oral   Oral  Resp: 20 18 13 20   SpO2: 97% 96% 96% 93%    General: Alert, Awake and Oriented to Time, Place and Person. Appear in mild distress Eyes: PERRL ENT: Oral Mucosa clear dry. Neck: no JVD, no Carotid Bruits  Cardiovascular: S1 and S2 Present, no Murmur, Peripheral Pulses Present Respiratory: Bilateral Air entry equal and Decreased, Clear to Auscultation,  no Crackles,no wheezes Abdomen: Bowel Sound Present, Soft and Non tender no Skin: no Rash Extremities: no Pedal edema, no calf tenderness Neurologic: Grossly Unremarkable.  Labs on Admission:  CBC:  Recent Labs Lab 10/02/12 1830  WBC 8.5  NEUTROABS 5.5  HGB 14.9  HCT 44.2  MCV 85.0  PLT 185    CMP     Component Value Date/Time   NA 130* 10/02/2012 2144   K 5.0 10/02/2012 2144   CL 93* 10/02/2012 2144   CO2 25 10/02/2012 2144   GLUCOSE 92 10/02/2012 2144   BUN 77* 10/02/2012 2144   CREATININE 2.13* 10/02/2012 2144   CALCIUM 9.9 10/02/2012 2144   PROT 8.4*  10/02/2012 2144   ALBUMIN 4.1 10/02/2012 2144   AST 34 10/02/2012 2144   ALT 27 10/02/2012 2144   ALKPHOS 92 10/02/2012 2144   BILITOT 0.6 10/02/2012 2144   GFRNONAA 21* 10/02/2012 2144   GFRAA 24* 10/02/2012 2144    No results found for this basename: LIPASE, AMYLASE,  in the last 168 hours No results found for this basename: AMMONIA,  in the last 168 hours  Cardiac Enzymes: No results found for this basename: CKTOTAL, CKMB, CKMBINDEX, TROPONINI,  in the last 168 hours  BNP (last 3 results)  Recent Labs  07/07/12 1038 07/10/12 1025 10/02/12 2144  PROBNP 21555.0* 28016.0* 3050.0*    Radiological Exams on Admission: No results found.  Assessment/Plan Principal Problem:  Acute on chronic renal failure Active Problems:   DIABETES MELLITUS, TYPE II   CORONARY ARTERY DISEASE   PULMONARY HYPERTENSION   INTERSTITIAL LUNG DISEASE   GERD   ARTHRITIS, RHEUMATOID   FIBROMYALGIA   OSA (obstructive sleep apnea)   Ascites   1. Acute on chronic renal failure More likely secondary to prerenal etiology due to continues use of diuretics despite being dehydrated. We will gently hydrate the patient considering her history of heart failure and pulmonary hypertension. I would hold Aldactone and Lasix, and restart them later tomorrow. His renal function does not get better with IV hydration, then further workup related to acute kidney injury should be obtaining.  2. diabetes mellitus Sliding scale 3. Pulmonary hypertension Oxygen as needed, gentle hydration at present  4. Atrial fibrillation Rate controlled continue Coumadin  5. possible UTI Continue ceftriaxone  DVT Prophylaxis: mechanical compression device Nutrition: Cardiac diet  Code Status: DO NOT RESUSCITATE  Family Communication: Daughter was at the bedside   Author: Lynden Oxford, MD [facility] Pager: [phone] 10/02/2012, 11:58 PM    If 7PM-7AM, please contact night-coverage [email or web address] Password [email or web address]

## 2012-10-02 NOTE — ED Notes (Signed)
Pt states that she had labs drawn today. States that she was told to come here for abnormal labs and renal failure. NAD at this time.

## 2012-10-02 NOTE — ED Notes (Signed)
Called lab to follow up on the delay in the CMP and BNP.  Informed by lab that the sample provided earlier did not contain enough blood.  Will attempted to redraw.

## 2012-10-02 NOTE — ED Provider Notes (Signed)
CSN: [id number]     Arrival date & time 10/02/12  1752 History   First MD Initiated Contact with Patient 10/02/12 1810     Chief Complaint  Patient presents with  . Abnormal Lab   (Consider location/radiation/quality/duration/timing/severity/associated sxs/prior Treatment) HPI Patient is a 79 year old female with history of coronary disease, pulmonary hypertension, congestive heart failure, renal insufficiency, liver cirrhosis who presents to emergency department with complaint of elevated creatinine from rheumatological office today. He should states she had her regular labs drawn at her rheumatologist today and was called this afternoon and was told that her kidneys were failing and was told to come straight to emergency department. Patient states that she recently has been feeling well, she has not had any problems with fatigue, urinary symptoms, respiratory symptoms, fever, chills, malaise. Patient states she is on hospice since her last admission which was at the beginning of June 2 months ago due to her multiple medical comorbidities but most significantly do to her pulmonary hypertension and liver failure. Patient states she was told she only had 3-5 months to live. Patient states however she has been doing really well at home, and states she has not had issues with her congestive heart failure or respiratory issues since discharge. Patient states although on hospice, patient wants everything done for her renal failure. Paperwork from her doctor's office was sent via fax. Creatinine today is 2.22, baseline 1.6 according to records.  Past Medical History  Diagnosis Date  . Myalgia and myositis, unspecified   . Esophageal reflux   . Degeneration of intervertebral disc, site unspecified   . Morbid obesity   . Osteoarthrosis, unspecified whether generalized or localized, unspecified site   . Rheumatoid arthritis(714.0)   . Type II or unspecified type diabetes mellitus without mention of  complication, not stated as uncontrolled   . CAD (coronary artery disease)   . Chronic kidney disease   . Thrombocytopenia    Past Surgical History  Procedure Laterality Date  . Appendectomy    . Rotator cuff repair    . Replacement total knee    . Tubal ligation    . Ankle fusion     Family History  Problem Relation Age of Onset  . Rheum arthritis Mother    History  Substance Use Topics  . Smoking status: Former Smoker -- 1.00 packs/day for 17 years    Types: Cigarettes    Quit date: 02/08/1977  . Smokeless tobacco: Never Used  . Alcohol Use: No   OB History   Grav Para Term Preterm Abortions TAB SAB Ect Mult Living                 Review of Systems  Constitutional: Negative for fever and chills.  HENT: Negative for neck pain and neck stiffness.   Respiratory: Negative for cough, chest tightness and shortness of breath.   Cardiovascular: Negative for chest pain, palpitations and leg swelling.  Gastrointestinal: Negative for nausea, vomiting, abdominal pain and diarrhea.  Genitourinary: Negative for dysuria, flank pain, vaginal bleeding, vaginal discharge, vaginal pain and pelvic pain.  Musculoskeletal: Negative for myalgias and arthralgias.  Skin: Negative for rash.  Neurological: Negative for dizziness, weakness and headaches.  All other systems reviewed and are negative.    Allergies  Amoxicillin-pot clavulanate; Doxycycline hyclate; Iodinated diagnostic agents; Nitrofuran derivatives; Norvasc; Plavix; Sulfa antibiotics; and Phenergan  Home Medications   Current Outpatient Rx  Name  Route  Sig  Dispense  Refill  . ALPRAZolam (XANAX) 0.25 MG tablet  Oral   Take 1 tablet (0.25 mg total) by mouth at bedtime as needed for anxiety or sleep.   30 tablet   0   . aspirin EC 81 MG tablet   Oral   Take 81 mg by mouth at bedtime.          . furosemide (LASIX) 20 MG tablet   Oral   Take 3 tablets (60 mg total) by mouth daily.   90 tablet   0   .  HYDROcodone-acetaminophen (LORTAB) 10-500 MG per tablet   Oral   Take 1 tablet by mouth 2 (two) times daily as needed for pain.          . hydroxychloroquine (PLAQUENIL) 200 MG tablet   Oral   Take 200 mg by mouth 2 (two) times daily.          . hydrOXYzine (ATARAX/VISTARIL) 10 MG tablet   Oral   Take 1 tablet (10 mg total) by mouth every 6 (six) hours as needed for itching.   60 tablet   0   . levalbuterol (XOPENEX) 0.63 MG/3ML nebulizer solution   Nebulization   Take 3 mLs (0.63 mg total) by nebulization every 4 (four) hours as needed for wheezing or shortness of breath.   3 mL   12   . polyethylene glycol (MIRALAX / GLYCOLAX) packet   Oral   Take 17 g by mouth daily.   14 each   0   . rOPINIRole (REQUIP) 0.5 MG tablet   Oral   Take 0.5 mg by mouth 2 (two) times daily.           . simvastatin (ZOCOR) 20 MG tablet   Oral   Take 20 mg by mouth at bedtime.          Marland Kitchen spironolactone (ALDACTONE) 50 MG tablet   Oral   Take 1 tablet (50 mg total) by mouth 2 (two) times daily.   60 tablet   0   . warfarin (COUMADIN) 5 MG tablet   Oral   Take 2.5-5 mg by mouth daily. Take 1 tab MWF, 1/2 tab all others         . zinc oxide (BALMEX) 11.3 % CREA cream   Topical   Apply 1 application topically 2 (two) times daily.         Marland Kitchen ACCU-CHEK AVIVA PLUS test strip               . Lancets (ACCU-CHEK MULTICLIX) lancets                BP 143/80  Pulse 74  Temp(Src) 97.5 F (36.4 C) (Oral)  Resp 20  SpO2 97% Physical Exam  Nursing note and vitals reviewed. Constitutional: She appears well-developed and well-nourished. No distress.  HENT:  Head: Normocephalic.  Oral mucosa dry  Eyes: Conjunctivae are normal.  Neck: Neck supple.  Cardiovascular: Normal rate, regular rhythm and normal heart sounds.   Pulmonary/Chest: Effort normal and breath sounds normal. No respiratory distress. She has no wheezes. She has no rales.  Abdominal: Soft. Bowel sounds are  normal. She exhibits no distension. There is no tenderness. There is no rebound.  Musculoskeletal: She exhibits no edema.  Neurological: She is alert.  Skin: Skin is warm and dry. There is pallor.  Psychiatric: She has a normal mood and affect. Her behavior is normal.    ED Course  Procedures (including critical care time) Labs Review Labs Reviewed  CBC WITH DIFFERENTIAL - Abnormal;  Notable for the following:    RBC 5.20 (*)    RDW 16.2 (*)    All other components within normal limits  URINALYSIS W MICROSCOPIC + REFLEX CULTURE - Abnormal; Notable for the following:    Leukocytes, UA MODERATE (*)    All other components within normal limits  PROTIME-INR - Abnormal; Notable for the following:    Prothrombin Time 20.7 (*)    INR 1.84 (*)    All other components within normal limits  COMPREHENSIVE METABOLIC PANEL - Abnormal; Notable for the following:    Sodium 130 (*)    Chloride 93 (*)    BUN 77 (*)    Creatinine, Ser 2.13 (*)    Total Protein 8.4 (*)    GFR calc non Af Amer 21 (*)    GFR calc Af Amer 24 (*)    All other components within normal limits  PRO B NATRIURETIC PEPTIDE - Abnormal; Notable for the following:    Pro B Natriuretic peptide (BNP) 3050.0 (*)    All other components within normal limits   Imaging Review No results found.  MDM   1. UTI (lower urinary tract infection)   2. Acute renal failure   Patient with elevated creatinine coming from her rheumatologist office. She is a hospice patient. She states she has been doing very well.  On exam patient appears dry. Her labs here showed that her creatinine is 2.13 with BUN of 77. Her sodium is 1:30. This is all consistent with dehydration. She does have history of significant CHF and was rehydrated slowly with initial bolus of 250 mL of normal saline and then 500 mL of normal saline. She also appears to have a UTI with moderate leukocyte Estrace and 11-20 white blood cells in her urine. Rocephin started. Patient  will most likely need admission for rehydration IV antibiotics and to make sure her creatinine continues to improve. Patient otherwise in no distress vital signs are normal. No signs of sepsis.  Filed Vitals:   10/02/12 1822 10/02/12 1830 10/02/12 1845 10/02/12 2115  BP: 143/80   138/67  Pulse: 74 71 73   Temp: 97.5 F (36.4 C)   98.1 F (36.7 C)  TempSrc: Oral   Oral  Resp: 20 18 13 20   SpO2: 97% 96% 96% 93%      Valita Righter A Fallynn Gravett, PA-C 10/03/12 0104

## 2012-10-03 ENCOUNTER — Inpatient Hospital Stay (HOSPITAL_COMMUNITY): Payer: Medicare Other

## 2012-10-03 DIAGNOSIS — K7460 Unspecified cirrhosis of liver: Secondary | ICD-10-CM

## 2012-10-03 DIAGNOSIS — N189 Chronic kidney disease, unspecified: Secondary | ICD-10-CM

## 2012-10-03 DIAGNOSIS — R188 Other ascites: Secondary | ICD-10-CM

## 2012-10-03 DIAGNOSIS — N390 Urinary tract infection, site not specified: Secondary | ICD-10-CM

## 2012-10-03 DIAGNOSIS — N179 Acute kidney failure, unspecified: Principal | ICD-10-CM

## 2012-10-03 LAB — CBC WITH DIFFERENTIAL/PLATELET
Basophils Absolute: 0 10*3/uL (ref 0.0–0.1)
Basophils Relative: 0 % (ref 0–1)
MCHC: 34 g/dL (ref 30.0–36.0)
Neutro Abs: 5.4 10*3/uL (ref 1.7–7.7)
Neutrophils Relative %: 63 % (ref 43–77)
Platelets: 161 10*3/uL (ref 150–400)
RDW: 16.2 % — ABNORMAL HIGH (ref 11.5–15.5)
WBC: 8.6 10*3/uL (ref 4.0–10.5)

## 2012-10-03 LAB — GLUCOSE, CAPILLARY
Glucose-Capillary: 75 mg/dL (ref 70–99)
Glucose-Capillary: 84 mg/dL (ref 70–99)

## 2012-10-03 LAB — COMPREHENSIVE METABOLIC PANEL
ALT: 21 U/L (ref 0–35)
AST: 29 U/L (ref 0–37)
Albumin: 3.3 g/dL — ABNORMAL LOW (ref 3.5–5.2)
Chloride: 98 mEq/L (ref 96–112)
Creatinine, Ser: 2.15 mg/dL — ABNORMAL HIGH (ref 0.50–1.10)
Potassium: 4.7 mEq/L (ref 3.5–5.1)
Sodium: 133 mEq/L — ABNORMAL LOW (ref 135–145)
Total Bilirubin: 0.4 mg/dL (ref 0.3–1.2)

## 2012-10-03 LAB — PROTIME-INR
INR: 2 — ABNORMAL HIGH (ref 0.00–1.49)
Prothrombin Time: 22.1 seconds — ABNORMAL HIGH (ref 11.6–15.2)

## 2012-10-03 MED ORDER — INSULIN ASPART 100 UNIT/ML ~~LOC~~ SOLN
0.0000 [IU] | Freq: Three times a day (TID) | SUBCUTANEOUS | Status: DC
  Administered 2012-10-03: 3 [IU] via SUBCUTANEOUS
  Administered 2012-10-04: 2 [IU] via SUBCUTANEOUS

## 2012-10-03 MED ORDER — WARFARIN SODIUM 5 MG PO TABS
5.0000 mg | ORAL_TABLET | Freq: Once | ORAL | Status: AC
  Administered 2012-10-03: 5 mg via ORAL
  Filled 2012-10-03: qty 1

## 2012-10-03 MED ORDER — ALBUTEROL SULFATE (5 MG/ML) 0.5% IN NEBU: 2.5000 mg | INHALATION_SOLUTION | RESPIRATORY_TRACT | Status: DC | PRN

## 2012-10-03 MED ORDER — ONDANSETRON HCL 4 MG PO TABS: 4.0000 mg | ORAL_TABLET | Freq: Four times a day (QID) | ORAL | Status: DC | PRN

## 2012-10-03 MED ORDER — WARFARIN - PHARMACIST DOSING INPATIENT: Freq: Every day | Status: DC

## 2012-10-03 MED ORDER — HYDROCODONE-ACETAMINOPHEN 5-325 MG PO TABS
1.0000 | ORAL_TABLET | ORAL | Status: DC | PRN
  Administered 2012-10-03 – 2012-10-04 (×2): 1 via ORAL
  Filled 2012-10-03 (×2): qty 1

## 2012-10-03 MED ORDER — ROPINIROLE HCL 0.5 MG PO TABS
0.5000 mg | ORAL_TABLET | Freq: Two times a day (BID) | ORAL | Status: DC
  Administered 2012-10-03 – 2012-10-04 (×4): 0.5 mg via ORAL
  Filled 2012-10-03 (×5): qty 1

## 2012-10-03 MED ORDER — SIMVASTATIN 20 MG PO TABS
20.0000 mg | ORAL_TABLET | Freq: Every day | ORAL | Status: DC
  Administered 2012-10-03 (×2): 20 mg via ORAL
  Filled 2012-10-03 (×3): qty 1

## 2012-10-03 MED ORDER — ASPIRIN EC 81 MG PO TBEC
81.0000 mg | DELAYED_RELEASE_TABLET | Freq: Every day | ORAL | Status: DC
  Administered 2012-10-03 (×2): 81 mg via ORAL
  Filled 2012-10-03 (×3): qty 1

## 2012-10-03 MED ORDER — INSULIN ASPART 100 UNIT/ML ~~LOC~~ SOLN: 0.0000 [IU] | Freq: Every day | SUBCUTANEOUS | Status: DC

## 2012-10-03 MED ORDER — HYDROXYZINE HCL 10 MG PO TABS
10.0000 mg | ORAL_TABLET | Freq: Four times a day (QID) | ORAL | Status: DC | PRN
  Administered 2012-10-04: 10 mg via ORAL
  Filled 2012-10-03: qty 1

## 2012-10-03 MED ORDER — ONDANSETRON HCL 4 MG/2ML IJ SOLN
4.0000 mg | Freq: Four times a day (QID) | INTRAMUSCULAR | Status: DC | PRN
  Administered 2012-10-03: 4 mg via INTRAVENOUS
  Filled 2012-10-03: qty 2

## 2012-10-03 MED ORDER — VITAMINS A & D EX OINT
TOPICAL_OINTMENT | CUTANEOUS | Status: AC
  Administered 2012-10-03: 11:00:00
  Filled 2012-10-03: qty 5

## 2012-10-03 NOTE — Progress Notes (Signed)
[facility]: Sw note  Sw reviewed Pt's medical chart. Pt in bed, complaints voiced of pain in her legs and being hungry "because I am having a test done I can't eat yet." Pt stated that her goal is to return to her home. Prior to admit Pt lived alone at home, had paid caregivers in the home several shifts a day. Pt requires a hoyer lift to get in/out of bed at home and spends her day in her wheelchair or bed. Pt's local son lives in [place], [place] he is currently on a business trip to [place], his wife is aware of Pt's admission to the hospital. Sw will continue to follow for support during hospital stay.  [address] [place], [facility]-SW [phone]

## 2012-10-03 NOTE — Progress Notes (Signed)
Patient states she feels she has lost a lot of weight since her last hospital stay which was in June 2014.  Reviewed last admission weight 6/14 = 242, today weighs = 177.32 a difference of 65lbs lost in last 2 months.  Ordered a nutritional consult.

## 2012-10-03 NOTE — Progress Notes (Signed)
Patient scheduled for an abdominal u/s so instructed to be NPO.  Applied bilateral alleyn dressings to heals for comfort which are sore per patient  and slightly red.

## 2012-10-03 NOTE — Progress Notes (Signed)
ANTICOAGULATION CONSULT NOTE - Initial Consult  Pharmacy Consult for warfarin Indication: atrial fibrillation  Allergies  Allergen Reactions  . Amoxicillin-Pot Clavulanate     Diarrhea ,fatigue mouth sores   . Doxycycline Hyclate   . Iodinated Diagnostic Agents     Full body rash   . Nitrofuran Derivatives   . Norvasc [Amlodipine Besylate]     Leg swelling  . Plavix [Clopidogrel Bisulfate]     REACTION: SOB, and rash  . Sulfa Antibiotics     And adhesive tape  . Phenergan [Promethazine Hcl] Anxiety    Patient Measurements: Height: 5\' 2"  (157.5 cm) IBW/kg (Calculated) : 50.1 Heparin Dosing Weight:   Vital Signs: Temp: 98.1 F (36.7 C) (08/25 2115) Temp src: Oral (08/25 2115) BP: 138/67 mmHg (08/25 2115) Pulse Rate: 73 (08/25 1845)  Labs:  Recent Labs  10/02/12 1830 10/02/12 2144  HGB 14.9  --   HCT 44.2  --   PLT 185  --   LABPROT 20.7*  --   INR 1.84*  --   CREATININE  --  2.13*    The CrCl is unknown because both a height and weight (above a minimum accepted value) are required for this calculation.   Medical History: Past Medical History  Diagnosis Date  . Myalgia and myositis, unspecified   . Esophageal reflux   . Degeneration of intervertebral disc, site unspecified   . Morbid obesity   . Osteoarthrosis, unspecified whether generalized or localized, unspecified site   . Rheumatoid arthritis(714.0)   . Type II or unspecified type diabetes mellitus without mention of complication, not stated as uncontrolled   . CAD (coronary artery disease)   . Chronic kidney disease   . Thrombocytopenia     Medications:  Scheduled:  . aspirin EC  81 mg Oral QHS  . rOPINIRole  0.5 mg Oral BID  . simvastatin  20 mg Oral QHS  . warfarin  5 mg Oral ONCE-1800  . Warfarin - Pharmacist Dosing Inpatient   Does not apply q1800    Assessment: Patient on chronic warfarin for Afib.  INR < 2.   Goal of Therapy:  INR 2-3    Plan:  Warfarin 5mg  po x1 at  1800 Daily INR  Aleene Davidson Crowford 10/03/2012,3:20 AM

## 2012-10-03 NOTE — Progress Notes (Signed)
Upon review of orders patient was admitted earlier this morning by my associate. Please review his H and P for details regarding Assessment and Plan.  Renal u/s reviewed and reported as no renal abnormalities.  We will f/u next am.  Repeat BMP and place order for urine sodium and urine creatinine for Fe Na calculation.  Rayann Jolley, [employer]

## 2012-10-03 NOTE — Progress Notes (Addendum)
Room WL 1504-Aundrea Waynette Buttery - [facility] RN Visit-R.Rael Tilly RN  NON-RELATED admission to [facility] diagnosis of pulmonary fibrosis.  Pt is DNR code.    Pt alert & oriented, lying in bed, with complaints of discomfort throughtout the abdomen.    No family present. Patient's home medication list is on shadow chart.   Pt adm following physician visit with elevated creatinine, BUN.  Please call [facility] @ [phone]- ask for RN Liaison or after hours,ask for on-call RN with any hospice needs.   Thank you.  Joneen Boers, RN  [facility]  Hospice Liaison

## 2012-10-04 DIAGNOSIS — M069 Rheumatoid arthritis, unspecified: Secondary | ICD-10-CM

## 2012-10-04 LAB — BASIC METABOLIC PANEL
BUN: 58 mg/dL — ABNORMAL HIGH (ref 6–23)
Calcium: 9 mg/dL (ref 8.4–10.5)
GFR calc non Af Amer: 34 mL/min — ABNORMAL LOW (ref >90)
Glucose, Bld: 102 mg/dL — ABNORMAL HIGH (ref 70–99)
Sodium: 136 mEq/L (ref 135–145)

## 2012-10-04 LAB — GLUCOSE, CAPILLARY
Glucose-Capillary: 89 mg/dL (ref 70–99)
Glucose-Capillary: 149 mg/dL — ABNORMAL HIGH (ref 70–99)

## 2012-10-04 LAB — SODIUM, URINE, RANDOM: Sodium, Ur: 59 mEq/L

## 2012-10-04 MED ORDER — WARFARIN SODIUM 2.5 MG PO TABS
2.5000 mg | ORAL_TABLET | Freq: Once | ORAL | Status: DC
  Filled 2012-10-04: qty 1

## 2012-10-04 MED ORDER — FUROSEMIDE 20 MG PO TABS: 20.0000 mg | ORAL_TABLET | Freq: Every day | ORAL | Status: AC

## 2012-10-04 MED ORDER — SPIRONOLACTONE 50 MG PO TABS: 50.0000 mg | ORAL_TABLET | Freq: Every day | ORAL | Status: DC

## 2012-10-04 NOTE — Progress Notes (Signed)
Room WL 1504 - Ledell Noss - [facility] RN Visit-R.Cheyanna Strick RN  NON-RELATED admission to [facility] diagnosis of pulm fibrosis.   Pt is DNR code.    Pt alert & oriented (answers all questions appropriately),reclined in lounge chair with legs elevated,  without complaints of pain or discomfort.    No family present.  Pt discussed several issues, wanted ice, wants to discuss her current medical situation with the physician and get results of labs and plan for her.  Pt concerned about skin breakdown as she is bedridden and used to wearing briefs-per staff RN, herr dtr in law is bringing briefs to her for use here in hospital.  Discussed with staff RN, pt may be discharged today.  Patient's home medication list is on shadow chart.   Please call [facility] @ [phone]- ask for RN Liaison or after hours,ask for on-call RN with any hospice needs.   Thank you.  Joneen Boers, RN  [facility]  Hospice Liaison  [phone])

## 2012-10-04 NOTE — Progress Notes (Signed)
ANTICOAGULATION CONSULT NOTE - Follow Up Consult  Pharmacy Consult for Warfarin Indication: atrial fibrillation  Allergies  Allergen Reactions  . Amoxicillin-Pot Clavulanate     Diarrhea ,fatigue mouth sores   . Doxycycline Hyclate   . Iodinated Diagnostic Agents     Full body rash   . Nitrofuran Derivatives   . Norvasc [Amlodipine Besylate]     Leg swelling  . Plavix [Clopidogrel Bisulfate]     REACTION: SOB, and rash  . Sulfa Antibiotics     And adhesive tape  . Phenergan [Promethazine Hcl] Anxiety   Patient Measurements: Height: 5\' 5"  (165.1 cm) Weight: 181 lb 14.1 oz (82.5 kg) IBW/kg (Calculated) : 57  Vital Signs: Temp src: Oral (08/27 0624) BP: 119/71 mmHg (08/27 0624)  Labs:  Recent Labs  10/02/12 1830 10/02/12 2144 10/03/12 0520 10/04/12 0432  HGB 14.9  --  12.9  --   HCT 44.2  --  37.9  --   PLT 185  --  161  --   LABPROT 20.7*  --  22.1* 22.2*  INR 1.84*  --  2.00* 2.02*  CREATININE  --  2.13* 2.15* 1.45*   Estimated Creatinine Clearance: 33.9 ml/min (by C-G formula based on Cr of 1.45).  Medications:  Scheduled:  . aspirin EC  81 mg Oral QHS  . insulin aspart  0-15 Units Subcutaneous TID WC  . insulin aspart  0-5 Units Subcutaneous QHS  . rOPINIRole  0.5 mg Oral BID  . simvastatin  20 mg Oral QHS  . Warfarin - Pharmacist Dosing Inpatient   Does not apply q1800   Assessment: 86 yoF sent to ED with elevated SCr from Rheumatologist office. Chronic Warfarin for Afib; home dose 2.5mg  daily except 5 mg M/W. Last dose 8/24  INR 2.02 today after 5 mg warfarin 8/26  Goal of Therapy:  INR 2-3   Plan:   Daily Protime/INR  Warfarin 2.5mg  today @ 1800  Otho Bellows PharmD Pager [phone] 10/04/2012, 11:28 AM

## 2012-10-04 NOTE — Progress Notes (Signed)
Nutrition Brief Note  Pt meets criteria for severe MALNUTRITION in the context of chronic illness as evidenced by at least 5.7% weight loss (not fluid but actual weight per pt) in the past 2 months in addition to pt consuming <50-75% estimated energy intake in the past month.  Patient identified on the Malnutrition Screening Tool (MST) Report  Wt Readings from Last 15 Encounters:  10/04/12 181 lb 14.1 oz (82.5 kg)  07/17/12 242 lb (109.77 kg)  12/18/10 240 lb (108.863 kg)  11/20/08 253 lb (114.76 kg)    Body mass index is 30.27 kg/(m^2). Patient meets criteria for class I obesity based on current BMI.   Current diet order is heart healthy, patient is consuming approximately 50% of meals at this time. Labs and medications reviewed. BUN/Cr elevated but trending down. Pt under hospice care PTA for pulmonary fibrosis. Met with pt who reports 50 pounds of the unintended weight loss from 242 pounds to 181 pounds in the past 2 months has been from fluid. Pt reports poor intake at home, only several small meals of food, otherwise she will vomit if she consumes larger portions. Pt c/o no appetite. Willing to try Boost at home and high calorie/protein small meals/snacks discussed. Noted pt to be d/c today. Answered pt's questions about meals that are low sodium/diabetic friendly that can be delivered to pt's house - provided pt with a list of companies that have these services. Pt appreciative.   No nutrition interventions warranted at this time. If nutrition issues arise, please consult RD.   Levon Hedger MS, RD, LDN [phone] Pager [phone] After Hours Pager

## 2012-10-04 NOTE — Discharge Summary (Signed)
Physician Discharge Summary  Holly [name] ZOX:[id number] DOB: 02/06/1932 DOA: 10/02/2012  PCP: Holly Alken, MD  Admit date: 10/02/2012 Discharge date: 10/04/2012  Time spent: 45 minutes  Recommendations for Outpatient Follow-up:  -Advised to follow up with her PCP in 5 days for repeat renal function. -Lasix has been decreased from 60 mg to 20 mg daily and spironolactone from 50 mg BID to daily 2/2 ARF.   Discharge Diagnoses:  Principal Problem:   Acute on chronic renal failure Active Problems:   DIABETES MELLITUS, TYPE II   CORONARY ARTERY DISEASE   PULMONARY HYPERTENSION   INTERSTITIAL LUNG DISEASE   GERD   ARTHRITIS, RHEUMATOID   FIBROMYALGIA   OSA (obstructive sleep apnea)   Ascites   Discharge Condition: Stable and improved  Filed Weights   10/03/12 0949 10/04/12 0624  Weight: 80.6 kg (177 lb 11.1 oz) 82.5 kg (181 lb 14.1 oz)    History of present illness:  Holly [name] is a 79 y.o. female with Past medical history of pulmonary hypertension, chronic kidney disease, obstructive sleep apnea, ascites, diabetes, rheumatoid arthritis, who is on hospice.  Today she went to see her rheumatologist who obtained her labs and found that she had acute kidney injury, recommended her to follow up with her PCP whom after obtaining the labs again referred her for admission.   Hospital Course:   ARF -Cr has decreased from 2.15 on admission to 1.45 on day of DC. -Suspect related to diuretic regimen for her ESLD on top of decreased PO fluid intake. -Have decreased lasix from 60 mg daily to 20 mg daily and spironolactone from 50 mg BID to daily. -Will need close follow up with PCP as an OP to further balance diuretic regimen with renal function. -Renal US without acute abnormalities.  Rest of medical issues have been stable this hospitalization.  Procedures:  None   Consultations:  None  Discharge Instructions  Discharge Orders   Future Appointments Provider  Department Dept Phone   12/20/2012 2:15 PM Barbaraann Share, MD [facility] [phone]   Future Orders Complete By Expires   Diet - low sodium heart healthy  As directed    Discontinue IV  As directed    Increase activity slowly  As directed        Medication List         ACCU-CHEK AVIVA PLUS test strip  Generic drug:  glucose blood     accu-chek multiclix lancets     ALPRAZolam 0.25 MG tablet  Commonly known as:  XANAX  Take 1 tablet (0.25 mg total) by mouth at bedtime as needed for anxiety or sleep.     aspirin EC 81 MG tablet  Take 81 mg by mouth at bedtime.     furosemide 20 MG tablet  Commonly known as:  LASIX  Take 1 tablet (20 mg total) by mouth daily.     HYDROcodone-acetaminophen 10-500 MG per tablet  Commonly known as:  LORTAB  Take 1 tablet by mouth 2 (two) times daily as needed for pain.     hydroxychloroquine 200 MG tablet  Commonly known as:  PLAQUENIL  Take 200 mg by mouth 2 (two) times daily.     hydrOXYzine 10 MG tablet  Commonly known as:  ATARAX/VISTARIL  Take 1 tablet (10 mg total) by mouth every 6 (six) hours as needed for itching.     levalbuterol 0.63 MG/3ML nebulizer solution  Commonly known as:  XOPENEX  Take 3 mLs (0.63  mg total) by nebulization every 4 (four) hours as needed for wheezing or shortness of breath.     polyethylene glycol packet  Commonly known as:  MIRALAX / GLYCOLAX  Take 17 g by mouth daily.     rOPINIRole 0.5 MG tablet  Commonly known as:  REQUIP  Take 0.5 mg by mouth 2 (two) times daily.     simvastatin 20 MG tablet  Commonly known as:  ZOCOR  Take 20 mg by mouth at bedtime.     spironolactone 50 MG tablet  Commonly known as:  ALDACTONE  Take 1 tablet (50 mg total) by mouth daily.     warfarin 5 MG tablet  Commonly known as:  COUMADIN  Take 2.5-5 mg by mouth every evening. 0.5 tab daily except 1 tab on Mondays and Wednesdays.     zinc oxide 11.3 % Crea cream  Commonly known as:  BALMEX  Apply 1  application topically 2 (two) times daily. For rash/diaper rash.       Allergies  Allergen Reactions  . Amoxicillin-Pot Clavulanate     Diarrhea ,fatigue mouth sores   . Doxycycline Hyclate   . Iodinated Diagnostic Agents     Full body rash   . Nitrofuran Derivatives   . Norvasc [Amlodipine Besylate]     Leg swelling  . Plavix [Clopidogrel Bisulfate]     REACTION: SOB, and rash  . Sulfa Antibiotics     And adhesive tape  . Phenergan [Promethazine Hcl] Anxiety       Follow-up Information   Follow up with Holly Alken, MD. Schedule an appointment as soon as possible for a visit in 5 days.   Specialty:  Family Medicine   Contact information:   [address] [place] [address] [phone]        The results of significant diagnostics from this hospitalization (including imaging, microbiology, ancillary and laboratory) are listed below for reference.    Significant Diagnostic Studies: US Renal  10/03/2012   *RADIOLOGY REPORT*  Clinical Data: History of acute renal insufficiency.  History of ascites.  RENAL/URINARY TRACT ULTRASOUND COMPLETE  Comparison:  CT. 07/10/2012. Ultrasound 07/07/2012.  Findings: Some details are compromised by bowel gas.  Right Kidney:  Right renal length is 10.6 cm.  Left Kidney:  Left renal length is 10.0 cm.  Examination of each kidney shows no evidence of hydronephrosis, solid or cystic mass, calculus, parenchymal loss, or parenchymal textural abnormality.  Bladder:  No urinary bladder evident.  No ascites is evident.  IMPRESSION: No renal abnormalities are identified.   Original Report Authenticated By: Onalee Hua Call    Microbiology: No results found for this or any previous visit (from the past 240 hour(s)).   Labs: Basic Metabolic Panel:  Recent Labs Lab 10/02/12 2144 10/03/12 0520 10/04/12 0432  NA 130* 133* 136  K 5.0 4.7 4.6  CL 93* 98 105  CO2 25 23 23   GLUCOSE 92 85 102*  BUN 77* 75* 58*  CREATININE 2.13* 2.15*  1.45*  CALCIUM 9.9 9.3 9.0   Liver Function Tests:  Recent Labs Lab 10/02/12 2144 10/03/12 0520  AST 34 29  ALT 27 21  ALKPHOS 92 73  BILITOT 0.6 0.4  PROT 8.4* 7.0  ALBUMIN 4.1 3.3*   No results found for this basename: LIPASE, AMYLASE,  in the last 168 hours No results found for this basename: AMMONIA,  in the last 168 hours CBC:  Recent Labs Lab 10/02/12 1830 10/03/12 0520  WBC 8.5 8.6  NEUTROABS 5.5 5.4  HGB 14.9 12.9  HCT 44.2 37.9  MCV 85.0 84.6  PLT 185 161   Cardiac Enzymes: No results found for this basename: CKTOTAL, CKMB, CKMBINDEX, TROPONINI,  in the last 168 hours BNP: BNP (last 3 results)  Recent Labs  07/07/12 1038 07/10/12 1025 10/02/12 2144  PROBNP 21555.0* 28016.0* 3050.0*   CBG:  Recent Labs Lab 10/03/12 0827 10/03/12 1210 10/03/12 1650 10/03/12 2212 10/04/12 0737  GLUCAP 85 165* 75 84 89       Signed:  HERNANDEZ ACOSTA,Mahamed Zalewski  [facility] Pager: [phone] 10/04/2012, 11:56 AM

## 2012-10-05 LAB — URINE CULTURE

## 2012-10-05 NOTE — ED Provider Notes (Signed)
Medical screening examination/treatment/procedure(s) were conducted as a shared visit with non-physician practitioner(s) and myself.  I personally evaluated the patient during the encounter  Patient with hx of RF, CAD, CKD recent plans for hospice presenting with lab abnormalities from her rheumatologist's office. Patient without fever, vomiting. Lab there c/w ARF, repeated here with similar. Also has UTI. Rocephin given. Gently hydrated with hx of CHF. Admitted.   Dagmar Hait, MD 10/05/12 [phone]

## 2012-11-09 ENCOUNTER — Telehealth: Payer: Self-pay | Admitting: Cardiology

## 2012-11-09 NOTE — Telephone Encounter (Signed)
New problem    Need an order for next INR to be drawn  at home.

## 2012-11-10 NOTE — Telephone Encounter (Signed)
Verbal order given to recheck PT/INR on 11/23/12 for patient Holly [name] and call/fax results to our office. Numbers given to nurse.   By Lou Miner, RPH

## 2012-11-22 ENCOUNTER — Ambulatory Visit (INDEPENDENT_AMBULATORY_CARE_PROVIDER_SITE_OTHER): Payer: Medicare Other | Admitting: Pharmacist

## 2012-11-22 DIAGNOSIS — I482 Chronic atrial fibrillation, unspecified: Secondary | ICD-10-CM | POA: Insufficient documentation

## 2012-11-22 DIAGNOSIS — I4891 Unspecified atrial fibrillation: Secondary | ICD-10-CM

## 2012-11-29 ENCOUNTER — Ambulatory Visit (INDEPENDENT_AMBULATORY_CARE_PROVIDER_SITE_OTHER): Payer: Medicare Other | Admitting: Internal Medicine

## 2012-11-29 DIAGNOSIS — I4891 Unspecified atrial fibrillation: Secondary | ICD-10-CM

## 2012-11-29 LAB — POCT INR: INR: 2

## 2012-12-06 ENCOUNTER — Ambulatory Visit (INDEPENDENT_AMBULATORY_CARE_PROVIDER_SITE_OTHER): Payer: Medicare Other | Admitting: Pharmacist

## 2012-12-06 DIAGNOSIS — I4891 Unspecified atrial fibrillation: Secondary | ICD-10-CM

## 2012-12-15 ENCOUNTER — Ambulatory Visit (INDEPENDENT_AMBULATORY_CARE_PROVIDER_SITE_OTHER): Payer: Medicare Other | Admitting: Pharmacist

## 2012-12-15 DIAGNOSIS — I4891 Unspecified atrial fibrillation: Secondary | ICD-10-CM

## 2012-12-20 ENCOUNTER — Ambulatory Visit (INDEPENDENT_AMBULATORY_CARE_PROVIDER_SITE_OTHER): Admitting: Pulmonary Disease

## 2012-12-20 ENCOUNTER — Encounter: Payer: Self-pay | Admitting: Pulmonary Disease

## 2012-12-20 ENCOUNTER — Encounter (INDEPENDENT_AMBULATORY_CARE_PROVIDER_SITE_OTHER): Payer: Self-pay

## 2012-12-20 VITALS — BP 122/86 | HR 62 | Temp 97.8°F

## 2012-12-20 DIAGNOSIS — J8410 Pulmonary fibrosis, unspecified: Secondary | ICD-10-CM

## 2012-12-20 NOTE — Patient Instructions (Signed)
Continue your oxygen at night while sleeping, and as needed during the day. Make sure you get your flu shot soon. followup with me in 6mos, but call if having worsening breathing issues.

## 2012-12-20 NOTE — Progress Notes (Signed)
  Subjective:    Patient ID: Holly [name], female    DOB: 08-Apr-1931, 79 y.o.   MRN: [id number]  HPI Patient comes in today for followup of her known interstitial lung disease, felt secondary to her rheumatoid arthritis.  She was in the hospital late summer with acute renal failure and found to have cirrhosis of unknown origin?  She had significant ascites.  From a breathing standpoint she has remained fairly stable, and is currently using oxygen at night and p.r.n. During the day.  She is currently on hospice, but feels that she is actually improved overall.  She denies any significant chest congestion or purulence.   Review of Systems  Constitutional: Negative for fever and unexpected weight change.  HENT: Negative for congestion, dental problem, ear pain, nosebleeds, postnasal drip, rhinorrhea, sinus pressure, sneezing, sore throat and trouble swallowing.   Eyes: Negative for redness and itching.  Respiratory: Negative for cough, chest tightness, shortness of breath and wheezing.   Cardiovascular: Negative for palpitations and leg swelling.  Gastrointestinal: Negative for nausea and vomiting.  Genitourinary: Negative for dysuria.  Musculoskeletal: Negative for joint swelling.  Skin: Negative for rash.  Neurological: Negative for headaches.  Hematological: Does not bruise/bleed easily.  Psychiatric/Behavioral: Negative for dysphoric mood. The patient is not nervous/anxious.        Objective:   Physical Exam Morbidly obese female in a wheelchair, in no acute distress Nose without purulence or discharge noted Neck without lymphadenopathy or thyromegaly Chest with basilar crackles one third of the way up bilaterally, no wheezing Cardiac exam with regular rate and rhythm Lower extremities with significant edema, no cyanosis Alert and oriented, moves all 4 extremities.       Assessment & Plan:

## 2012-12-20 NOTE — Assessment & Plan Note (Signed)
The pt appears to be stable from a pulmonary standpoint.  I suspect she does have ISLD related to her RA.  She really is not a good candidate for more aggressive immunosuppression such as cellcept or imuran.  I would like to treat her conservatively with oxygen as needed, and for her to watch her fluid balance carefully.

## 2012-12-26 ENCOUNTER — Ambulatory Visit (INDEPENDENT_AMBULATORY_CARE_PROVIDER_SITE_OTHER): Admitting: Cardiovascular Disease

## 2012-12-26 DIAGNOSIS — I4891 Unspecified atrial fibrillation: Secondary | ICD-10-CM

## 2012-12-26 LAB — POCT INR: INR: 2.9

## 2012-12-27 ENCOUNTER — Encounter: Payer: Self-pay | Admitting: Nurse Practitioner

## 2013-01-01 ENCOUNTER — Encounter: Payer: Self-pay | Admitting: Nurse Practitioner

## 2013-01-01 ENCOUNTER — Ambulatory Visit (INDEPENDENT_AMBULATORY_CARE_PROVIDER_SITE_OTHER): Admitting: Nurse Practitioner

## 2013-01-01 VITALS — BP 118/60 | HR 70 | Ht 62.0 in

## 2013-01-01 DIAGNOSIS — K7460 Unspecified cirrhosis of liver: Secondary | ICD-10-CM

## 2013-01-01 NOTE — Progress Notes (Signed)
History of Present Illness:   Patient is a 79 year old female with multiple, significant medical problems. She is on multiple medications including chronic coumadin. We saw the patient in the hospital in June for coffee ground emesis after she was admitted with nausea, vomiting, acute on chronic renal failure, questionable aspiration and a UTI. Her INR was 3.3 on admission.  Plain abdominal films were negative for obstruction. CT scan revealed cirrhosis and large volume ascites. On her exam her stool was light brown, hemoglobin was stable. EGD was not pursued given acute medical issues. Bleeding was felt to be low volume, possibly related to erosive disease. During that admission patient had a large volume paracentesis yielding 5.6 L of fluid. No evidence for spontaneous bacterial peritonitis. Her SAAG was c/w portal HTN. Hepatitis B and C serologies negative.  Patient was rehospitalized in late August of this year for recurrent acute kidney injury found on outpatient labs. Her diuretic dosages were reduced at time of discharge. Follow up creatinine at PCPs office 11/10/12 significantly improved at 1.45.  Patient comes in today really just to discuss her prognosis. Patient and family were told by someone in the hospital that her life expectancy was about 4 months. This turned her world upside down. Patient wants to know what to "look for" with end stage liver disease.  I reviewed labs from PCP dated 12/27/11. White count normal. Hemoglobin 12, platelets 103. Hospice is monitoring INR.  She hasn't had any significant nausea or vomiting. No abdominal pain. Home aides assist her with bowel cleanup so she isn't sure if there has been any overt bleeding.   Current Medications, Allergies, Past Medical History, Past Surgical History, Family History and Social History were reviewed in [employer] record.  Physical Exam: General: Pleasant, obese, white female in a wheelchair in no  acute distress Head: Normocephalic and atraumatic Eyes:  sclerae anicteric, conjunctiva pink  Ears: Normal auditory acuity Lungs: velcro type sounds in chest and bilateral lower lobes.  Heart: Regular rate and rhythm Abdomen: Soft, obese, non-tender. Sub-optimal exam as patient is wheelchair-bound  Musculoskeletal: Symmetrical with no gross deformities  Extremities: 1+ BLE edema. Neurological: Alert oriented x 4, grossly nonfocal. No asterixis Psychological:  Alert and cooperative. Normal mood and affect  Assessment and Recommendations:  65. 80 year old female with multiple significant medical problems including, but not limited to CKD, diabetes, hypertension, CAD, obesity, rheumatoid arthritis, chronic diastolic heart failure, interstitial lung disease, and cirrhosis. She is on multiple medications including chronic coumadin. Patient is wheelchair-bound. She is in hospice.   2. newly diagnosed cirrhosis, probably NASH. Viral hepatitis B and C serology negative. Patient is s/p LVP during June hospitalization. SAAG compatible with portal hypertension. Ascitic fluid cytology negative for malignancy. Difficult to tell if patient has reaccumulated ascites as she is wheelchair bound.   Given recent history of acute renal failure will keep patient at current dose of diuretics. While she may have some ascites by no means is the abdomen tense and her peripheral edema is minimal.   We discussed 2 g sodium diet    I discussed, and gave patient written information about signs of decompensated liver disease (gastrointestinal bleeding, increasing abdominal girth, jaundice, altered sleeping patterns, mental status changes,  etc). She lives alone, will give this information to home aides and family.   Variceal screening. CTscan in June didn't suggest varices but patient hasn't had a screening EGD. She is certainly at increased risk for variceal bleeding on chronic  Coumadin. Having said that, patient is a  poor endoscopic candidate as she has multiple, significant co- morbidities for which she is currently in hospice  At this point patient seems to be doing fairly well. She basically came for some general information about her prognosis from a liver standpoint. She will call us if questions/problems arise

## 2013-01-01 NOTE — Patient Instructions (Signed)
Family members and caregivers please watch the patient for jaundice (yellowing of the eyes and skin), mental status changes, if blood (bright red or Dark) in the stool, increase in abdominal swelling, or vomiting blood.  Please call office immediately.  These are signs of liver disease. Avoid constipation and follow up with Dr Juanda Chance in 3 months.   CC:  Zollie Beckers MD

## 2013-01-02 ENCOUNTER — Encounter: Payer: Self-pay | Admitting: Nurse Practitioner

## 2013-01-02 NOTE — Progress Notes (Signed)
Reviewed and agree. Monitor AFP, Abdominal sono.

## 2013-01-09 ENCOUNTER — Encounter: Payer: Self-pay | Admitting: *Deleted

## 2013-01-09 ENCOUNTER — Ambulatory Visit (INDEPENDENT_AMBULATORY_CARE_PROVIDER_SITE_OTHER): Payer: Medicare Other | Admitting: Cardiovascular Disease

## 2013-01-09 DIAGNOSIS — I4891 Unspecified atrial fibrillation: Secondary | ICD-10-CM

## 2013-01-09 LAB — POCT INR: INR: 2.5

## 2013-01-30 ENCOUNTER — Ambulatory Visit (INDEPENDENT_AMBULATORY_CARE_PROVIDER_SITE_OTHER): Payer: Medicare Other | Admitting: Pharmacist

## 2013-01-30 DIAGNOSIS — I4891 Unspecified atrial fibrillation: Secondary | ICD-10-CM

## 2013-01-30 LAB — POCT INR: INR: 4.5

## 2013-02-07 ENCOUNTER — Ambulatory Visit (INDEPENDENT_AMBULATORY_CARE_PROVIDER_SITE_OTHER): Payer: Medicare Other | Admitting: Internal Medicine

## 2013-02-07 DIAGNOSIS — I4891 Unspecified atrial fibrillation: Secondary | ICD-10-CM

## 2013-02-07 LAB — POCT INR: INR: 2

## 2013-02-14 ENCOUNTER — Ambulatory Visit (INDEPENDENT_AMBULATORY_CARE_PROVIDER_SITE_OTHER): Payer: Medicare Other | Admitting: Cardiovascular Disease

## 2013-02-14 DIAGNOSIS — I4891 Unspecified atrial fibrillation: Secondary | ICD-10-CM

## 2013-02-14 LAB — POCT INR: INR: 2.1

## 2013-02-22 ENCOUNTER — Ambulatory Visit (INDEPENDENT_AMBULATORY_CARE_PROVIDER_SITE_OTHER): Payer: Medicare Other | Admitting: Pharmacist

## 2013-02-22 DIAGNOSIS — I4891 Unspecified atrial fibrillation: Secondary | ICD-10-CM

## 2013-02-22 LAB — POCT INR: INR: 1.3

## 2013-02-23 ENCOUNTER — Encounter (HOSPITAL_COMMUNITY): Payer: Self-pay | Admitting: Emergency Medicine

## 2013-02-23 ENCOUNTER — Emergency Department (HOSPITAL_COMMUNITY)

## 2013-02-23 ENCOUNTER — Inpatient Hospital Stay (HOSPITAL_COMMUNITY)
Admission: EM | Admit: 2013-02-23 | Discharge: 2013-02-26 | DRG: 689 | Disposition: A | Discharge status: Hospice | Attending: Internal Medicine | Admitting: Internal Medicine

## 2013-02-23 DIAGNOSIS — N390 Urinary tract infection, site not specified: Principal | ICD-10-CM

## 2013-02-23 DIAGNOSIS — N189 Chronic kidney disease, unspecified: Secondary | ICD-10-CM | POA: Diagnosis present

## 2013-02-23 DIAGNOSIS — Z9981 Dependence on supplemental oxygen: Secondary | ICD-10-CM

## 2013-02-23 DIAGNOSIS — R0902 Hypoxemia: Secondary | ICD-10-CM | POA: Diagnosis present

## 2013-02-23 DIAGNOSIS — I4891 Unspecified atrial fibrillation: Secondary | ICD-10-CM | POA: Diagnosis present

## 2013-02-23 DIAGNOSIS — K7460 Unspecified cirrhosis of liver: Secondary | ICD-10-CM | POA: Diagnosis present

## 2013-02-23 DIAGNOSIS — R410 Disorientation, unspecified: Secondary | ICD-10-CM | POA: Diagnosis present

## 2013-02-23 DIAGNOSIS — I2789 Other specified pulmonary heart diseases: Secondary | ICD-10-CM | POA: Diagnosis present

## 2013-02-23 DIAGNOSIS — F05 Delirium due to known physiological condition: Secondary | ICD-10-CM | POA: Diagnosis present

## 2013-02-23 DIAGNOSIS — Z66 Do not resuscitate: Secondary | ICD-10-CM | POA: Diagnosis present

## 2013-02-23 DIAGNOSIS — R188 Other ascites: Secondary | ICD-10-CM | POA: Diagnosis present

## 2013-02-23 DIAGNOSIS — I509 Heart failure, unspecified: Secondary | ICD-10-CM

## 2013-02-23 DIAGNOSIS — D696 Thrombocytopenia, unspecified: Secondary | ICD-10-CM | POA: Diagnosis present

## 2013-02-23 DIAGNOSIS — K7581 Nonalcoholic steatohepatitis (NASH): Secondary | ICD-10-CM | POA: Diagnosis present

## 2013-02-23 DIAGNOSIS — I5033 Acute on chronic diastolic (congestive) heart failure: Secondary | ICD-10-CM | POA: Diagnosis present

## 2013-02-23 DIAGNOSIS — R748 Abnormal levels of other serum enzymes: Secondary | ICD-10-CM | POA: Diagnosis present

## 2013-02-23 DIAGNOSIS — I129 Hypertensive chronic kidney disease with stage 1 through stage 4 chronic kidney disease, or unspecified chronic kidney disease: Secondary | ICD-10-CM | POA: Diagnosis present

## 2013-02-23 DIAGNOSIS — J8410 Pulmonary fibrosis, unspecified: Secondary | ICD-10-CM | POA: Diagnosis present

## 2013-02-23 DIAGNOSIS — Z87891 Personal history of nicotine dependence: Secondary | ICD-10-CM

## 2013-02-23 DIAGNOSIS — Z515 Encounter for palliative care: Secondary | ICD-10-CM

## 2013-02-23 DIAGNOSIS — I482 Chronic atrial fibrillation, unspecified: Secondary | ICD-10-CM | POA: Diagnosis present

## 2013-02-23 DIAGNOSIS — G4733 Obstructive sleep apnea (adult) (pediatric): Secondary | ICD-10-CM | POA: Diagnosis present

## 2013-02-23 DIAGNOSIS — E119 Type 2 diabetes mellitus without complications: Secondary | ICD-10-CM | POA: Diagnosis present

## 2013-02-23 DIAGNOSIS — I2510 Atherosclerotic heart disease of native coronary artery without angina pectoris: Secondary | ICD-10-CM | POA: Diagnosis present

## 2013-02-23 DIAGNOSIS — K7689 Other specified diseases of liver: Secondary | ICD-10-CM | POA: Diagnosis present

## 2013-02-23 DIAGNOSIS — R404 Transient alteration of awareness: Secondary | ICD-10-CM | POA: Diagnosis present

## 2013-02-23 DIAGNOSIS — IMO0001 Reserved for inherently not codable concepts without codable children: Secondary | ICD-10-CM | POA: Diagnosis present

## 2013-02-23 DIAGNOSIS — K219 Gastro-esophageal reflux disease without esophagitis: Secondary | ICD-10-CM | POA: Diagnosis present

## 2013-02-23 LAB — URINALYSIS, ROUTINE W REFLEX MICROSCOPIC
Bilirubin Urine: NEGATIVE
Glucose, UA: NEGATIVE mg/dL
Ketones, ur: NEGATIVE mg/dL
Nitrite: POSITIVE — AB
Protein, ur: 30 mg/dL — AB
Specific Gravity, Urine: 1.018 (ref 1.005–1.030)
Urobilinogen, UA: 1 mg/dL (ref 0.0–1.0)
pH: 5.5 (ref 5.0–8.0)

## 2013-02-23 LAB — APTT: aPTT: 29 seconds (ref 24–37)

## 2013-02-23 LAB — CBC WITH DIFFERENTIAL/PLATELET
Basophils Absolute: 0 10*3/uL (ref 0.0–0.1)
Basophils Relative: 0 % (ref 0–1)
Eosinophils Absolute: 0.2 10*3/uL (ref 0.0–0.7)
Eosinophils Relative: 2 % (ref 0–5)
HCT: 41.2 % (ref 36.0–46.0)
Hemoglobin: 13.6 g/dL (ref 12.0–15.0)
Lymphocytes Relative: 17 % (ref 12–46)
Lymphs Abs: 1.4 K/uL (ref 0.7–4.0)
MCH: 28.9 pg (ref 26.0–34.0)
MCHC: 33 g/dL (ref 30.0–36.0)
MCV: 87.7 fL (ref 78.0–100.0)
Monocytes Absolute: 0.8 K/uL (ref 0.1–1.0)
Monocytes Relative: 9 % (ref 3–12)
Neutro Abs: 6 K/uL (ref 1.7–7.7)
Neutrophils Relative %: 72 % (ref 43–77)
Platelets: 101 K/uL — ABNORMAL LOW (ref 150–400)
RBC: 4.7 MIL/uL (ref 3.87–5.11)
RDW: 18.2 % — ABNORMAL HIGH (ref 11.5–15.5)
WBC: 8.3 K/uL (ref 4.0–10.5)

## 2013-02-23 LAB — BLOOD GAS, ARTERIAL
Acid-base deficit: 5.2 mmol/L — ABNORMAL HIGH (ref 0.0–2.0)
Bicarbonate: 17.6 meq/L — ABNORMAL LOW (ref 20.0–24.0)
FIO2: 0.21 %
O2 Saturation: 88.5 %
Patient temperature: 98.6
TCO2: 15.7 mmol/L (ref 0–100)
pCO2 arterial: 27.6 mmHg — ABNORMAL LOW (ref 35.0–45.0)
pH, Arterial: 7.419 (ref 7.350–7.450)
pO2, Arterial: 58.9 mmHg — ABNORMAL LOW (ref 80.0–100.0)

## 2013-02-23 LAB — PROTIME-INR
INR: 1.34 (ref 0.00–1.49)
Prothrombin Time: 16.3 s — ABNORMAL HIGH (ref 11.6–15.2)

## 2013-02-23 LAB — PRO B NATRIURETIC PEPTIDE: Pro B Natriuretic peptide (BNP): 24178 pg/mL — ABNORMAL HIGH (ref 0–450)

## 2013-02-23 LAB — COMPREHENSIVE METABOLIC PANEL WITH GFR
ALT: 20 U/L (ref 0–35)
AST: 30 U/L (ref 0–37)
CO2: 17 meq/L — ABNORMAL LOW (ref 19–32)
Calcium: 9.3 mg/dL (ref 8.4–10.5)
Chloride: 107 meq/L (ref 96–112)
GFR calc Af Amer: 41 mL/min — ABNORMAL LOW (ref >90)
GFR calc non Af Amer: 35 mL/min — ABNORMAL LOW (ref >90)
Glucose, Bld: 92 mg/dL (ref 70–99)
Sodium: 144 meq/L (ref 137–147)
Total Bilirubin: 1.4 mg/dL — ABNORMAL HIGH (ref 0.3–1.2)

## 2013-02-23 LAB — POCT I-STAT TROPONIN I
Troponin i, poc: 0.16 ng/mL (ref 0.00–0.08)
Troponin i, poc: 0.15 ng/mL (ref 0.00–0.08)

## 2013-02-23 LAB — URINE MICROSCOPIC-ADD ON

## 2013-02-23 LAB — COMPREHENSIVE METABOLIC PANEL
Albumin: 4 g/dL (ref 3.5–5.2)
Alkaline Phosphatase: 113 U/L (ref 39–117)
BUN: 44 mg/dL — ABNORMAL HIGH (ref 6–23)
Creatinine, Ser: 1.38 mg/dL — ABNORMAL HIGH (ref 0.50–1.10)
Potassium: 4.3 mEq/L (ref 3.7–5.3)
Total Protein: 7.6 g/dL (ref 6.0–8.3)

## 2013-02-23 LAB — CG4 I-STAT (LACTIC ACID): Lactic Acid, Venous: 1.5 mmol/L (ref 0.5–2.2)

## 2013-02-23 LAB — TROPONIN I: Troponin I: <0.3 ng/mL (ref <0.30)

## 2013-02-23 LAB — AMMONIA: Ammonia: 20 umol/L (ref 11–60)

## 2013-02-23 MED ORDER — MORPHINE SULFATE 2 MG/ML IJ SOLN: 1.0000 mg | INTRAMUSCULAR | Status: DC | PRN

## 2013-02-23 MED ORDER — ASPIRIN EC 81 MG PO TBEC
81.0000 mg | DELAYED_RELEASE_TABLET | Freq: Every day | ORAL | Status: DC
  Administered 2013-02-23 – 2013-02-25 (×3): 81 mg via ORAL
  Filled 2013-02-23 (×5): qty 1

## 2013-02-23 MED ORDER — ONDANSETRON HCL 4 MG/2ML IJ SOLN: 4.0000 mg | Freq: Four times a day (QID) | INTRAMUSCULAR | Status: DC | PRN

## 2013-02-23 MED ORDER — FUROSEMIDE 10 MG/ML IJ SOLN
40.0000 mg | Freq: Once | INTRAMUSCULAR | Status: AC
  Administered 2013-02-23: 40 mg via INTRAVENOUS
  Filled 2013-02-23: qty 4

## 2013-02-23 MED ORDER — SPIRONOLACTONE 50 MG PO TABS
50.0000 mg | ORAL_TABLET | Freq: Every day | ORAL | Status: DC
  Administered 2013-02-24 – 2013-02-26 (×3): 50 mg via ORAL
  Filled 2013-02-23 (×3): qty 1

## 2013-02-23 MED ORDER — CARVEDILOL 3.125 MG PO TABS
3.1250 mg | ORAL_TABLET | Freq: Two times a day (BID) | ORAL | Status: DC
  Administered 2013-02-23 – 2013-02-26 (×6): 3.125 mg via ORAL
  Filled 2013-02-23 (×9): qty 1

## 2013-02-23 MED ORDER — HYDROCODONE-ACETAMINOPHEN 5-325 MG PO TABS
1.0000 | ORAL_TABLET | ORAL | Status: DC | PRN
  Administered 2013-02-26: 1 via ORAL
  Filled 2013-02-23: qty 2
  Filled 2013-02-23: qty 1

## 2013-02-23 MED ORDER — POLYETHYLENE GLYCOL 3350 17 G PO PACK
17.0000 g | PACK | Freq: Every day | ORAL | Status: DC
  Filled 2013-02-23 (×3): qty 1

## 2013-02-23 MED ORDER — DEXTROSE 5 % IV SOLN
1.0000 g | INTRAVENOUS | Status: DC
  Administered 2013-02-24: 1 g via INTRAVENOUS
  Filled 2013-02-23 (×2): qty 10

## 2013-02-23 MED ORDER — ROPINIROLE HCL 1 MG PO TABS
1.0000 mg | ORAL_TABLET | Freq: Two times a day (BID) | ORAL | Status: DC
  Administered 2013-02-23 – 2013-02-24 (×2): 1 mg via ORAL
  Filled 2013-02-23 (×3): qty 1

## 2013-02-23 MED ORDER — HYDROXYCHLOROQUINE SULFATE 200 MG PO TABS
200.0000 mg | ORAL_TABLET | Freq: Two times a day (BID) | ORAL | Status: DC
  Administered 2013-02-23 – 2013-02-26 (×6): 200 mg via ORAL
  Filled 2013-02-23 (×7): qty 1

## 2013-02-23 MED ORDER — FUROSEMIDE 10 MG/ML IJ SOLN
40.0000 mg | Freq: Every day | INTRAMUSCULAR | Status: DC
  Administered 2013-02-23 – 2013-02-24 (×2): 40 mg via INTRAVENOUS
  Filled 2013-02-23 (×2): qty 4

## 2013-02-23 MED ORDER — ALPRAZOLAM 0.25 MG PO TABS
0.2500 mg | ORAL_TABLET | Freq: Every day | ORAL | Status: DC
  Administered 2013-02-24 – 2013-02-25 (×2): 0.25 mg via ORAL
  Filled 2013-02-23 (×2): qty 1

## 2013-02-23 MED ORDER — SENNOSIDES-DOCUSATE SODIUM 8.6-50 MG PO TABS: 1.0000 | ORAL_TABLET | Freq: Every evening | ORAL | Status: DC | PRN

## 2013-02-23 MED ORDER — SODIUM CHLORIDE 0.9 % IJ SOLN
3.0000 mL | Freq: Two times a day (BID) | INTRAMUSCULAR | Status: DC
  Administered 2013-02-23 – 2013-02-26 (×6): 3 mL via INTRAVENOUS

## 2013-02-23 MED ORDER — ONDANSETRON HCL 4 MG PO TABS: 4.0000 mg | ORAL_TABLET | Freq: Four times a day (QID) | ORAL | Status: DC | PRN

## 2013-02-23 MED ORDER — RIVAROXABAN 15 MG PO TABS
15.0000 mg | ORAL_TABLET | Freq: Every day | ORAL | Status: DC
  Administered 2013-02-23 – 2013-02-25 (×3): 15 mg via ORAL
  Filled 2013-02-23 (×4): qty 1

## 2013-02-23 MED ORDER — ATORVASTATIN CALCIUM 10 MG PO TABS
10.0000 mg | ORAL_TABLET | Freq: Every day | ORAL | Status: DC
  Administered 2013-02-23 – 2013-02-25 (×3): 10 mg via ORAL
  Filled 2013-02-23 (×4): qty 1

## 2013-02-23 MED ORDER — GUAIFENESIN-DM 100-10 MG/5ML PO SYRP: 5.0000 mL | ORAL_SOLUTION | ORAL | Status: DC | PRN

## 2013-02-23 MED ORDER — DEXTROSE 5 % IV SOLN
1.0000 g | Freq: Once | INTRAVENOUS | Status: AC
  Administered 2013-02-23: 1 g via INTRAVENOUS
  Filled 2013-02-23: qty 10

## 2013-02-23 MED ORDER — ALBUTEROL SULFATE (2.5 MG/3ML) 0.083% IN NEBU
2.5000 mg | INHALATION_SOLUTION | Freq: Four times a day (QID) | RESPIRATORY_TRACT | Status: DC
  Administered 2013-02-24 – 2013-02-25 (×5): 2.5 mg via RESPIRATORY_TRACT
  Filled 2013-02-23 (×6): qty 3

## 2013-02-23 MED ORDER — ASPIRIN EC 81 MG PO TBEC
162.0000 mg | DELAYED_RELEASE_TABLET | Freq: Once | ORAL | Status: AC
  Administered 2013-02-23: 162 mg via ORAL
  Filled 2013-02-23 (×2): qty 2
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
## 2013-02-23 NOTE — ED Notes (Signed)
Dr Oletta LamasGhim aware of critical i stat troponin
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
## 2013-02-23 NOTE — H&P (Signed)
[facility]                                                                                    Patient Demographics  Holly [name], is a 80 y.o. female  MRN: [id number]   DOB - 04/25/1931  Admit Date - 02/23/2013  Outpatient Primary MD for the patient is [name], MD  Cardiologist Dr. Mayford Knife   With History of -  Past Medical History  Diagnosis Date  . Myalgia and myositis, unspecified   . Esophageal reflux   . Degeneration of intervertebral disc, site unspecified   . Morbid obesity   . Osteoarthrosis, unspecified whether generalized or localized, unspecified site   . Rheumatoid arthritis(714.0)   . Type II or unspecified type diabetes mellitus without mention of complication, not stated as uncontrolled   . CAD (coronary artery disease)   . Chronic kidney disease   . Thrombocytopenia   . Hypertension   . Hyperlipidemia   . OSA (obstructive sleep apnea)   . Interstitial lung disease   . Atrial fibrillation   . Anemia   . Vitamin D deficiency   . Cirrhosis     NASH  . Diabetes       Past Surgical History  Procedure Laterality Date  . Appendectomy    . Rotator cuff repair    . Replacement total knee    . Tubal ligation    . Ankle fusion    . Coronary angioplasty      in for   Chief Complaint  Patient presents with  . Altered Mental Status     HPI  Holly [name]  is a 80 y.o. female, who is on home Hospice lives alone with a caregiver, H/O [name] with cirrhosis and ascites, severe pulmonary hypertension due to underlying interstitial lung disease uses home oxygen on as needed basis, chronic grade 1 diastolic dysfunction with EF of 55% on echogram last year , atrial fibrillation on Coumadin,?? type 2 diabetes mellitus, hypertension, dyslipidemia, chronic thrombocytopenia, morbid obesity, fibromyalgia who was being followed by hospice at home and lives with a caregiver who apparently for the last few days has been somewhat confused and delirious, last  evening she thought that her house was on fire, she continued to be confused throughout the day today half to which family and the hospice staff brought her to the ER, in the ER her workup was suggestive of UTI, CT of the head is unremarkable, chest x-ray shows mild vascular congestion, EKG has nonspecific changes and her troponin is mildly elevated in a non-ACS pattern kinetic. ABG suggested some hypoxia and pro BNP was elevated.   Patient herself is pleasantly confused in bed oriented x1, answers questions appropriately and follows commands, denies any headache, no fever chills, no chest pain whatsoever, no cough, she has chronic generalized body aches and joint pains attributes that to her arthritis and fibromyalgia, chronic mild abdominal ache for several months to years, no diarrhea no blood in stool or urine, no focal weakness.    Review of Systems    In addition to the HPI above,   No Fever-chills, No Headache, No changes with Vision or hearing, +ve  confusion No problems swallowing food or Liquids, No Chest pain, Cough , Mild Shortness of Breath, No Abdominal pain, No Nausea or Vommitting, Bowel movements are regular, No Blood in stool or Urine, No dysuria, No new skin rashes or bruises, No new joints pains-aches, chronic joint pains and bodyaches No new weakness, tingling, numbness in any extremity, No recent weight gain or loss, No polyuria, polydypsia or polyphagia, No significant Mental Stressors.  A full 10 point Review of Systems was done, except as stated above, all other Review of Systems were negative.   Social History History  Substance Use Topics  . Smoking status: Former Smoker -- 1.00 packs/day for 17 years    Types: Cigarettes    Quit date: 02/08/1977  . Smokeless tobacco: Never Used  . Alcohol Use: No      Family History Family History  Problem Relation Age of Onset  . Rheum arthritis Mother       Prior to Admission medications   Medication Sig  Start Date End Date Taking? Authorizing Provider  ALPRAZolam (XANAX) 0.25 MG tablet Take 1 tablet (0.25 mg total) by mouth at bedtime as needed for anxiety or sleep. 07/17/12  Yes Laveda Normanhris N Oti, MD  aspirin EC 81 MG tablet Take 81 mg by mouth at bedtime.    Yes Historical Provider, MD  furosemide (LASIX) 20 MG tablet Take 1 tablet (20 mg total) by mouth daily. 10/04/12  Yes Henderson CloudEstela Y Hernandez Acosta, MD  HYDROcodone-acetaminophen (LORTAB) 10-500 MG per tablet Take 1 tablet by mouth 2 (two) times daily as needed for pain.    Yes Historical Provider, MD  hydroxychloroquine (PLAQUENIL) 200 MG tablet Take 200 mg by mouth 2 (two) times daily.    Yes Historical Provider, MD  hydrOXYzine (ATARAX/VISTARIL) 10 MG tablet Take 1 tablet (10 mg total) by mouth every 6 (six) hours as needed for itching. 07/17/12  Yes Laveda Normanhris N Oti, MD  levalbuterol Pauline Aus(XOPENEX) 0.63 MG/3ML nebulizer solution Take 3 mLs (0.63 mg total) by nebulization every 4 (four) hours as needed for wheezing or shortness of breath. 07/17/12  Yes Laveda Normanhris N Oti, MD  polyethylene glycol [facility](MIRALAX / GLYCOLAX) packet Take 17 g by mouth daily. 07/17/12  Yes Laveda Normanhris N Oti, MD  rOPINIRole (REQUIP) 0.5 MG tablet Take 1 mg by mouth 2 (two) times daily.    Yes Historical Provider, MD  spironolactone (ALDACTONE) 50 MG tablet Take 1 tablet (50 mg total) by mouth daily. 10/04/12  Yes Henderson CloudEstela Y Hernandez Acosta, MD  warfarin (COUMADIN) 5 MG tablet Take 2.5-5 mg by mouth every evening. 0.5 tab daily except 1 tab on Mondays and Wednesdays. 05/08/11  Yes Historical Provider, MD    Allergies  Allergen Reactions  . Amoxicillin-Pot Clavulanate     Diarrhea ,fatigue mouth sores   . Doxycycline Hyclate   . Iodinated Diagnostic Agents     Full body rash   . Nitrofuran Derivatives   . Norvasc [Amlodipine Besylate]     Leg swelling  . Plavix [Clopidogrel Bisulfate]     REACTION: SOB, and rash  . Sulfa Antibiotics     And adhesive tape  . Phenergan [Promethazine Hcl] Anxiety     Physical Exam  Vitals  Blood pressure 124/66, pulse 73, temperature 97.7 F (36.5 C), temperature source Oral, resp. rate 22, height 5\' 4"  (1.626 m), weight 82.101 kg (181 lb), SpO2 100.00%.   1. General elderly obese white female lying in bed in NAD,     2. Normal affect and insight,  Not Suicidal or Homicidal, Awake, pleasantly confused, Oriented X 1.  3. No F.N deficits, ALL C.Nerves Intact, Strength 5/5 all 4 extremities, Sensation intact all 4 extremities, Plantars down going.  4. Ears and Eyes appear Normal, Conjunctivae clear, PERRLA. Moist Oral Mucosa.  5. Supple Neck, No JVD, No cervical lymphadenopathy appriciated, No Carotid Bruits.  6. Symmetrical Chest wall movement, Good air movement bilaterally, bibasilar rales  7. RRR, No Gallops, Rubs or Murmurs, No Parasternal Heave.  8. Positive Bowel Sounds, Abdomen Soft has mild ascites, Non tender, No organomegaly appriciated,No rebound -guarding or rigidity.  9.  No Cyanosis, Normal Skin Turgor, No Skin Rash or Bruise. R>L chronic leg edema  10. Good muscle tone,  joints appear normal , no effusions, Normal ROM.  11. No Palpable Lymph Nodes in Neck or Axillae     Data Review  CBC  Recent Labs Lab 02/23/13 1600  WBC 8.3  HGB 13.6  HCT 41.2  PLT 101*  MCV 87.7  MCH 28.9  MCHC 33.0  RDW 18.2*  LYMPHSABS 1.4  MONOABS 0.8  EOSABS 0.2  BASOSABS 0.0   ------------------------------------------------------------------------------------------------------------------  Chemistries   Recent Labs Lab 02/23/13 1600  NA 144  K 4.3  CL 107  CO2 17*  GLUCOSE 92  BUN 44*  CREATININE 1.38*  CALCIUM 9.3  AST 30  ALT 20  ALKPHOS 113  BILITOT 1.4*   ------------------------------------------------------------------------------------------------------------------ estimated creatinine clearance is 34.3 ml/min (by C-G formula based on Cr of  1.38). ------------------------------------------------------------------------------------------------------------------ No results found for this basename: TSH, T4TOTAL, FREET3, T3FREE, THYROIDAB,  in the last 72 hours   Coagulation profile  Recent Labs Lab 02/22/13  INR 1.3   ------------------------------------------------------------------------------------------------------------------- No results found for this basename: DDIMER,  in the last 72 hours -------------------------------------------------------------------------------------------------------------------  Cardiac Enzymes No results found for this basename: CK, CKMB, TROPONINI, MYOGLOBIN,  in the last 168 hours ------------------------------------------------------------------------------------------------------------------ No components found with this basename: POCBNP,    ---------------------------------------------------------------------------------------------------------------  Urinalysis    Component Value Date/Time   COLORURINE YELLOW 02/23/2013 1746   APPEARANCEUR CLOUDY* 02/23/2013 1746   LABSPEC 1.018 02/23/2013 1746   PHURINE 5.5 02/23/2013 1746   GLUCOSEU NEGATIVE 02/23/2013 1746   HGBUR TRACE* 02/23/2013 1746   BILIRUBINUR NEGATIVE 02/23/2013 1746   KETONESUR NEGATIVE 02/23/2013 1746   PROTEINUR 30* 02/23/2013 1746   UROBILINOGEN 1.0 02/23/2013 1746   NITRITE POSITIVE* 02/23/2013 1746   LEUKOCYTESUR SMALL* 02/23/2013 1746    ----------------------------------------------------------------------------------------------------------------  Imaging results:   Dg Chest 2 View  02/23/2013   CLINICAL DATA:  Cough, shortness of breath, confusion, history of interstitial lung disease, hypertension, diabetes  EXAM: CHEST  2 VIEW  COMPARISON:  07/12/2012; 07/09/2012; 12/20/2011; 10/02/2008; chest CT -10/16/2008  FINDINGS: The examination is degraded due to patient body habitus.  Grossly unchanged enlarged cardiac  silhouette and mediastinal contours. Evaluation of the retrosternal clear space is obscured secondary to overlying soft tissues. Atherosclerotic plaque within the descending thoracic aorta. Post coronary stent placement. Peripherally calcified circular structure within the right lung apex is favored to represent atherosclerotic plaque within the right brachiocephalic artery is demonstrated on remote chest CT. Pulmonary venous congestion without definite evidence of edema. Grossly unchanged perihilar and bilateral medial basilar opacities. No new focal airspace opacities. No definite pleural effusion or pneumothorax. Grossly unchanged bones including suspected bilateral rotator cuff tears.  IMPRESSION: Cardiomegaly and pulmonary venous congestion without definite acute cardiopulmonary disease. Specifically, no definite evidence of edema.   Electronically Signed   By: Holland Commons.D.  On: 02/23/2013 16:47   Ct Head Wo Contrast  02/23/2013   CLINICAL DATA:  Altered level of consciousness, hallucinations  EXAM: CT HEAD WITHOUT CONTRAST  TECHNIQUE: Contiguous axial images were obtained from the base of the skull through the vertex without intravenous contrast.  COMPARISON:  11/11/2011  FINDINGS: No evidence of parenchymal hemorrhage or extra-axial fluid collection. No mass lesion, mass effect, or midline shift.  No CT evidence of acute infarction.  Encephalomalacic changes in the right parieto-occipital region, unchanged.  Subcortical white matter and periventricular small vessel ischemic changes. Intracranial atherosclerosis.  Cerebral volume is within normal limits.  No ventriculomegaly.  The visualized paranasal sinuses are essentially clear. The mastoid air cells are unopacified.  No evidence of calvarial fracture.  IMPRESSION: No evidence of acute intracranial abnormality.  Small vessel ischemic changes with intracranial atherosclerosis.   Electronically Signed   By: Charline Bills M.D.   On: 02/23/2013  18:03    My personal review of EKG: Rhythm Afib- 75/min, Non specific ST changes    Assessment & Plan  1. Delirium secondary to UTI. She will be admitted to telemetry bed, urine cultures, place on Rocephin for now, reduce benzodiazepine use, monitor clinically, fall and aspiration precautions. Check ammonia level, also some of the delirium could be accounted by her hypoxia.     2. Atrial fibrillation. Goal will be rate control, since she is hospice status we'll switch her from Coumadin to xaralto as she will require less to no blood draws.     3. Acute on chronic grade 1 diastolic dysfunction in the setting of severe pulmonary hypertension (accounting for severe pro BNP rise ) due to interstitial lung disease - we'll switch her to IV Lasix, continue home dose Aldactone, place on low-dose Coreg, salt and fluid restriction and monitor. Provide her with oxygen and nebulizer treatments as needed. She is on home oxygen which he uses intermittently.     4. Mildly elevated troponin. The pattern is non-ACS, even last admission her troponin had bumped to these levels, she has no chest pain and no acute EKG changes, she is hospice status, is not a candidate for heroic procedures left heart cath. We'll place her on appropriate medical treatment which will include diuretic for a compensated heart failure, oxygen, beta blocker, low dose statin and aspirin. Cycle troponin to determine prognosis.     5. Obstructive sleep apnea. Uses oxygen on as needed basis which we'll continue.     6. Question of type 2 diabetes mellitus at home. On no medications, due to her hospice status and present glucose level of 92 I believe are without further sliding scale on Accu-Cheks to minimize discomfort.     7. Fibromyalgia and restless sick syndrome. Home medications will be continued.     8.NASH with cirrhosis. Diuretics as in #3 above, will check ammonia level in the light of her delirium. Probably  not a good candidate for long-term statin.     DVT Prophylaxis Xaralto  AM Labs Ordered, also please review Full Orders  Family Communication: Admission, patients condition and plan of care including tests being ordered have been discussed with the patient and who daughter in law indicate understanding and agree with the plan and Code Status.  Code Status DNR  Likely DC to  TBD ( ? Inpatient Hospice)  Condition GUARDED   Time spent in minutes : 35    Susa Raring K M.D on 02/23/2013 at 9:17 PM  Between 7am to 7pm - Pager - [phone]  After 7pm go to [email or web address] - password [email or web address]  And look for the night coverage person covering me after hours  [facility] Office  [phone]

## 2013-02-23 NOTE — Progress Notes (Signed)
   CARE MANAGEMENT ED NOTE 02/23/2013  Patient:  Holly HickmanGREER,Holly [name]   Account Number:  [id number]  Date Initiated:  02/23/2013  Documentation initiated by:  Edd ArbourGIBBS,Danasha Melman  Subjective/Objective Assessment:   80 yr old blue medicare [place] resident with daughter in law at bedside confirms she is presently [name] hospice pt and will be released from hospice "in [name] week" CM consult for home health services     Subjective/Objective Assessment Detail:   [facility]  PCP is Dr. Zachery DauerBarnes.     Action/Plan:   Cm spoke with pt, daughter in law, Cm discussed home health, PDNs and the  differences in the two CM discussed meals on wheels services  Cm spoke with Eunice BlaseDebbie of [facility] about hospice services ending in [name] week Referral accepted for pt   Action/Plan Detail:   Cm updated Dr Oletta LamasGhim Pending face to face orders from EDP   Anticipated DC Date:  02/23/2013     Status Recommendation to Physician:   Result of Recommendation:    Other ED Services  Consult Working Plan    DC Planning Services  Other   [facility]  HOME [facility]ALTH   Choice offered to / List presented to:  C-1 Patient     HH arranged  HH-2 PT  HH-1 RN  HH-3 OT  HH-4 NURSE'S AIDE  HH-6 SOCIAL WORKER      [facility]    Status of service:  Completed, signed off  ED Comments:   ED Comments Detail:  CM reviewed in details medicare guidelines, home health (HH) (length of stay in home, [facility], coverage, primary caregiver, up to 24 hrs before services may be started), Private duty nursing (PDN-coverage, length of stay in the home types of staff available), assisted living (ASL- coverage, services offered) and Skilled nursing facilities (snf- coverage and services offered)  CM reviewed availability of HH SW to assist pcp to get pt to snf (if desired disposition) from the community level. CM provided pt/daighter in law with [name] list of [place] home  health agencies, and PDN.  1639 Cm called in pt meals on wheels referral to Richard at [address], [place] [address]  Phone: [phone] location Pt had been evaluated previously and found not be [name] candidate because she had cna coverage around the lunch time hours 1030-1330 Cm informed Richard the pt reports having [name] PDN CNA from 8-11 am & 6-8 pm Gerlene BurdockRichard stated he would put [name] note on pt chart and have the SW review it and contact the pt at [phone]

## 2013-02-23 NOTE — Progress Notes (Signed)
ANTICOAGULATION CONSULT NOTE - Initial Consult  Pharmacy Consult for Xarelto Indication: atrial fibrillation  Allergies  Allergen Reactions  . Amoxicillin-Pot Clavulanate     Diarrhea ,fatigue mouth sores   . Doxycycline Hyclate   . Iodinated Diagnostic Agents     Full body rash   . Nitrofuran Derivatives   . Norvasc [Amlodipine Besylate]     Leg swelling  . Plavix [Clopidogrel Bisulfate]     REACTION: SOB, and rash  . Sulfa Antibiotics     And adhesive tape  . Phenergan [Promethazine Hcl] Anxiety    Patient Measurements: Height: 5\' 4"  (162.6 cm) Weight: 181 lb (82.101 kg) IBW/kg (Calculated) : 54.7  Vital Signs: Temp: 97.7 F (36.5 C) (01/16 2049) Temp src: Oral (01/16 2049) BP: 124/66 mmHg (01/16 1952) Pulse Rate: 73 (01/16 1952)  Labs:  Recent Labs  02/22/13 02/23/13 1600  HGB  --  13.6  HCT  --  41.2  PLT  --  101*  INR 1.3  --   CREATININE  --  1.38*    Estimated Creatinine Clearance: 34.3 ml/min (by C-G formula based on Cr of 1.38).   Medical History: Past Medical History  Diagnosis Date  . Myalgia and myositis, unspecified   . Esophageal reflux   . Degeneration of intervertebral disc, site unspecified   . Morbid obesity   . Osteoarthrosis, unspecified whether generalized or localized, unspecified site   . Rheumatoid arthritis(714.0)   . Type II or unspecified type diabetes mellitus without mention of complication, not stated as uncontrolled   . CAD (coronary artery disease)   . Chronic kidney disease   . Thrombocytopenia   . Hypertension   . Hyperlipidemia   . OSA (obstructive sleep apnea)   . Interstitial lung disease   . Atrial fibrillation   . Anemia   . Vitamin D deficiency   . Cirrhosis     NASH  . Diabetes      Assessment: [age] yoF on warfarin PTA for atrial fibrillation admitted with delirium secondary to UTI.  Since patient is hospice status, MD ordered to switch patient to Xarelto to avoid frequent blood draws for warfarin  therapy.  INR 1.3 yesterday (on warfarin 2.5 mg daily) and warfarin clinic advised patient to take 5 mg on 1/15 and 1/16.  INR in process now.  Will start Xarelto if INR<2.0.  SCr 1.38, CrCl~42 ml/min  INR today 1.34   Goal of Therapy:  prevention of stroke secondary to atrial fibrillation   Plan:  Xarelto 15 mg once daily with meal for CrCl 15-50 mL/min.  First dose tonight since INR subtherapeutic.  Clance Bollunyon, Deaundra Kutzer 02/23/2013,9:45 PM
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
## 2013-02-23 NOTE — Progress Notes (Signed)
[facility] ED Rm15  [facility] Noss[facility]el [name] Visit M. Konrad DoloresLester, RN  Courtesy visit, [facility]; seen at bedside pt lying on stretcher daughter in room pt had just returned from CXR; d-i-l at bedside; pt and d-i-l voiced concern about possibility of hospice services ending and that they were discussing options with [facility]attenKim if needed for future; d-i-l and pt relayed information of episode earlier in the week when pt had become mostly unresponsive but when woke up she declined transport to hospital for evaluation; d-il shared concern that things are changing, support offered; on this visit both pt and d-i-l voiced wanting to know what is going on- work-up is currently in progress and disposition undetermined  Pt has GOLD OOF DNR form with her paperwork and home medication list is on shadow chart;Pt/d-i-l aware [facility] will follow-up  Please call [facility] @ [phone]- ask for on-call RN with any hospice needs.  Thank you ,Valente DavidMargie Renly Roots, [facility] Hospital Liaison

## 2013-02-24 LAB — CBC
HCT: 38.8 % (ref 36.0–46.0)
Hemoglobin: 12.6 g/dL (ref 12.0–15.0)
MCH: 28.5 pg (ref 26.0–34.0)
MCHC: 32.5 g/dL (ref 30.0–36.0)
MCV: 87.8 fL (ref 78.0–100.0)
PLATELETS: 106 10*3/uL — AB (ref 150–400)
RBC: 4.42 MIL/uL (ref 3.87–5.11)
RDW: 18.3 % — ABNORMAL HIGH (ref 11.5–15.5)
WBC: 7.1 10*3/uL (ref 4.0–10.5)

## 2013-02-24 LAB — BASIC METABOLIC PANEL
BUN: 45 mg/dL — ABNORMAL HIGH (ref 6–23)
CALCIUM: 9 mg/dL (ref 8.4–10.5)
CO2: 20 mEq/L (ref 19–32)
CREATININE: 1.53 mg/dL — AB (ref 0.50–1.10)
Chloride: 103 mEq/L (ref 96–112)
GFR calc non Af Amer: 31 mL/min — ABNORMAL LOW (ref >90)
GFR, EST AFRICAN AMERICAN: 36 mL/min — AB (ref >90)
Glucose, Bld: 126 mg/dL — ABNORMAL HIGH (ref 70–99)
Potassium: 4.3 mEq/L (ref 3.7–5.3)
Sodium: 140 mEq/L (ref 137–147)

## 2013-02-24 LAB — TROPONIN I

## 2013-02-24 MED ORDER — ROPINIROLE HCL 1 MG PO TABS
2.0000 mg | ORAL_TABLET | Freq: Two times a day (BID) | ORAL | Status: DC
  Administered 2013-02-24 – 2013-02-26 (×4): 2 mg via ORAL
  Filled 2013-02-24 (×5): qty 2

## 2013-02-24 MED ORDER — FUROSEMIDE 40 MG PO TABS
40.0000 mg | ORAL_TABLET | Freq: Every day | ORAL | Status: DC
  Administered 2013-02-25 – 2013-02-26 (×2): 40 mg via ORAL
  Filled 2013-02-24 (×2): qty 1

## 2013-02-24 NOTE — Progress Notes (Signed)
PROGRESS NOTE  Holly [name] ZOX:[id number] DOB: 1931/03/13 DOA: 02/23/2013 PCP: [name], MD  Assessment/Plan:  Delirium secondary to UTI. - she seems improved this morning, she is alert to place, person, time. Does not know the day of the week but that is normal per her son. Ammonia level normal.  - urine cultures pending, continue Ceftriaxone.  Atrial fibrillation. Goal will be rate control, since she is hospice status we'll switch her from Coumadin to xaralto as she will require less to no blood draws.  Acute on chronic grade 1 diastolic dysfunction in the setting of severe pulmonary hypertension (accounting for severe pro BNP rise) due to interstitial lung disease - on IV Lasix 40 mg daily, mild bump in Cr, will transition to oral tomorrow.  - continue 50 Aldactone, Coreg. Mildly elevated troponin.  - POC troponin elevated, subsequent enzymes negative.  Obstructive sleep apnea. Uses oxygen on as needed basis which we'll continue.  Type 2 diabetes mellitus  - A1C 6.0 last year; daily fasting CBGs Fibromyalgia and restless sick syndrome. Home medications will be continued.  NASH with cirrhosis. Diuretics as in #3 above. Not a good candidate for long-term statin.  Diet: heart Fluids: none DVT Prophylaxis: Xarelto  Code Status: DNR Family Communication: son  Disposition Plan: inpatient, home when ready. PT to evaluate, patient adamantly does not want SNF.   Consultants:  none  Procedures:  none   Antibiotics  Anti-infectives   Start     Dose/Rate Route Frequency Ordered Stop   02/23/13 2245  hydroxychloroquine (PLAQUENIL) tablet 200 mg     200 mg Oral 2 times daily 02/23/13 2232     02/23/13 2115  cefTRIAXone (ROCEPHIN) 1 g in dextrose 5 % 50 mL IVPB     1 g 100 mL/hr over 30 Minutes Intravenous Every 24 hours 02/23/13 2111     02/23/13 2030  cefTRIAXone (ROCEPHIN) 1 g in dextrose 5 % 50 mL IVPB     1 g 100 mL/hr over 30 Minutes Intravenous  Once  02/23/13 2027 02/23/13 2122     Antibiotics Given (last 72 hours)   Date/Time Action Medication Dose   02/23/13 2307 Given   hydroxychloroquine (PLAQUENIL) tablet 200 mg 200 mg   02/24/13 8119 Given   hydroxychloroquine (PLAQUENIL) tablet 200 mg 200 mg      HPI/Subjective: - feeling better this morning.   Objective: Filed Vitals:   02/23/13 2229 02/24/13 0627 02/24/13 0649 02/24/13 0740  BP: 131/74 107/57    Pulse: 81 70    Temp: 97.4 F (36.3 C) 97.5 F (36.4 C)    TempSrc: Oral Oral    Resp: 22 20    Height:      Weight:   83.6 kg (184 lb 4.9 oz)   SpO2: 100% 99%  97%    Intake/Output Summary (Last 24 hours) at 02/24/13 1115 Last data filed at 02/24/13 0450  Gross per 24 hour  Intake    480 ml  Output      0 ml  Net    480 ml   Filed Weights   02/23/13 1334 02/24/13 0649  Weight: 82.101 kg (181 lb) 83.6 kg (184 lb 4.9 oz)    Exam:  General:  NAD  Cardiovascular: regular rate and rhythm, without MRG  Respiratory: good air movement, no wheezing  Abdomen: soft, not tender to palpation  MSK: no peripheral edema  Neuro: non focal  Data Reviewed: Basic Metabolic Panel:  Recent Labs Lab 02/23/13 1600 02/24/13  0320  NA 144 140  K 4.3 4.3  CL 107 103  CO2 17* 20  GLUCOSE 92 126*  BUN 44* 45*  CREATININE 1.38* 1.53*  CALCIUM 9.3 9.0   Liver Function Tests:  Recent Labs Lab 02/23/13 1600  AST 30  ALT 20  ALKPHOS 113  BILITOT 1.4*  PROT 7.6  ALBUMIN 4.0    Recent Labs Lab 02/23/13 2102  AMMONIA 20   CBC:  Recent Labs Lab 02/23/13 1600 02/24/13 0320  WBC 8.3 7.1  NEUTROABS 6.0  --   HGB 13.6 12.6  HCT 41.2 38.8  MCV 87.7 87.8  PLT 101* 106*   Cardiac Enzymes:  Recent Labs Lab 02/23/13 2143 02/24/13 0320 02/24/13 0909  TROPONINI <0.30 <0.30 <0.30   BNP (last 3 results)  Recent Labs  07/10/12 1025 10/02/12 2144 02/23/13 1600  PROBNP 28016.0* 3050.0* 24178.0*   Studies: Dg Chest 2 View  02/23/2013   CLINICAL  DATA:  Cough, shortness of breath, confusion, history of interstitial lung disease, hypertension, diabetes  EXAM: CHEST  2 VIEW  COMPARISON:  07/12/2012; 07/09/2012; 12/20/2011; 10/02/2008; chest CT -10/16/2008  FINDINGS: The examination is degraded due to patient body habitus.  Grossly unchanged enlarged cardiac silhouette and mediastinal contours. Evaluation of the retrosternal clear space is obscured secondary to overlying soft tissues. Atherosclerotic plaque within the descending thoracic aorta. Post coronary stent placement. Peripherally calcified circular structure within the right lung apex is favored to represent atherosclerotic plaque within the right brachiocephalic artery is demonstrated on remote chest CT. Pulmonary venous congestion without definite evidence of edema. Grossly unchanged perihilar and bilateral medial basilar opacities. No new focal airspace opacities. No definite pleural effusion or pneumothorax. Grossly unchanged bones including suspected bilateral rotator cuff tears.  IMPRESSION: Cardiomegaly and pulmonary venous congestion without definite acute cardiopulmonary disease. Specifically, no definite evidence of edema.   Electronically Signed   By: Simonne Come M.D.   On: 02/23/2013 16:47   Ct Head Wo Contrast  02/23/2013   CLINICAL DATA:  Altered level of consciousness, hallucinations  EXAM: CT HEAD WITHOUT CONTRAST  TECHNIQUE: Contiguous axial images were obtained from the base of the skull through the vertex without intravenous contrast.  COMPARISON:  11/11/2011  FINDINGS: No evidence of parenchymal hemorrhage or extra-axial fluid collection. No mass lesion, mass effect, or midline shift.  No CT evidence of acute infarction.  Encephalomalacic changes in the right parieto-occipital region, unchanged.  Subcortical white matter and periventricular small vessel ischemic changes. Intracranial atherosclerosis.  Cerebral volume is within normal limits.  No ventriculomegaly.  The visualized  paranasal sinuses are essentially clear. The mastoid air cells are unopacified.  No evidence of calvarial fracture.  IMPRESSION: No evidence of acute intracranial abnormality.  Small vessel ischemic changes with intracranial atherosclerosis.   Electronically Signed   By: Charline Bills M.D.   On: 02/23/2013 18:03   Scheduled Meds: . albuterol  2.5 mg Nebulization QID  . ALPRAZolam  0.25 mg Oral QHS  . aspirin EC  81 mg Oral QHS  . atorvastatin  10 mg Oral q1800  . carvedilol  3.125 mg Oral BID WC  . cefTRIAXone (ROCEPHIN)  IV  1 g Intravenous Q24H  . furosemide  40 mg Intravenous Daily  . hydroxychloroquine  200 mg Oral BID  . polyethylene glycol  17 g Oral Daily  . rivaroxaban  15 mg Oral Q supper  . rOPINIRole  2 mg Oral BID  . sodium chloride  3 mL Intravenous Q12H  . spironolactone  50 mg Oral Daily   Continuous Infusions:   Active Problems:   DIABETES MELLITUS, TYPE II   Morbid obesity   CORONARY ARTERY DISEASE   PULMONARY HYPERTENSION   GERD   FIBROMYALGIA   OSA (obstructive sleep apnea)   Atrial fibrillation   Delirium   NASH (nonalcoholic steatohepatitis)  Time spent: 35  Pamella Pertostin Gherghe, MD [facility] Pager [phone]. If 7 PM - 7 AM, please contact night-coverage at [email or web address], password [email or web address] 02/24/2013, 11:15 AM  LOS: 1 day

## 2013-02-24 NOTE — Progress Notes (Signed)
[facility] [address] NossJanel Senner [facility] RN Visit Ulis RiasErin Osborne , RN  Upon arrival patient was sloughed at the bottom of the bed. Patient is alert and asking to go home. Patient denies any pain except pain to go home. Spoke with nurse Toniann FailWendy and she stated she would not be going home today. Secretary and nurse notified of need of help with pulling up in bed.   Please call [facility] @ [phone]- ask for on-call RN with any hospice needs.  Thank you Ulis RiasErin Osborne , RN

## 2013-02-24 NOTE — Progress Notes (Signed)
Pt. O2sat level went down to 85-88% on 2LPM. Change the oxygen to 4LPM. No signs of decrease in o2sat level after. Continue to monitor.

## 2013-02-24 NOTE — Progress Notes (Signed)
RT placed patient on her home cpap machine per home settings with full face mask and 4lpm 02 bleed in. Patient is tolerating cpap well at this time.

## 2013-02-25 LAB — CBC
HEMATOCRIT: 39 % (ref 36.0–46.0)
HEMOGLOBIN: 12.6 g/dL (ref 12.0–15.0)
MCH: 28.5 pg (ref 26.0–34.0)
MCHC: 32.3 g/dL (ref 30.0–36.0)
MCV: 88.2 fL (ref 78.0–100.0)
Platelets: 104 10*3/uL — ABNORMAL LOW (ref 150–400)
RBC: 4.42 MIL/uL (ref 3.87–5.11)
RDW: 18 % — AB (ref 11.5–15.5)
WBC: 6.6 10*3/uL (ref 4.0–10.5)

## 2013-02-25 LAB — BASIC METABOLIC PANEL
BUN: 52 mg/dL — AB (ref 6–23)
CALCIUM: 9 mg/dL (ref 8.4–10.5)
CO2: 19 mEq/L (ref 19–32)
CREATININE: 1.58 mg/dL — AB (ref 0.50–1.10)
Chloride: 101 mEq/L (ref 96–112)
GFR, EST AFRICAN AMERICAN: 35 mL/min — AB (ref >90)
GFR, EST NON AFRICAN AMERICAN: 30 mL/min — AB (ref >90)
GLUCOSE: 89 mg/dL (ref 70–99)
Potassium: 4.5 mEq/L (ref 3.7–5.3)
Sodium: 137 mEq/L (ref 137–147)

## 2013-02-25 LAB — URINE CULTURE: Colony Count: >=100000

## 2013-02-25 LAB — GLUCOSE, CAPILLARY: Glucose-Capillary: 79 mg/dL (ref 70–99)

## 2013-02-25 MED ORDER — CEFUROXIME AXETIL 250 MG PO TABS
250.0000 mg | ORAL_TABLET | Freq: Two times a day (BID) | ORAL | Status: DC
  Administered 2013-02-25 – 2013-02-26 (×2): 250 mg via ORAL
  Filled 2013-02-25 (×4): qty 1

## 2013-02-25 MED ORDER — ALBUTEROL SULFATE (2.5 MG/3ML) 0.083% IN NEBU: 2.5000 mg | INHALATION_SOLUTION | Freq: Four times a day (QID) | RESPIRATORY_TRACT | Status: DC | PRN

## 2013-02-25 MED ORDER — ALBUTEROL SULFATE (2.5 MG/3ML) 0.083% IN NEBU
2.5000 mg | INHALATION_SOLUTION | Freq: Four times a day (QID) | RESPIRATORY_TRACT | Status: DC
  Administered 2013-02-25: 2.5 mg via RESPIRATORY_TRACT
  Filled 2013-02-25: qty 3

## 2013-02-25 MED ORDER — ENSURE COMPLETE PO LIQD
237.0000 mL | Freq: Two times a day (BID) | ORAL | Status: DC
  Administered 2013-02-25 – 2013-02-26 (×2): 237 mL via ORAL

## 2013-02-25 NOTE — Evaluation (Addendum)
Physical Therapy Evaluation-1x Patient Details Name: Holly HickmanJanel A [name] MRN: [id number] DOB: 05-08-1931 Today's Date: 02/25/2013 Time: [id number] PT Time Calculation (min): 12 min  PT Assessment / Plan / Recommendation History of Present Illness  80 yo female admitted with delirium. Pt lives alone but has aides 8a-12, and 6-9 pm.   Clinical Impression  Bed level eval only-pt does not perform standing, transfers, or ambulation. At baseline, aide use hoyer lift for OOB to chair and assists with hygiene tasks/ADLs. Pt declined OOB at time of evaluation. Pt also declines placement. No further acute PT needs. Recommend OOB to chair with nursing using hoyer lift. Will sign off.     PT Assessment  Patent does not need any further PT services    Follow Up Recommendations  No PT follow up (pt refuses placement. will need to ensure she has aides continuing to come to home to assist as before)    Does the patient have the potential to tolerate intense rehabilitation      Barriers to Discharge        Equipment Recommendations  None recommended by PT    Recommendations for Other Services     Frequency      Precautions / Restrictions Precautions Precautions: Fall Restrictions Weight Bearing Restrictions: No   Pertinent Vitals/Pain Generalized pain from "fibromyalgia per pt"-unrated      Mobility  Bed Mobility Overal bed mobility: Needs Assistance Bed Mobility: Rolling Rolling: Min assist General bed mobility comments: Assist to roll to L and R for bedpan placement. Pt requires use of bedrails.  Transfers General transfer comment: Pt does not transfer. Hoyer lift is used for bed<wheelchair Ambulation/Gait General Gait Details: Non -ambulatory    Exercises     PT Diagnosis:    PT Problem List:   PT Treatment Interventions:       PT Goals(Current goals can be found in the care plan section) Acute Rehab PT Goals Patient Stated Goal: home tomorrow PT Goal Formulation: No goals  set, d/c therapy  Visit Information  Last PT Received On: 02/25/13 Assistance Needed: +2 History of Present Illness: 80 yo female admitted with delirium. Pt lives alone but has aides 8a-12, and 6-9 pm.        Prior Functioning  Home Living Family/patient expects to be discharged to:: Private residence Living Arrangements: Alone Available Help at Discharge: Personal care attendant (8a-12, 6p-9p) Type of Home: House Home Equipment: Other (comment);Hospital bed;Wheelchair - manual (hoyer lift) Prior Function Level of Independence: Needs assistance Comments: pt wears depends. caregivers change/perform hygience when they come in Communication Communication: No difficulties    Cognition  Cognition Arousal/Alertness: Awake/alert Behavior During Therapy: WFL for tasks assessed/performed Overall Cognitive Status: Within Functional Limits for tasks assessed    Extremity/Trunk Assessment Upper Extremity Assessment Upper Extremity Assessment: Generalized weakness Lower Extremity Assessment Lower Extremity Assessment: Generalized weakness   Balance    End of Session PT - End of Session Activity Tolerance: Patient tolerated treatment well Patient left: with call bell/phone within reach Nurse Communication: Other (comment) (pt on bedpan-will need assistance for hygiene)  GP     Rebeca AlertJannie Tyrah [name], MPT Pager: [phone]

## 2013-02-25 NOTE — Progress Notes (Signed)
INITIAL NUTRITION ASSESSMENT  DOCUMENTATION CODES Per approved criteria  -Obesity Unspecified   INTERVENTION: Ensure Complete po BID, each supplement provides 350 kcal and 13 grams of protein  NUTRITION DIAGNOSIS: Inadequate oral intake related to AMS as evidenced by wt loss.   Goal: Pt to meet >/= 90% of their estimated nutrition needs   Monitor:  Wt, po intake, labs  Reason for Assessment: MST  80 y.o. female  Admitting Dx: <principal problem not specified>  ASSESSMENT: 80 y.o. female, who is on home Hospice lives alone with a caregiver, H/O [name] with cirrhosis and ascites, severe pulmonary hypertension due to underlying interstitial lung disease uses home oxygen on as needed basis, chronic grade 1 diastolic dysfunction with EF of 55% on echogram last year , atrial fibrillation on Coumadin,?? type 2 diabetes mellitus, hypertension, dyslipidemia, chronic thrombocytopenia, morbid obesity, fibromyalgia who was being followed by hospice at home and lives with a caregiver who apparently for the last few days has been somewhat confused and delirious.  Pt reports that she has been eating "ok." She says that she was drinking ensure at home, but has since stopped. Pt appeared to be very physically uncomfortable during RD visit. She reports her usual body weight as ~180 lbs.   Height: Ht Readings from Last 1 Encounters:  02/23/13 5\' 4"  (1.626 m)    Weight: Wt Readings from Last 1 Encounters:  02/25/13 175 lb 4.3 oz (79.5 kg)    Ideal Body Weight: 54.7 kg  % Ideal Body Weight: 145%  Wt Readings from Last 10 Encounters:  02/25/13 175 lb 4.3 oz (79.5 kg)  10/04/12 181 lb 14.1 oz (82.5 kg)  07/17/12 242 lb (109.77 kg)  12/18/10 240 lb (108.863 kg)  11/20/08 253 lb (114.76 kg)    Usual Body Weight: 180 lbs  % Usual Body Weight: 97%  BMI:  Body mass index is 30.07 kg/(m^2).  Estimated Nutritional Needs: Kcal: 2000-2300 Protein: 95-105 g Fluid: 2.0-2.3 L  Skin:  WNL  Diet Order: Cardiac  EDUCATION NEEDS: -No education needs identified at this time   Intake/Output Summary (Last 24 hours) at 02/25/13 1104 Last data filed at 02/25/13 0900  Gross per 24 hour  Intake    650 ml  Output      0 ml  Net    650 ml    Last BM: 1/17 on    Labs:   Recent Labs Lab 02/23/13 1600 02/24/13 0320 02/25/13 0525  NA 144 140 137  K 4.3 4.3 4.5  CL 107 103 101  CO2 17* 20 19  BUN 44* 45* 52*  CREATININE 1.38* 1.53* 1.58*  CALCIUM 9.3 9.0 9.0  GLUCOSE 92 126* 89    CBG (last 3)   Recent Labs  02/25/13 0829  GLUCAP 79    Scheduled Meds: . albuterol  2.5 mg Nebulization QID  . ALPRAZolam  0.25 mg Oral QHS  . aspirin EC  81 mg Oral QHS  . atorvastatin  10 mg Oral q1800  . carvedilol  3.125 mg Oral BID WC  . cefTRIAXone (ROCEPHIN)  IV  1 g Intravenous Q24H  . furosemide  40 mg Oral Daily  . hydroxychloroquine  200 mg Oral BID  . polyethylene glycol  17 g Oral Daily  . rivaroxaban  15 mg Oral Q supper  . rOPINIRole  2 mg Oral BID  . sodium chloride  3 mL Intravenous Q12H  . spironolactone  50 mg Oral Daily    Continuous Infusions:   Past  Medical History  Diagnosis Date  . Myalgia and myositis, unspecified   . Esophageal reflux   . Degeneration of intervertebral disc, site unspecified   . Morbid obesity   . Osteoarthrosis, unspecified whether generalized or localized, unspecified site   . Rheumatoid arthritis(714.0)   . Type II or unspecified type diabetes mellitus without mention of complication, not stated as uncontrolled   . CAD (coronary artery disease)   . Chronic kidney disease   . Thrombocytopenia   . Hypertension   . Hyperlipidemia   . OSA (obstructive sleep apnea)   . Interstitial lung disease   . Atrial fibrillation   . Anemia   . Vitamin D deficiency   . Cirrhosis     NASH  . Diabetes     Past Surgical History  Procedure Laterality Date  . Appendectomy    . Rotator cuff repair    . Replacement total knee     . Tubal ligation    . Ankle fusion    . Coronary angioplasty       Holly LatusHaley Hawkins RD, LDN

## 2013-02-25 NOTE — Progress Notes (Signed)
Family present and has been waiting for SW to come by, as Hospice nurse told family that she would have SW see them by 3 o'clock today. Writer spoke to SW after 1730 today, who stated that she did not receive call from Hospice nurse. Family stating they they will not be able to have pt discharged tomorrow, as she cannot stay at home alone and they must make other plans for pt.  SW stated that pt and family will be seen in morning.

## 2013-02-25 NOTE — Progress Notes (Signed)
GIP visit [facility] [phone] Holly [name] RN visit Holly RiasErin Osborne RN  Patient is alert and oriented to person and situation with confusion. Daughter in law at bedside. Daughter states that she is waiting for physician to make rounds. patient denies any pain or shortness of breath. Nurse Toniann FailWendy stated that Son expressed concerns about taking patient home last night. Son states the patient does not have enough care in the home to take her home. Will make SW on call aware of the concerns. No new notes from [facility] in the system.  Please call [facility] @ [phone]- ask for on-call RN with any hospice needs.  Thank you Holly RiasErin Osborne , RN

## 2013-02-25 NOTE — Progress Notes (Signed)
PROGRESS NOTE  Holly [name]:[id number] DOB: 05/15/1931 DOA: 02/23/2013 PCP: [name], MD  Assessment/Plan:  Delirium secondary to UTI. - she seems improved this morning, she is alert to place, person, time. Does not know the day of the week but that is normal per her son. Ammonia level normal.  - urine cultures resulted will switch to oral Abx, observe today and plan for d/c in am Atrial fibrillation. Goal will be rate control, since she is hospice status we'll switch her from Coumadin to xaralto as she will require less to no blood draws.  Acute on chronic grade 1 diastolic dysfunction in the setting of severe pulmonary hypertension (accounting for severe pro BNP rise) due to interstitial lung disease - on IV Lasix 40 mg daily, mild bump in Cr, will transition to oral tomorrow.  - continue 50 Aldactone, Coreg. Mildly elevated troponin.  - POC troponin elevated, subsequent enzymes negative.  Obstructive sleep apnea. Uses oxygen on as needed basis which we'll continue.  Type 2 diabetes mellitus  - A1C 6.0 last year; daily fasting CBGs Fibromyalgia and restless sick syndrome. Home medications will be continued.  NASH with cirrhosis. Diuretics as in #3 above. Not a good candidate for long-term statin.  Diet: heart Fluids: none DVT Prophylaxis: Xarelto  Code Status: DNR Family Communication: non this morning.  Disposition Plan: inpatient, home Monday  Consultants:  none  Procedures:  none   Antibiotics Ceftriaxone 1/16 >> 1/18. Ceftin 1/18 >>  HPI/Subjective: - feeling better this morning.   Objective: Filed Vitals:   02/24/13 1600 02/24/13 2048 02/24/13 2149 02/25/13 0406  BP:  113/73  110/56  Pulse:  68  67  Temp:  98 F (36.7 C)  98.6 F (37 C)  TempSrc:  Oral  Oral  Resp:  19  18  Height:      Weight:    79.5 kg (175 lb 4.3 oz)  SpO2: 100% 97% 96% 89%    Intake/Output Summary (Last 24 hours) at 02/25/13 0915 Last data filed at  02/24/13 2200  Gross per 24 hour  Intake    650 ml  Output      0 ml  Net    650 ml   Filed Weights   02/23/13 1334 02/24/13 0649 02/25/13 0406  Weight: 82.101 kg (181 lb) 83.6 kg (184 lb 4.9 oz) 79.5 kg (175 lb 4.3 oz)    Exam:  General:  NAD  Cardiovascular: regular rate and rhythm, without MRG  Respiratory: good air movement, no wheezing  Abdomen: soft, not tender to palpation  MSK: no peripheral edema  Neuro: non focal  Data Reviewed: Basic Metabolic Panel:  Recent Labs Lab 02/23/13 1600 02/24/13 0320 02/25/13 0525  NA 144 140 137  K 4.3 4.3 4.5  CL 107 103 101  CO2 17* 20 19  GLUCOSE 92 126* 89  BUN 44* 45* 52*  CREATININE 1.38* 1.53* 1.58*  CALCIUM 9.3 9.0 9.0   Liver Function Tests:  Recent Labs Lab 02/23/13 1600  AST 30  ALT 20  ALKPHOS 113  BILITOT 1.4*  PROT 7.6  ALBUMIN 4.0    Recent Labs Lab 02/23/13 2102  AMMONIA 20   CBC:  Recent Labs Lab 02/23/13 1600 02/24/13 0320 02/25/13 0525  WBC 8.3 7.1 6.6  NEUTROABS 6.0  --   --   HGB 13.6 12.6 12.6  HCT 41.2 38.8 39.0  MCV 87.7 87.8 88.2  PLT 101* 106* 104*   Cardiac Enzymes:  Recent Labs Lab  02/23/13 2143 02/24/13 0320 02/24/13 0909  TROPONINI <0.30 <0.30 <0.30   BNP (last 3 results)  Recent Labs  07/10/12 1025 10/02/12 2144 02/23/13 1600  PROBNP 28016.0* 3050.0* 24178.0*   Studies: Dg Chest 2 View  02/23/2013   CLINICAL DATA:  Cough, shortness of breath, confusion, history of interstitial lung disease, hypertension, diabetes  EXAM: CHEST  2 VIEW  COMPARISON:  07/12/2012; 07/09/2012; 12/20/2011; 10/02/2008; chest CT -10/16/2008  FINDINGS: The examination is degraded due to patient body habitus.  Grossly unchanged enlarged cardiac silhouette and mediastinal contours. Evaluation of the retrosternal clear space is obscured secondary to overlying soft tissues. Atherosclerotic plaque within the descending thoracic aorta. Post coronary stent placement. Peripherally  calcified circular structure within the right lung apex is favored to represent atherosclerotic plaque within the right brachiocephalic artery is demonstrated on remote chest CT. Pulmonary venous congestion without definite evidence of edema. Grossly unchanged perihilar and bilateral medial basilar opacities. No new focal airspace opacities. No definite pleural effusion or pneumothorax. Grossly unchanged bones including suspected bilateral rotator cuff tears.  IMPRESSION: Cardiomegaly and pulmonary venous congestion without definite acute cardiopulmonary disease. Specifically, no definite evidence of edema.   Electronically Signed   By: Simonne ComeJohn  Watts M.D.   On: 02/23/2013 16:47   Ct Head Wo Contrast  02/23/2013   CLINICAL DATA:  Altered level of consciousness, hallucinations  EXAM: CT HEAD WITHOUT CONTRAST  TECHNIQUE: Contiguous axial images were obtained from the base of the skull through the vertex without intravenous contrast.  COMPARISON:  11/11/2011  FINDINGS: No evidence of parenchymal hemorrhage or extra-axial fluid collection. No mass lesion, mass effect, or midline shift.  No CT evidence of acute infarction.  Encephalomalacic changes in the right parieto-occipital region, unchanged.  Subcortical white matter and periventricular small vessel ischemic changes. Intracranial atherosclerosis.  Cerebral volume is within normal limits.  No ventriculomegaly.  The visualized paranasal sinuses are essentially clear. The mastoid air cells are unopacified.  No evidence of calvarial fracture.  IMPRESSION: No evidence of acute intracranial abnormality.  Small vessel ischemic changes with intracranial atherosclerosis.   Electronically Signed   By: Charline BillsSriyesh  Krishnan M.D.   On: 02/23/2013 18:03   Scheduled Meds: . albuterol  2.5 mg Nebulization QID  . ALPRAZolam  0.25 mg Oral QHS  . aspirin EC  81 mg Oral QHS  . atorvastatin  10 mg Oral q1800  . carvedilol  3.125 mg Oral BID WC  . cefTRIAXone (ROCEPHIN)  IV  1 g  Intravenous Q24H  . furosemide  40 mg Oral Daily  . hydroxychloroquine  200 mg Oral BID  . polyethylene glycol  17 g Oral Daily  . rivaroxaban  15 mg Oral Q supper  . rOPINIRole  2 mg Oral BID  . sodium chloride  3 mL Intravenous Q12H  . spironolactone  50 mg Oral Daily   Continuous Infusions:   Active Problems:   DIABETES MELLITUS, TYPE II   Morbid obesity   CORONARY ARTERY DISEASE   PULMONARY HYPERTENSION   GERD   FIBROMYALGIA   OSA (obstructive sleep apnea)   Atrial fibrillation   Delirium   NASH (nonalcoholic steatohepatitis)  Time spent: 25  Pamella Pertostin Brianah Hopson, MD [facility] Pager [phone]. If 7 PM - 7 AM, please contact night-coverage at [email or web address], password [email or web address] 02/25/2013, 9:15 AM  LOS: 2 days

## 2013-02-25 NOTE — Progress Notes (Signed)
RT placed patient on home CPAP. Patient tolerating well at this time. RT will continue to monitor.

## 2013-02-26 LAB — GLUCOSE, CAPILLARY: Glucose-Capillary: 100 mg/dL — ABNORMAL HIGH (ref 70–99)

## 2013-02-26 MED ORDER — CEFUROXIME AXETIL 250 MG PO TABS: 250.0000 mg | ORAL_TABLET | Freq: Two times a day (BID) | ORAL | Status: DC

## 2013-02-26 MED ORDER — RIVAROXABAN 15 MG PO TABS: 15.0000 mg | ORAL_TABLET | Freq: Every day | ORAL | Status: DC

## 2013-02-26 MED ORDER — CARVEDILOL 3.125 MG PO TABS: 3.1250 mg | ORAL_TABLET | Freq: Two times a day (BID) | ORAL | Status: AC
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
## 2013-02-26 NOTE — Progress Notes (Signed)
Clinical Social Work Department BRIEF PSYCHOSOCIAL ASSESSMENT 02/26/2013  Patient:  Holly [name],Holly [name]     Account Number:  [id number]     Admit date:  02/23/2013  Clinical Social Worker:  Orpah GreekFOLEY,Clarity Ciszek, LCSWA  Date/Time:  02/26/2013 03:26 PM  Referred by:  Physician  Date Referred:  02/26/2013 Referred for  ALF Placement  SNF Placement  Other - See comment   Other Referral:   Interview type:  Patient Other interview type:   and son & daughter-in-law at bedside    PSYCHOSOCIAL DATA Living Status:  ALONE Admitted from facility:   Level of care:   Primary support name:  Holly [name] (son) ph#: [phone] Primary support relationship to patient:  CHILD, ADULT Degree of support available:   good    CURRENT CONCERNS Current Concerns  Post-Acute Placement   Other Concerns:    SOCIAL WORK ASSESSMENT / PLAN CSW received call from RN, Vinnie LangtonGretchen that patient's family would like to discuss options for discharge for patient.   Assessment/plan status:  No Further Intervention Required Other assessment/ plan:   Information/referral to community resources:   Patient declined information re: SNF/ALF/Independent living facilities.    PATIENT'S/FAMILY'S RESPONSE TO PLAN OF CARE: CSW spoke with patient with son & daughter-in-law at bedside re: living situation. Patient currently lives alone with private duty caregivers coming out from 8-12a & 6-9p, she does not feel like she is ready nor needs to go to [name] higher level of care at this time.    CSW had patient explain reasons that led up to her being admitted to the hospital. Patient states that she was not responsive and knew what was going on. Patient states that she has [name] life-alert and tried to push the button but no one responded.    Patient's family has been looking around to independent living facilities and [facility]. Patient had made arrangements to go there but then last minute decided against it and got [name] refund.  Son contacted them about availability and they assured him they still had 2 beds available on the first floor. Patient came up with [name] list of excuses as to why she couldn't go there just yet - "they have carpeting not hardwood floors, which are hard to get around in [name] wheelchair".    After speaking with the son outside of patient's room, son has decided that him and his wife will just have to let her be at home until she decides herself that she can no longer funtion safely at home alone.    CSW reassured son that that would be the best approach to the situation and informed Kirt BoysMolly (hospice social worker) of situation as well, that patient will be returning home with caregivers from 8-12 & 6-9 for the time being.       Unice BaileyKelly Foley, LCSW [facility] Clinical Social Worker cell #: [phone]

## 2013-02-26 NOTE — Progress Notes (Signed)
D/C instructions reviewed w/ pt and pt's son & dtr in law. Pt verbalizes understanding, all questions answered. Pt d/c in her own w/c in stable condition by NT. Pt in possession of d/c instructions, scripts for xarelto, coreg, and ceftin, and all personal belongings including her w/c and her own hoyer lift pad from home. Pt lifted to her own w/c via hoyer lift by this Clinical research associatewriter and NT.
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
## 2013-02-26 NOTE — Discharge Instructions (Signed)
You were cared for by a hospitalist during your hospital stay. If you have any questions about your discharge medications or the care you received while you were in the hospital after you are discharged, you can call the unit and asked to speak with the hospitalist on call if the hospitalist that took care of you is not available. Once you are discharged, your primary care physician will handle any further medical issues. Please note that NO REFILLS for any discharge medications will be authorized once you are discharged, as it is imperative that you return to your primary care physician (or establish a relationship with a primary care physician if you do not have one) for your aftercare needs so that they can reassess your need for medications and monitor your lab values.     If you do not have a primary care physician, you can call [phone] for a physician referral.  Follow with Primary MD [name], MD in 5-7 days   Get CBC, CMP checked by your doctor and again as further instructed.  Get a 2 view Chest X ray done next visit if you had Pneumonia of Lung problems at the Hospital.  Get Medicines reviewed and adjusted.  Please request your Prim.MD to go over all Hospital Tests and Procedure/Radiological results at the follow up, please get all Hospital records sent to your Prim MD by signing hospital release before you go home.  Activity: As tolerated with Full fall precautions use walker/cane & assistance as needed  Diet: heart healthy  For Heart failure patients - Check your Weight same time everyday, if you gain over 2 pounds, or you develop in leg swelling, experience more shortness of breath or chest pain, call your Primary MD immediately. Follow Cardiac Low Salt Diet and 1.8 lit/day fluid restriction.  Disposition Home  If you experience worsening of your admission symptoms, develop shortness of breath, life threatening emergency, suicidal or homicidal thoughts you must  seek medical attention immediately by calling 911 or calling your MD immediately  if symptoms less severe.  You Must read complete instructions/literature along with all the possible adverse reactions/side effects for all the Medicines you take and that have been prescribed to you. Take any new Medicines after you have completely understood and accpet all the possible adverse reactions/side effects.   Do not drive and provide baby sitting services if your were admitted for syncope or siezures until you have seen by Primary MD or a Neurologist and advised to do so again.  Do not drive when taking Pain medications.   Do not take more than prescribed Pain, Sleep and Anxiety Medications  Special Instructions: If you have smoked or chewed Tobacco  in the last 2 yrs please stop smoking, stop any regular Alcohol  and or any Recreational drug use.  Wear Seat belts while driving.  Urinary Tract Infection A urinary tract infection (UTI) can occur any place along the urinary tract. The tract includes the kidneys, ureters, bladder, and urethra. A type of germ called bacteria often causes a UTI. UTIs are often helped with antibiotic medicine.  HOME CARE   If given, take antibiotics as told by your doctor. Finish them even if you start to feel better.  Drink enough fluids to keep your pee (urine) clear or pale yellow.  Avoid tea, drinks with caffeine, and bubbly (carbonated) drinks.  Pee often. Avoid holding your pee in for a long time.  Pee before and after having sex (intercourse).  Wipe from front  to back after you poop (bowel movement) if you are a woman. Use each tissue only once. GET HELP RIGHT AWAY IF:   You have back pain.  You have lower belly (abdominal) pain.  You have chills.  You feel sick to your stomach (nauseous).  You throw up (vomit).  Your burning or discomfort with peeing does not go away.  You have a fever.  Your symptoms are not better in 3 days. MAKE SURE YOU:     Understand these instructions.  Will watch your condition.  Will get help right away if you are not doing well or get worse. Document Released: 07/14/2007 Document Revised: 10/20/2011 Document Reviewed: 08/26/2011 [facility] Patient Information 2014 [employer] [place].

## 2013-02-26 NOTE — Progress Notes (Signed)
I tried to help put the patient on her home CPAP machine tonight, however she was not tolerating it. Patient states she felt like she couldn't breathe and the mask pulls her hair too much. Patient did not tolerate the machine any more than 5 min. I placed the patient back on 3 lpm [place] and she is feeling much better, trying to go to sleep and rest at the moment. Patient is aware to call if she does change her mind and wants to try to wear the machine again. RT will continue to assist as needed.

## 2013-02-28 ENCOUNTER — Ambulatory Visit: Payer: Self-pay | Admitting: Cardiovascular Disease

## 2013-02-28 ENCOUNTER — Telehealth: Payer: Self-pay | Admitting: Cardiology

## 2013-02-28 DIAGNOSIS — Z79899 Other long term (current) drug therapy: Secondary | ICD-10-CM

## 2013-02-28 DIAGNOSIS — I4891 Unspecified atrial fibrillation: Secondary | ICD-10-CM

## 2013-02-28 NOTE — Telephone Encounter (Signed)
TO Dr Mayford Knifeurner and Riki RuskJeremy to make aware.

## 2013-02-28 NOTE — Telephone Encounter (Signed)
CrCl is ~ 40 ml/min and hemoglobin 12.6, so Xarelto 15 mg daily is appropriate to start on.  Please notify patient that we would like a BMET and CBC checked in 4 weeks since starting Xarelto.  Thanks.

## 2013-02-28 NOTE — Telephone Encounter (Signed)
agree

## 2013-02-28 NOTE — Telephone Encounter (Signed)
New message    FYI Pt coumadin was discontinued when she was in the hosp.  She was started on xeralto.  Home health will not be drawing INR

## 2013-03-01 NOTE — Telephone Encounter (Signed)
Also reminded pt that we moved to a new location and gave caregiver address for pt.

## 2013-03-01 NOTE — Telephone Encounter (Signed)
Pt is aware of Lab work needed and that it is scheduled.

## 2013-03-28 ENCOUNTER — Emergency Department (HOSPITAL_COMMUNITY)

## 2013-03-28 ENCOUNTER — Encounter (HOSPITAL_COMMUNITY): Payer: Self-pay | Admitting: Emergency Medicine

## 2013-03-28 ENCOUNTER — Inpatient Hospital Stay (HOSPITAL_COMMUNITY)
Admission: EM | Admit: 2013-03-28 | Discharge: 2013-04-02 | DRG: 291 | Disposition: A | Discharge status: Skilled Nursing Facility | Attending: Internal Medicine | Admitting: Internal Medicine

## 2013-03-28 DIAGNOSIS — K7460 Unspecified cirrhosis of liver: Secondary | ICD-10-CM

## 2013-03-28 DIAGNOSIS — R4189 Other symptoms and signs involving cognitive functions and awareness: Secondary | ICD-10-CM

## 2013-03-28 DIAGNOSIS — E876 Hypokalemia: Secondary | ICD-10-CM | POA: Diagnosis present

## 2013-03-28 DIAGNOSIS — N390 Urinary tract infection, site not specified: Secondary | ICD-10-CM | POA: Diagnosis present

## 2013-03-28 DIAGNOSIS — Z87891 Personal history of nicotine dependence: Secondary | ICD-10-CM

## 2013-03-28 DIAGNOSIS — E119 Type 2 diabetes mellitus without complications: Secondary | ICD-10-CM | POA: Diagnosis present

## 2013-03-28 DIAGNOSIS — B965 Pseudomonas (aeruginosa) (mallei) (pseudomallei) as the cause of diseases classified elsewhere: Secondary | ICD-10-CM | POA: Diagnosis present

## 2013-03-28 DIAGNOSIS — N179 Acute kidney failure, unspecified: Secondary | ICD-10-CM

## 2013-03-28 DIAGNOSIS — K219 Gastro-esophageal reflux disease without esophagitis: Secondary | ICD-10-CM | POA: Diagnosis present

## 2013-03-28 DIAGNOSIS — Z9981 Dependence on supplemental oxygen: Secondary | ICD-10-CM

## 2013-03-28 DIAGNOSIS — R410 Disorientation, unspecified: Secondary | ICD-10-CM

## 2013-03-28 DIAGNOSIS — F0390 Unspecified dementia without behavioral disturbance: Secondary | ICD-10-CM | POA: Diagnosis present

## 2013-03-28 DIAGNOSIS — G4733 Obstructive sleep apnea (adult) (pediatric): Secondary | ICD-10-CM | POA: Diagnosis present

## 2013-03-28 DIAGNOSIS — N189 Chronic kidney disease, unspecified: Secondary | ICD-10-CM | POA: Diagnosis present

## 2013-03-28 DIAGNOSIS — Z7982 Long term (current) use of aspirin: Secondary | ICD-10-CM

## 2013-03-28 DIAGNOSIS — Z6835 Body mass index (BMI) 35.0-35.9, adult: Secondary | ICD-10-CM

## 2013-03-28 DIAGNOSIS — I509 Heart failure, unspecified: Secondary | ICD-10-CM | POA: Diagnosis present

## 2013-03-28 DIAGNOSIS — I4891 Unspecified atrial fibrillation: Secondary | ICD-10-CM | POA: Diagnosis present

## 2013-03-28 DIAGNOSIS — E875 Hyperkalemia: Secondary | ICD-10-CM | POA: Diagnosis present

## 2013-03-28 DIAGNOSIS — K7689 Other specified diseases of liver: Secondary | ICD-10-CM | POA: Diagnosis present

## 2013-03-28 DIAGNOSIS — B952 Enterococcus as the cause of diseases classified elsewhere: Secondary | ICD-10-CM | POA: Diagnosis present

## 2013-03-28 DIAGNOSIS — I2510 Atherosclerotic heart disease of native coronary artery without angina pectoris: Secondary | ICD-10-CM | POA: Diagnosis present

## 2013-03-28 DIAGNOSIS — K7581 Nonalcoholic steatohepatitis (NASH): Secondary | ICD-10-CM | POA: Diagnosis present

## 2013-03-28 DIAGNOSIS — F29 Unspecified psychosis not due to a substance or known physiological condition: Secondary | ICD-10-CM

## 2013-03-28 DIAGNOSIS — I5033 Acute on chronic diastolic (congestive) heart failure: Principal | ICD-10-CM | POA: Diagnosis present

## 2013-03-28 DIAGNOSIS — I129 Hypertensive chronic kidney disease with stage 1 through stage 4 chronic kidney disease, or unspecified chronic kidney disease: Secondary | ICD-10-CM | POA: Diagnosis present

## 2013-03-28 DIAGNOSIS — G9340 Encephalopathy, unspecified: Secondary | ICD-10-CM | POA: Diagnosis present

## 2013-03-28 DIAGNOSIS — Z9861 Coronary angioplasty status: Secondary | ICD-10-CM

## 2013-03-28 DIAGNOSIS — I482 Chronic atrial fibrillation, unspecified: Secondary | ICD-10-CM | POA: Diagnosis present

## 2013-03-28 LAB — CBC WITH DIFFERENTIAL/PLATELET
Basophils Absolute: 0 10*3/uL (ref 0.0–0.1)
Basophils Relative: 0 % (ref 0–1)
EOS PCT: 2 % (ref 0–5)
Eosinophils Absolute: 0.1 10*3/uL (ref 0.0–0.7)
HCT: 40.1 % (ref 36.0–46.0)
HEMOGLOBIN: 13.3 g/dL (ref 12.0–15.0)
LYMPHS ABS: 1.8 10*3/uL (ref 0.7–4.0)
Lymphocytes Relative: 26 % (ref 12–46)
MCH: 29.8 pg (ref 26.0–34.0)
MCHC: 33.2 g/dL (ref 30.0–36.0)
MCV: 89.9 fL (ref 78.0–100.0)
MONO ABS: 0.6 10*3/uL (ref 0.1–1.0)
Monocytes Relative: 8 % (ref 3–12)
Neutro Abs: 4.5 10*3/uL (ref 1.7–7.7)
Neutrophils Relative %: 64 % (ref 43–77)
Platelets: 96 10*3/uL — ABNORMAL LOW (ref 150–400)
RBC: 4.46 MIL/uL (ref 3.87–5.11)
RDW: 19.3 % — ABNORMAL HIGH (ref 11.5–15.5)
WBC: 7 10*3/uL (ref 4.0–10.5)

## 2013-03-28 LAB — URINE MICROSCOPIC-ADD ON

## 2013-03-28 LAB — BLOOD GAS, ARTERIAL
Acid-base deficit: 6.3 mmol/L — ABNORMAL HIGH (ref 0.0–2.0)
Bicarbonate: 17.5 mEq/L — ABNORMAL LOW (ref 20.0–24.0)
Drawn by: 331471
O2 Content: 3 L/min
O2 SAT: 92.5 %
PO2 ART: 70.1 mmHg — AB (ref 80.0–100.0)
Patient temperature: 98.6
TCO2: 15.6 mmol/L (ref 0–100)
pCO2 arterial: 31.1 mmHg — ABNORMAL LOW (ref 35.0–45.0)
pH, Arterial: 7.369 (ref 7.350–7.450)

## 2013-03-28 LAB — URINALYSIS, ROUTINE W REFLEX MICROSCOPIC
Bilirubin Urine: NEGATIVE
Glucose, UA: NEGATIVE mg/dL
Ketones, ur: NEGATIVE mg/dL
NITRITE: NEGATIVE
PH: 5 (ref 5.0–8.0)
Protein, ur: NEGATIVE mg/dL
Specific Gravity, Urine: 1.01 (ref 1.005–1.030)
Urobilinogen, UA: 1 mg/dL (ref 0.0–1.0)

## 2013-03-28 LAB — COMPREHENSIVE METABOLIC PANEL
ALK PHOS: 103 U/L (ref 39–117)
ALT: 17 U/L (ref 0–35)
AST: 26 U/L (ref 0–37)
Albumin: 3.8 g/dL (ref 3.5–5.2)
BILIRUBIN TOTAL: 1.3 mg/dL — AB (ref 0.3–1.2)
BUN: 66 mg/dL — AB (ref 6–23)
CO2: 18 mEq/L — ABNORMAL LOW (ref 19–32)
Calcium: 9.4 mg/dL (ref 8.4–10.5)
Chloride: 100 mEq/L (ref 96–112)
Creatinine, Ser: 1.4 mg/dL — ABNORMAL HIGH (ref 0.50–1.10)
GFR calc Af Amer: 40 mL/min — ABNORMAL LOW (ref >90)
GFR calc non Af Amer: 35 mL/min — ABNORMAL LOW (ref >90)
GLUCOSE: 119 mg/dL — AB (ref 70–99)
Potassium: 5.7 mEq/L — ABNORMAL HIGH (ref 3.7–5.3)
Sodium: 133 mEq/L — ABNORMAL LOW (ref 137–147)
TOTAL PROTEIN: 7.7 g/dL (ref 6.0–8.3)

## 2013-03-28 LAB — RAPID URINE DRUG SCREEN, HOSP PERFORMED
AMPHETAMINES: NOT DETECTED
Barbiturates: NOT DETECTED
Benzodiazepines: NOT DETECTED
Cocaine: NOT DETECTED
OPIATES: POSITIVE — AB
Tetrahydrocannabinol: NOT DETECTED

## 2013-03-28 LAB — AMMONIA: Ammonia: 34 umol/L (ref 11–60)

## 2013-03-28 LAB — POTASSIUM: Potassium: 5.5 mEq/L — ABNORMAL HIGH (ref 3.7–5.3)

## 2013-03-28 LAB — PRO B NATRIURETIC PEPTIDE: PRO B NATRI PEPTIDE: 15398 pg/mL — AB (ref 0–450)

## 2013-03-28 LAB — ETHANOL: Alcohol, Ethyl (B): <11 mg/dL (ref 0–11)

## 2013-03-28 LAB — GLUCOSE, CAPILLARY: Glucose-Capillary: 171 mg/dL — ABNORMAL HIGH (ref 70–99)

## 2013-03-28 LAB — LACTIC ACID, PLASMA: LACTIC ACID, VENOUS: 1 mmol/L (ref 0.5–2.2)

## 2013-03-28 MED ORDER — HYDROXYCHLOROQUINE SULFATE 200 MG PO TABS
200.0000 mg | ORAL_TABLET | Freq: Every day | ORAL | Status: DC
  Administered 2013-03-29 – 2013-04-02 (×5): 200 mg via ORAL
  Filled 2013-03-28 (×7): qty 1

## 2013-03-28 MED ORDER — ONDANSETRON HCL 4 MG PO TABS
4.0000 mg | ORAL_TABLET | Freq: Four times a day (QID) | ORAL | Status: DC | PRN
  Administered 2013-04-02 (×2): 4 mg via ORAL
  Filled 2013-03-28 (×2): qty 1

## 2013-03-28 MED ORDER — ONDANSETRON HCL 4 MG/2ML IJ SOLN
4.0000 mg | Freq: Four times a day (QID) | INTRAMUSCULAR | Status: DC | PRN
  Administered 2013-04-01: 4 mg via INTRAVENOUS
  Filled 2013-03-28: qty 2

## 2013-03-28 MED ORDER — FUROSEMIDE 10 MG/ML IJ SOLN
40.0000 mg | Freq: Once | INTRAMUSCULAR | Status: AC
  Administered 2013-03-28: 40 mg via INTRAVENOUS
  Filled 2013-03-28: qty 4

## 2013-03-28 MED ORDER — ALPRAZOLAM 0.25 MG PO TABS
0.2500 mg | ORAL_TABLET | Freq: Every evening | ORAL | Status: DC | PRN
  Administered 2013-03-29 (×2): 0.25 mg via ORAL
  Filled 2013-03-28 (×3): qty 1

## 2013-03-28 MED ORDER — INSULIN ASPART 100 UNIT/ML ~~LOC~~ SOLN: 0.0000 [IU] | Freq: Every day | SUBCUTANEOUS | Status: DC

## 2013-03-28 MED ORDER — ASPIRIN EC 81 MG PO TBEC
81.0000 mg | DELAYED_RELEASE_TABLET | Freq: Every day | ORAL | Status: DC
  Administered 2013-03-29 – 2013-04-01 (×5): 81 mg via ORAL
  Filled 2013-03-28 (×7): qty 1

## 2013-03-28 MED ORDER — SODIUM CHLORIDE 0.9 % IJ SOLN
3.0000 mL | Freq: Two times a day (BID) | INTRAMUSCULAR | Status: DC
  Administered 2013-03-30 – 2013-04-02 (×5): 3 mL via INTRAVENOUS

## 2013-03-28 MED ORDER — HYDROXYZINE HCL 10 MG PO TABS
10.0000 mg | ORAL_TABLET | Freq: Four times a day (QID) | ORAL | Status: DC | PRN
  Administered 2013-04-01 (×2): 10 mg via ORAL
  Filled 2013-03-28 (×3): qty 1

## 2013-03-28 MED ORDER — LEVALBUTEROL HCL 0.63 MG/3ML IN NEBU: 0.6300 mg | INHALATION_SOLUTION | RESPIRATORY_TRACT | Status: DC | PRN

## 2013-03-28 MED ORDER — FUROSEMIDE 20 MG PO TABS
20.0000 mg | ORAL_TABLET | Freq: Every day | ORAL | Status: DC
  Administered 2013-03-29: 20 mg via ORAL
  Filled 2013-03-28: qty 1

## 2013-03-28 MED ORDER — ACETAMINOPHEN 650 MG RE SUPP: 650.0000 mg | Freq: Four times a day (QID) | RECTAL | Status: DC | PRN

## 2013-03-28 MED ORDER — ACETAMINOPHEN 325 MG PO TABS: 650.0000 mg | ORAL_TABLET | Freq: Four times a day (QID) | ORAL | Status: DC | PRN

## 2013-03-28 MED ORDER — SODIUM CHLORIDE 0.9 % IV SOLN: 250.0000 mL | INTRAVENOUS | Status: DC | PRN

## 2013-03-28 MED ORDER — SODIUM CHLORIDE 0.9 % IV SOLN: 1000.0000 mL | INTRAVENOUS | Status: DC

## 2013-03-28 MED ORDER — HYDROCODONE-ACETAMINOPHEN 10-325 MG PO TABS
1.0000 | ORAL_TABLET | Freq: Two times a day (BID) | ORAL | Status: DC | PRN
  Administered 2013-03-29 – 2013-04-02 (×8): 1 via ORAL
  Filled 2013-03-28 (×8): qty 1

## 2013-03-28 MED ORDER — SODIUM CHLORIDE 0.9 % IJ SOLN: 3.0000 mL | INTRAMUSCULAR | Status: DC | PRN

## 2013-03-28 MED ORDER — POLYETHYLENE GLYCOL 3350 17 G PO PACK
17.0000 g | PACK | Freq: Every day | ORAL | Status: DC
  Administered 2013-03-29 – 2013-04-01 (×4): 17 g via ORAL
  Filled 2013-03-28 (×7): qty 1

## 2013-03-28 MED ORDER — SODIUM CHLORIDE 0.9 % IJ SOLN
3.0000 mL | Freq: Two times a day (BID) | INTRAMUSCULAR | Status: DC
  Administered 2013-03-29 – 2013-03-31 (×3): 3 mL via INTRAVENOUS

## 2013-03-28 MED ORDER — ROPINIROLE HCL 1 MG PO TABS
1.0000 mg | ORAL_TABLET | Freq: Two times a day (BID) | ORAL | Status: DC
  Administered 2013-03-29 – 2013-04-02 (×10): 1 mg via ORAL
  Filled 2013-03-28 (×14): qty 1

## 2013-03-28 MED ORDER — CARVEDILOL 3.125 MG PO TABS
3.1250 mg | ORAL_TABLET | Freq: Two times a day (BID) | ORAL | Status: DC
  Administered 2013-03-29 – 2013-04-02 (×9): 3.125 mg via ORAL
  Filled 2013-03-28 (×13): qty 1

## 2013-03-28 MED ORDER — DEXTROSE 5 % IV SOLN
1.0000 g | Freq: Every day | INTRAVENOUS | Status: DC
  Filled 2013-03-28: qty 10

## 2013-03-28 MED ORDER — RIVAROXABAN 15 MG PO TABS
15.0000 mg | ORAL_TABLET | Freq: Every day | ORAL | Status: DC
  Administered 2013-03-29 – 2013-04-01 (×4): 15 mg via ORAL
  Filled 2013-03-28 (×6): qty 1

## 2013-03-28 MED ORDER — INSULIN ASPART 100 UNIT/ML ~~LOC~~ SOLN
0.0000 [IU] | Freq: Three times a day (TID) | SUBCUTANEOUS | Status: DC
  Administered 2013-03-29 (×2): 1 [IU] via SUBCUTANEOUS
  Administered 2013-03-30: 5 [IU] via SUBCUTANEOUS
  Administered 2013-03-31 (×2): 1 [IU] via SUBCUTANEOUS
  Administered 2013-04-01: 2 [IU] via SUBCUTANEOUS

## 2013-03-28 MED ORDER — DEXTROSE 5 % IV SOLN
1.0000 g | Freq: Once | INTRAVENOUS | Status: AC
  Administered 2013-03-28: 1 g via INTRAVENOUS
  Filled 2013-03-28: qty 10

## 2013-03-28 NOTE — ED Notes (Signed)
Pt sent from PCP, Family states pt is having AMS, increased confusion, hallucinations, and increased hypoxia per PCP.

## 2013-03-28 NOTE — ED Notes (Signed)
Patient transported to CT 
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
## 2013-03-28 NOTE — ED Notes (Addendum)
Holly [name], son/POA [phone]. Holly [name] reports will bring DNR paperwork tomorrow.

## 2013-03-28 NOTE — BH Assessment (Signed)
Spoke with EDP Dr. Effie ShyWentz face to face who is requesting that patient be evaluated specifically for competency by a psychiatrist. No TTS consult required at this time per EDP. Informed EDP Dr. Effie ShyWentz that patient consult has been requested. Spoke with Doylene Canninghomas Hughes who confirmed that Psychiatric consult for competency has been requested and recorded in Consult Book.  Glorious PeachNajah Teofilo Lupinacci, MS, LCASA Assessment Counselor
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
## 2013-03-28 NOTE — H&P (Signed)
[facility] History and Physical  Holly [name] JWJ:[id number] DOB: 1931/09/21 DOA: 03/28/2013  Referring physician: EDP PCP: [name], MD   Chief Complaint:   HPI: Holly [name] is a 80 y.o. female with multiple medical problems including type 2 diabetes mellitus, paroxysmal atrial fibrillation, CAD, CKD, type 2 diabetes mellitus(no meds listed on med rec), OSA, O2 dependent, Holly [name], who lives at home alone brought in by family with progressive confusion over the past several months. The family confusion recently  worsened they her to her PCP who referred her to the ED today. Patient admits to occasional shortness of breath. She denies chest pain, cough, fevers, dysuria melena and no hematochezia. In the ED chest x-ray show slight interstitial prominence mild CHF not excluded CAD stent is noted, urinalysis consistent with UTI, BNP 15,398 (down from 24,000 January 2015). Her repeat potassium is still elevated at 5.5, her creatinine was 1.4 (from 1.58 on 1/18) she is admitted for further evaluation and management.    Review of Systems The patient denies anorexia, fever, weight loss,, vision loss, decreased hearing, hoarseness, chest pain, syncope, dyspnea on exertion, peripheral edema,hemoptysis, abdominal pain, melena, hematochezia, severe indigestion/heartburn, hematuria, incontinence, genital sores, suspicious skin lesions, transient blindness,  depression, unusual weight change    Past Medical History  Diagnosis Date  . Myalgia and myositis, unspecified   . Esophageal reflux   . Degeneration of intervertebral disc, site unspecified   . Morbid obesity   . Osteoarthrosis, unspecified whether generalized or localized, unspecified site   . Rheumatoid arthritis(714.0)   . Type II or unspecified type diabetes mellitus without mention of complication, not stated as uncontrolled   . CAD (coronary artery disease)   . Chronic kidney disease   . Thrombocytopenia   .  Hypertension   . Hyperlipidemia   . OSA (obstructive sleep apnea)   . Interstitial lung disease   . Atrial fibrillation   . Anemia   . Vitamin D deficiency   . Cirrhosis     NASH  . Diabetes    Past Surgical History  Procedure Laterality Date  . Appendectomy    . Rotator cuff repair    . Replacement total knee    . Tubal ligation    . Ankle fusion    . Coronary angioplasty     Social History:  reports that she quit smoking about 36 years ago. Her smoking use included Cigarettes. She has a 17 pack-year smoking history. She has never used smokeless tobacco. She reports that she does not drink alcohol or use illicit drugs.  Allergies  Allergen Reactions  . Amoxicillin-Pot Clavulanate     Diarrhea ,fatigue mouth sores   . Doxycycline Hyclate   . Iodinated Diagnostic Agents     Full body rash   . Nitrofuran Derivatives   . Norvasc [Amlodipine Besylate]     Leg swelling  . Plavix [Clopidogrel Bisulfate]     REACTION: SOB, and rash  . Sulfa Antibiotics     And adhesive tape  . Phenergan [Promethazine Hcl] Anxiety    Family History  Problem Relation Age of Onset  . Rheum arthritis Mother      Prior to Admission medications   Medication Sig Start Date End Date Taking? Authorizing Provider  ALPRAZolam (XANAX) 0.25 MG tablet Take 1 tablet (0.25 mg total) by mouth at bedtime as needed for anxiety or sleep. 07/17/12  Yes Laveda Norman, MD  aspirin EC 81 MG tablet Take 81 mg by mouth at bedtime.  Yes Historical Provider, MD  carvedilol (COREG) 3.125 MG tablet Take 1 tablet (3.125 mg total) by mouth 2 (two) times daily with a meal. 02/26/13  Yes Costin Otelia SergeantM Gherghe, MD  furosemide (LASIX) 20 MG tablet Take 1 tablet (20 mg total) by mouth daily. 10/04/12  Yes Henderson CloudEstela Y Hernandez Acosta, MD  HYDROcodone-acetaminophen (LORTAB) 10-500 MG per tablet Take 1 tablet by mouth 2 (two) times daily as needed for pain.    Yes Historical Provider, MD  hydroxychloroquine (PLAQUENIL) 200 MG tablet Take  200 mg by mouth daily.    Yes Historical Provider, MD  hydrOXYzine (ATARAX/VISTARIL) 10 MG tablet Take 1 tablet (10 mg total) by mouth every 6 (six) hours as needed for itching. 07/17/12  Yes Laveda Normanhris N Oti, MD  levalbuterol Pauline Aus(XOPENEX) 0.63 MG/3ML nebulizer solution Take 3 mLs (0.63 mg total) by nebulization every 4 (four) hours as needed for wheezing or shortness of breath. 07/17/12  Yes Laveda Normanhris N Oti, MD  polyethylene glycol [facility](MIRALAX / GLYCOLAX) packet Take 17 g by mouth daily. 07/17/12  Yes Laveda Normanhris N Oti, MD  Rivaroxaban (XARELTO) 15 MG TABS tablet Take 1 tablet (15 mg total) by mouth daily with supper. 02/26/13  Yes Costin Otelia SergeantM Gherghe, MD  rOPINIRole (REQUIP) 0.5 MG tablet Take 1 mg by mouth 2 (two) times daily.    Yes Historical Provider, MD  spironolactone (ALDACTONE) 50 MG tablet Take 1 tablet (50 mg total) by mouth daily. 10/04/12  Yes Henderson CloudEstela Y Hernandez Acosta, MD   Physical Exam: Filed Vitals:   03/28/13 1945  BP:   Pulse: 62  Temp:   Resp: 16    BP 116/65  Pulse 62  Temp(Src) 98.6 F (37 C) (Oral)  Resp 16  SpO2 97% Constitutional: Vital signs reviewed.  Patient is a well-developed, obese in no acute distress and cooperative with exam. Alert and oriented x3.  Head: Normocephalic and atraumatic Ear: TM normal bilaterally Mouth: no erythema or exudates, MMM Eyes: PERRL, EOMI, conjunctivae normal, No scleral icterus.  Neck: Supple, Trachea midline normal ROM, No JVD, mass, thyromegaly, or carotid bruit present.  Cardiovascular: RRR, S1 normal, S2 normal, no MRG, pulses symmetric and intact bilaterally Pulmonary/Chest: Few basilar crackles, no wheezes, or rhonchi Abdominal: Soft. Non-tender, non-distended, bowel sounds are normal, no masses, organomegaly, or guarding present.  GU: no CVA tenderness Extremities: trace edema, no cyanosis   Neurological: A&O x3, cranial nerve II-XII are grossly intact, no focal motor deficit. Skin: Warm, dry and intact. No rash.  Psychiatric: Normal mood and  affect.             Labs on Admission:  Basic Metabolic Panel:  Recent Labs Lab 03/28/13 1535 03/28/13 1821  NA 133*  --   K 5.7* 5.5*  CL 100  --   CO2 18*  --   GLUCOSE 119*  --   BUN 66*  --   CREATININE 1.40*  --   CALCIUM 9.4  --    Liver Function Tests:  Recent Labs Lab 03/28/13 1535  AST 26  ALT 17  ALKPHOS 103  BILITOT 1.3*  PROT 7.7  ALBUMIN 3.8   No results found for this basename: LIPASE, AMYLASE,  in the last 168 hours  Recent Labs Lab 03/28/13 1535  AMMONIA 34   CBC:  Recent Labs Lab 03/28/13 1535  WBC 7.0  NEUTROABS 4.5  HGB 13.3  HCT 40.1  MCV 89.9  PLT 96*   Cardiac Enzymes: No results found for this basename: CKTOTAL, CKMB, CKMBINDEX,  TROPONINI,  in the last 168 hours  BNP (last 3 results)  Recent Labs  10/02/12 2144 02/23/13 1600 03/28/13 1821  PROBNP 3050.0* 24178.0* 15398.0*   CBG: No results found for this basename: GLUCAP,  in the last 168 hours  Radiological Exams on Admission: Dg Chest 2 View  03/28/2013   CLINICAL DATA:  Shortness of breath.  EXAM: CHEST  2 VIEW  COMPARISON:  DG CHEST 2 VIEW dated 02/23/2013; DG CHEST 1V PORT dated 07/12/2012; DG CHEST 1V PORT dated 07/11/2012  FINDINGS: Mediastinum and hilar structures are stable. Persistent cardiomegaly and mild pulmonary vascular prominence noted. Slight interstitial prominence noted. A component of congestive heart failure cannot be excluded. Coronary artery stents noted. Poor inspiration with basilar atelectasis and/or infiltrate. No pleural effusion or pneumothorax. Diffuse degenerative change in osteopenia thoracic spine. Degenerative changes thoracic spine and both shoulders. Thoracic spine compression fractures noted.  IMPRESSION: 1. Mild congestive heart failure cannot be excluded. Coronary artery stents noted. 2. Poor inspiration with basilar atelectasis and/or infiltrates/ edema.   Electronically Signed   By: Maisie Fus  Register   On: 03/28/2013 15:47   Ct Head Wo  Contrast  03/28/2013   CLINICAL DATA:  Altered mental status  EXAM: CT HEAD WITHOUT CONTRAST  TECHNIQUE: Contiguous axial images were obtained from the base of the skull through the vertex without intravenous contrast.  COMPARISON:  CT 02/23/2013  FINDINGS: Chronic infarct right occipital parietal lobe is unchanged. Chronic microvascular ischemic change in the white matter.  Negative for acute infarct. Negative for hemorrhage or mass. Atherosclerotic calcification. No acute skull abnormality.  IMPRESSION: Chronic ischemia.  No acute abnormality.   Electronically Signed   By: Marlan Palau M.D.   On: 03/28/2013 16:20    EKG: Independently reviewed.-Atrial fibrillation at rate of 62, no acute ischemic changes  Assessment/Plan Active Problems:   UTI (lower urinary tract infection) -Obtain urine cultures, and Rocephin and Encephalopathy, possible acute toxic on probable underlying dementia -#1 contributing factor, treat as above and follow -Social work consult -CT head with no acute findings Acute on chronic diastolic CHF -Last 2-D echo will June of 2014 with EF 55-60%,  x-ray as above with interstitial prominence, BNP elevated though improved from last one in January -Patient received IV Lasix in the ED, will continue by mouth -will hold spironolactone for now given the hyperkalemia -Monitor Strict I.'s and O.'s, daily weights, follow and further treat accordingly   Hyperkalemia -Given iv Lasix in the ED, follow recheck and further treat accordingly -Holding spironolactone as above   DIABETES MELLITUS, TYPE II -Monitor Accu-Cheks check A1c -With sliding scale insulin   GERD -Asymptomatic, follow   OSA (obstructive sleep apnea) -Continue supplemental oxygen   Atrial fibrillation -Rate controlled, continue coreg -Continue xarelto CKD -Stable, monitor closely on placing  H/O NASH (nonalcoholic steatohepatitis) -Stable     Code Status:full Family Communication: none at  bedside Disposition Plan: Admit to telemetry  Time spent: >34mins  Kela Millin [facility] Pager [phone]

## 2013-03-29 ENCOUNTER — Other Ambulatory Visit: Payer: Medicare Other

## 2013-03-29 ENCOUNTER — Inpatient Hospital Stay (HOSPITAL_COMMUNITY)

## 2013-03-29 DIAGNOSIS — N189 Chronic kidney disease, unspecified: Secondary | ICD-10-CM

## 2013-03-29 DIAGNOSIS — N179 Acute kidney failure, unspecified: Secondary | ICD-10-CM

## 2013-03-29 DIAGNOSIS — F09 Unspecified mental disorder due to known physiological condition: Secondary | ICD-10-CM

## 2013-03-29 LAB — BASIC METABOLIC PANEL
BUN: 66 mg/dL — ABNORMAL HIGH (ref 6–23)
CHLORIDE: 99 meq/L (ref 96–112)
CO2: 18 meq/L — AB (ref 19–32)
Calcium: 9.1 mg/dL (ref 8.4–10.5)
Creatinine, Ser: 1.42 mg/dL — ABNORMAL HIGH (ref 0.50–1.10)
GFR calc Af Amer: 40 mL/min — ABNORMAL LOW (ref >90)
GFR calc non Af Amer: 34 mL/min — ABNORMAL LOW (ref >90)
GLUCOSE: 85 mg/dL (ref 70–99)
Potassium: 5.2 mEq/L (ref 3.7–5.3)
SODIUM: 134 meq/L — AB (ref 137–147)

## 2013-03-29 LAB — TROPONIN I
Troponin I: <0.3 ng/mL (ref <0.30)
Troponin I: <0.3 ng/mL (ref <0.30)

## 2013-03-29 LAB — CBC
HEMATOCRIT: 40.2 % (ref 36.0–46.0)
Hemoglobin: 13 g/dL (ref 12.0–15.0)
MCH: 29.1 pg (ref 26.0–34.0)
MCHC: 32.3 g/dL (ref 30.0–36.0)
MCV: 90.1 fL (ref 78.0–100.0)
Platelets: 106 10*3/uL — ABNORMAL LOW (ref 150–400)
RBC: 4.46 MIL/uL (ref 3.87–5.11)
RDW: 19.1 % — ABNORMAL HIGH (ref 11.5–15.5)
WBC: 8 10*3/uL (ref 4.0–10.5)

## 2013-03-29 LAB — GLUCOSE, CAPILLARY
GLUCOSE-CAPILLARY: 120 mg/dL — AB (ref 70–99)
GLUCOSE-CAPILLARY: 123 mg/dL — AB (ref 70–99)
Glucose-Capillary: 85 mg/dL (ref 70–99)
Glucose-Capillary: 125 mg/dL — ABNORMAL HIGH (ref 70–99)
Glucose-Capillary: 117 mg/dL — ABNORMAL HIGH (ref 70–99)

## 2013-03-29 LAB — HEMOGLOBIN A1C
Hgb A1c MFr Bld: 6.2 % — ABNORMAL HIGH (ref <5.7)
Mean Plasma Glucose: 131 mg/dL — ABNORMAL HIGH (ref <117)

## 2013-03-29 MED ORDER — LORAZEPAM 2 MG/ML IJ SOLN
1.0000 mg | Freq: Once | INTRAMUSCULAR | Status: AC
  Administered 2013-03-29: 1 mg via INTRAVENOUS
  Filled 2013-03-29: qty 1

## 2013-03-29 MED ORDER — FUROSEMIDE 10 MG/ML IJ SOLN
40.0000 mg | Freq: Every day | INTRAMUSCULAR | Status: DC
  Administered 2013-03-29 – 2013-03-30 (×2): 40 mg via INTRAVENOUS
  Filled 2013-03-29 (×4): qty 4

## 2013-03-29 MED ORDER — DEXTROSE 5 % IV SOLN
1.0000 g | INTRAVENOUS | Status: DC
  Administered 2013-03-29: 1 g via INTRAVENOUS
  Filled 2013-03-29 (×2): qty 10

## 2013-03-29 MED ORDER — BOOST / RESOURCE BREEZE PO LIQD
1.0000 | Freq: Two times a day (BID) | ORAL | Status: DC
  Administered 2013-03-30 – 2013-04-02 (×5): 1 via ORAL

## 2013-03-29 NOTE — Evaluation (Signed)
Physical Therapy Evaluation Patient Details Name: Holly HickmanJanel A [name] MRN: [id number] DOB: [birth year] Today's Date: 03/29/2013 Time: [id number] PT Time Calculation (min): 14 min  PT Assessment / Plan / Recommendation History of Present Illness  80 y.o. female with multiple medical problems including type 2 diabetes mellitus, paroxysmal atrial fibrillation, CAD, CKD, type 2 diabetes mellitus(no meds listed on med rec), OSA, O2 dependent, [place], who lives at home alone brought in by family with progressive confusion over the past several months. The family confusion recently  worsened they her to her PCP who referred her to the ED today. Patient admits to occasional shortness of breath. She denies chest pain, cough, fevers, dysuria melena and no hematochezia. In the ED chest x-ray show slight interstitial prominence mild CHF not excluded CAD stent is noted, urinalysis consistent with UTI, BNP 15,398 (down from 24,000 January 2015). Her repeat potassium is still elevated at 5.5, her creatinine was 1.4 (from 1.58 on 1/18) she is admitted for further evaluation and management.  Clinical Impression  Pt admitted with UTI. Pt currently with functional limitations due to the deficits listed below (see PT Problem List).  Pt will benefit from skilled PT to increase their independence and safety with mobility to allow discharge to the venue listed below.  Pt reports receiving hospice at home and now "they won't let her do therapy."  Pt states she would like to be able to get to bathroom on her own here like she does at home however unable to state how she transfers to w/c.  Pt agreeable to more therapy and may benefit from SNF pending decisions with hospice.     PT Assessment  Patient needs continued PT services    Follow Up Recommendations  Supervision/Assistance - 24 hour;SNF    Does the patient have the potential to tolerate intense rehabilitation      Barriers to Discharge        Equipment Recommendations   None recommended by PT    Recommendations for Other Services     Frequency Min 2X/week    Precautions / Restrictions Precautions Precautions: Fall   Pertinent Vitals/Pain Remained on O2 [place]     Mobility  Bed Mobility Overal bed mobility: Needs Assistance Bed Mobility: Supine to Sit Supine to sit: Min assist;HOB elevated General bed mobility comments: assist for trunk due to no rail (states she has better rails to pull up with at home and bed rail here not as helpful) Transfers Overall transfer level:  (NT due to likely need for +2 as pt reports not ambulating in past year)    Exercises     PT Diagnosis: Generalized weakness  PT Problem List: Decreased strength;Decreased mobility;Decreased knowledge of use of DME;Decreased cognition;Obesity PT Treatment Interventions: DME instruction;Functional mobility training;Balance training;Neuromuscular re-education;Therapeutic activities;Wheelchair mobility training;Patient/family education;Therapeutic exercise     PT Goals(Current goals can be found in the care plan section) Acute Rehab PT Goals Patient Stated Goal: "I would love to be able to stand again, not necessarily walk but at least stand." PT Goal Formulation: With patient Time For Goal Achievement: 04/05/13 Potential to Achieve Goals: Good  Visit Information  Last PT Received On: 03/29/13 Assistance Needed: +2 (transfers) History of Present Illness: 80 y.o. female with multiple medical problems including type 2 diabetes mellitus, paroxysmal atrial fibrillation, CAD, CKD, type 2 diabetes mellitus(no meds listed on med rec), OSA, O2 dependent, Holly [name], who lives at home alone brought in by family with progressive confusion over the past several months. The family confusion  recently  worsened they her to her PCP who referred her to the ED today. Patient admits to occasional shortness of breath. She denies chest pain, cough, fevers, dysuria melena and no hematochezia. In the ED chest  x-ray show slight interstitial prominence mild CHF not excluded CAD stent is noted, urinalysis consistent with UTI, BNP 15,398 (down from 24,000 January 2015). Her repeat potassium is still elevated at 5.5, her creatinine was 1.4 (from 1.58 on 1/18) she is admitted for further evaluation and management.       Prior Functioning  Home Living Family/patient expects to be discharged to:: Private residence Living Arrangements: Alone Available Help at Discharge: Personal care attendant;Other (Comment) (states caregiver during day) Type of Home: House Home Equipment: Other (comment);Hospital bed;Wheelchair - manual (lift per previous admission, pt did not state this visit) Prior Function Level of Independence: Needs assistance Gait / Transfers Assistance Needed: pt states she gets to w/c however unable to explain transfer/assist/lift? Communication Communication: No difficulties    Cognition  Cognition Arousal/Alertness: Awake/alert Behavior During Therapy: WFL for tasks assessed/performed Overall Cognitive Status: History of cognitive impairments - at baseline    Extremity/Trunk Assessment Lower Extremity Assessment Lower Extremity Assessment: Generalized weakness (lower legs decreased muscle mass, dressings to heels)   Balance Balance Overall balance assessment: Needs assistance Sitting-balance support: Feet unsupported;Bilateral upper extremity supported Sitting balance-Leahy Scale: Good  End of Session PT - End of Session Equipment Utilized During Treatment: Oxygen Activity Tolerance: Patient tolerated treatment well Patient left: in bed;with call bell/phone within reach;with nursing/sitter in room (sitting EOB with RN, set up breakfast) Nurse Communication: Mobility status (did not attempt standing, w/c level at home per pt)  GP     Alissa Pharr,KATHrine E 03/29/2013, 11:06 AM Zenovia Jarred, PT, DPT 03/29/2013 Pager: [phone]

## 2013-03-29 NOTE — Care Management Note (Addendum)
    Page 1 of 1   04/02/2013     2:49:00 PM   CARE MANAGEMENT NOTE 04/02/2013  Patient:  Holly [name],Holly [name]   Account Number:  [id number]  Date Initiated:  03/29/2013  Documentation initiated by:  [facility]Quintrell Baze  Subjective/Objective Assessment:   80 Y/O F ADMITTED W/UTI.READMIT-1/16-1/19.     Action/Plan:   FROM HOME ALONE.[facility] ACTIVE.W/C BOUND.   Anticipated DC Date:  04/02/2013   Anticipated DC Plan:  SKILLED NURSING FACILITY      DC Planning Services  CM consult      Choice offered to / List presented to:             Status of service:  Completed, signed off Medicare Important Message given?   (If response is "NO", the following Medicare IM given date fields will be blank) Date Medicare IM given:   Date Additional Medicare IM given:    Discharge Disposition:  SKILLED NURSING FACILITY  Per UR Regulation:  Reviewed for med. necessity/level of care/duration of stay  If discussed at Long Length of Stay Meetings, dates discussed:    Comments:  04/02/13 Aeron Lheureux RN,BSN NCM [phone] D/C SNF.  03/30/13 Yzabella Crunk RN,BSN NCM [phone] PSYCH-NO CAPACITY FOR PARTICIPATING IN TREATMENT PLAN.D/C PLAN SNF.  03/29/13 Yasuo Phimmasone RN,BSN NCM [phone] CONFIRMED W/[facility] MOLLY SW THAT PATIENT ACTIVE W/[facility] ONLY.THER IS NO HHC INVOLVED.PSYCH EVAL.PT-24HR SUPV/SNF.OT-24HR CARE PTA,SIGNED OFF.

## 2013-03-29 NOTE — Progress Notes (Signed)
OT Cancellation Note  Patient Details Name: Holly [name] MRN: [id number] DOB: 10-19-1931   Cancelled Treatment:    Reason Eval/Treat Not Completed: OT screened, no needs identified, will sign off. Spoke with pt's daughter who states pt was total care with ADL for at least a year PTA. She has caregivers that help with her personal care. Will sign off for OT as no change in functional status.  Lennox LaityStone, Deisi Salonga [place] [phone] 03/29/2013, 11:19 AM

## 2013-03-29 NOTE — Consult Note (Addendum)
[facility] Face-to-Face Psychiatry Consult   Reason for Consult:  Confusion Referring Physician:  Dr Trinna Balloon is an 80 y.o. female. Total Time spent with patient: 30 minutes  Assessment: AXIS I:  Psychotic Disorder NOS, cognitive disorder NOS AXIS II:  Deferred AXIS III:   Past Medical History  Diagnosis Date  . Myalgia and myositis, unspecified   . Esophageal reflux   . Degeneration of intervertebral disc, site unspecified   . Morbid obesity   . Osteoarthrosis, unspecified whether generalized or localized, unspecified site   . Rheumatoid arthritis(714.0)   . Type II or unspecified type diabetes mellitus without mention of complication, not stated as uncontrolled   . CAD (coronary artery disease)   . Chronic kidney disease   . Thrombocytopenia   . Hypertension   . Hyperlipidemia   . OSA (obstructive sleep apnea)   . Interstitial lung disease   . Atrial fibrillation   . Anemia   . Vitamin D deficiency   . Cirrhosis     NASH  . Diabetes    AXIS IV:  other psychosocial or environmental problems, problems related to social environment and problems with primary support group AXIS V:  31-40 impairment in reality testing  Plan:  Patient does not meet criteria for psychiatric inpatient admission. Supportive therapy provided about ongoing stressors. Patient cannot participate in her treatment plan  Subjective:   Holly [name] is a 80 y.o. female patient admitted with confusion.  HPI:  Patient seen chart reviewed.  Patient is 80 year old Caucasian female who has multiple health issues and problem admitted on the medical floor because of confusion.  Patient lives by herself and family's concern about her progressive confusion the past several months.  Patient admitted that she has some memory impairment but she was unable to provide the details reason she was admitted to the hospital.  Patient endorsed that her family does not trust her and she should be given another chance  however she was unable to explain what circumstances brought her to the hospital.  Patient understand that family is concerned about her safety and admitted that there are episodes when she has confusion but she is unable to recall the events.  I spoke to her daughter-in-law who knows the patient has been progressively getting worse.  Her ADLs are decreased, she believed people live in a house.  She believes there is a firearm in her house and people are following her and breaking into houses.  Her daughter-in-law also endorsed that she talked to herself and recently she has taken the knives out of kitchen drawer for no reason.  There are times that she has taken double amount of her medication and burn herself.  Patient admitted that her memory has been an issue and she admitted not paying her utility bills on time but she does not believe it is an issue.  She denies that she is having any suicidal thoughts or homicidal thoughts at this time.  Patient does not appear happy with a family member but also did not provide the reason.  Her daughter-in-law endorsed that home health aid also took the notes about patient's behavior and there has multiple times that she's been confused, talking to herself, unable to sleep and having paranoia.  The patient appears very anxious, overwhelmed and tense.  She has difficulty remembering things.  She is alert and oriented x2 but she could not do serial sevens.  As per daughter-in-law patient's sister has Alzheimer's.  Patient has  no prior history of psychiatric illness or any suicidal attempts.  HPI Elements:   Location:  Decrease in ADLs, delusions, paranoia, confusion. Quality:  Significant impairment in her daily life. Severity:  Moderate.  Past Psychiatric History: Past Medical History  Diagnosis Date  . Myalgia and myositis, unspecified   . Esophageal reflux   . Degeneration of intervertebral disc, site unspecified   . Morbid obesity   . Osteoarthrosis,  unspecified whether generalized or localized, unspecified site   . Rheumatoid arthritis(714.0)   . Type II or unspecified type diabetes mellitus without mention of complication, not stated as uncontrolled   . CAD (coronary artery disease)   . Chronic kidney disease   . Thrombocytopenia   . Hypertension   . Hyperlipidemia   . OSA (obstructive sleep apnea)   . Interstitial lung disease   . Atrial fibrillation   . Anemia   . Vitamin D deficiency   . Cirrhosis     NASH  . Diabetes     reports that she quit smoking about 36 years ago. Her smoking use included Cigarettes. She has a 17 pack-year smoking history. She has never used smokeless tobacco. She reports that she does not drink alcohol or use illicit drugs. Family History  Problem Relation Age of Onset  . Rheum arthritis Mother      Living Arrangements: Alone   Abuse/Neglect [facility]) Physical Abuse: Denies Verbal Abuse: Denies Sexual Abuse: Denies Allergies:   Allergies  Allergen Reactions  . Amoxicillin-Pot Clavulanate     Diarrhea ,fatigue mouth sores   . Doxycycline Hyclate   . Iodinated Diagnostic Agents     Full body rash   . Nitrofuran Derivatives   . Norvasc [Amlodipine Besylate]     Leg swelling  . Plavix [Clopidogrel Bisulfate]     REACTION: SOB, and rash  . Sulfa Antibiotics     And adhesive tape  . Phenergan [Promethazine Hcl] Anxiety    Past Psychiatric History: Patient has no prior history of psychiatric treatment or any inpatient hospitalization.  Social history. Patient lives alone.  She has 2 children.  Her daughter-in-law fix the medications.  She has home health aid which comes from 9:00 to noon time and then 6:30 in the evening.  Objective: Blood pressure 116/72, pulse 76, temperature 98 F (36.7 C), temperature source Oral, resp. rate 18, height 5' (1.524 m), weight 181 lb 3.5 oz (82.2 kg), SpO2 99.00%.Body mass index is 35.39 kg/(m^2). Results for orders placed during the hospital encounter  of 03/28/13 (from the past 72 hour(s))  AMMONIA     Status: None   Collection Time    03/28/13  3:35 PM      Result Value Ref Range   Ammonia 34  11 - 60 umol/L  COMPREHENSIVE METABOLIC PANEL     Status: Abnormal   Collection Time    03/28/13  3:35 PM      Result Value Ref Range   Sodium 133 (*) 137 - 147 mEq/L   Potassium 5.7 (*) 3.7 - 5.3 mEq/L   Chloride 100  96 - 112 mEq/L   CO2 18 (*) 19 - 32 mEq/L   Glucose, Bld 119 (*) 70 - 99 mg/dL   BUN 66 (*) 6 - 23 mg/dL   Creatinine, Ser 3.99 (*) 0.50 - 1.10 mg/dL   Calcium 9.4  8.4 - 59.1 mg/dL   Total Protein 7.7  6.0 - 8.3 g/dL   Albumin 3.8  3.5 - 5.2 g/dL   AST 26  0 - 37 U/L   ALT 17  0 - 35 U/L   Alkaline Phosphatase 103  39 - 117 U/L   Total Bilirubin 1.3 (*) 0.3 - 1.2 mg/dL   GFR calc non Af Amer 35 (*) >90 mL/min   GFR calc Af Amer 40 (*) >90 mL/min   Comment: (NOTE)     The eGFR has been calculated using the CKD EPI equation.     This calculation has not been validated in all clinical situations.     eGFR's persistently <90 mL/min signify possible Chronic Kidney     Disease.  ETHANOL     Status: None   Collection Time    03/28/13  3:35 PM      Result Value Ref Range   Alcohol, Ethyl (B) <11  0 - 11 mg/dL   Comment:            LOWEST DETECTABLE LIMIT FOR     SERUM ALCOHOL IS 11 mg/dL     FOR MEDICAL PURPOSES ONLY  LACTIC ACID, PLASMA     Status: None   Collection Time    03/28/13  3:35 PM      Result Value Ref Range   Lactic Acid, Venous 1.0  0.5 - 2.2 mmol/L  CBC WITH DIFFERENTIAL     Status: Abnormal   Collection Time    03/28/13  3:35 PM      Result Value Ref Range   WBC 7.0  4.0 - 10.5 K/uL   RBC 4.46  3.87 - 5.11 MIL/uL   Hemoglobin 13.3  12.0 - 15.0 g/dL   HCT 40.1  36.0 - 46.0 %   MCV 89.9  78.0 - 100.0 fL   MCH 29.8  26.0 - 34.0 pg   MCHC 33.2  30.0 - 36.0 g/dL   RDW 19.3 (*) 11.5 - 15.5 %   Platelets 96 (*) 150 - 400 K/uL   Comment: REPEATED TO VERIFY     SPECIMEN CHECKED FOR CLOTS      PLATELET COUNT CONFIRMED BY SMEAR   Neutrophils Relative % 64  43 - 77 %   Lymphocytes Relative 26  12 - 46 %   Monocytes Relative 8  3 - 12 %   Eosinophils Relative 2  0 - 5 %   Basophils Relative 0  0 - 1 %   Neutro Abs 4.5  1.7 - 7.7 K/uL   Lymphs Abs 1.8  0.7 - 4.0 K/uL   Monocytes Absolute 0.6  0.1 - 1.0 K/uL   Eosinophils Absolute 0.1  0.0 - 0.7 K/uL   Basophils Absolute 0.0  0.0 - 0.1 K/uL  BLOOD GAS, ARTERIAL     Status: Abnormal   Collection Time    03/28/13  3:53 PM      Result Value Ref Range   O2 Content 3.0     Delivery systems NASAL CANNULA     pH, Arterial 7.369  7.350 - 7.450   pCO2 arterial 31.1 (*) 35.0 - 45.0 mmHg   pO2, Arterial 70.1 (*) 80.0 - 100.0 mmHg   Bicarbonate 17.5 (*) 20.0 - 24.0 mEq/L   TCO2 15.6  0 - 100 mmol/L   Acid-base deficit 6.3 (*) 0.0 - 2.0 mmol/L   O2 Saturation 92.5     Patient temperature 98.6     Collection site BRACHIAL ARTERY     Drawn by [id number]     Sample type ARTERIAL DRAW     Allens test (pass/fail) PASS  PASS  URINE RAPID DRUG SCREEN (HOSP PERFORMED)     Status: Abnormal   Collection Time    03/28/13  5:37 PM      Result Value Ref Range   Opiates POSITIVE (*) NONE DETECTED   Cocaine NONE DETECTED  NONE DETECTED   Benzodiazepines NONE DETECTED  NONE DETECTED   Amphetamines NONE DETECTED  NONE DETECTED   Tetrahydrocannabinol NONE DETECTED  NONE DETECTED   Barbiturates NONE DETECTED  NONE DETECTED   Comment:            DRUG SCREEN FOR MEDICAL PURPOSES     ONLY.  IF CONFIRMATION IS NEEDED     FOR ANY PURPOSE, NOTIFY LAB     WITHIN 5 DAYS.                LOWEST DETECTABLE LIMITS     FOR URINE DRUG SCREEN     Drug Class       Cutoff (ng/mL)     Amphetamine      1000     Barbiturate      200     Benzodiazepine   417     Tricyclics       408     Opiates          300     Cocaine          300     THC              50  URINALYSIS, ROUTINE W REFLEX MICROSCOPIC     Status: Abnormal   Collection Time    03/28/13  5:37 PM       Result Value Ref Range   Color, Urine YELLOW  YELLOW   APPearance CLOUDY (*) CLEAR   Specific Gravity, Urine 1.010  1.005 - 1.030   pH 5.0  5.0 - 8.0   Glucose, UA NEGATIVE  NEGATIVE mg/dL   Hgb urine dipstick SMALL (*) NEGATIVE   Bilirubin Urine NEGATIVE  NEGATIVE   Ketones, ur NEGATIVE  NEGATIVE mg/dL   Protein, ur NEGATIVE  NEGATIVE mg/dL   Urobilinogen, UA 1.0  0.0 - 1.0 mg/dL   Nitrite NEGATIVE  NEGATIVE   Leukocytes, UA LARGE (*) NEGATIVE  URINE MICROSCOPIC-ADD ON     Status: None   Collection Time    03/28/13  5:37 PM      Result Value Ref Range   WBC, UA 11-20  <3 WBC/hpf   RBC / HPF 0-2  <3 RBC/hpf   Bacteria, UA RARE  RARE  PRO B NATRIURETIC PEPTIDE     Status: Abnormal   Collection Time    03/28/13  6:21 PM      Result Value Ref Range   Pro B Natriuretic peptide (BNP) 15398.0 (*) 0 - 450 pg/mL  POTASSIUM     Status: Abnormal   Collection Time    03/28/13  6:21 PM      Result Value Ref Range   Potassium 5.5 (*) 3.7 - 5.3 mEq/L  GLUCOSE, CAPILLARY     Status: Abnormal   Collection Time    03/28/13 11:36 PM      Result Value Ref Range   Glucose-Capillary 171 (*) 70 - 99 mg/dL  TROPONIN I     Status: None   Collection Time    03/29/13 12:05 AM      Result Value Ref Range   Troponin I <0.30  <0.30 ng/mL   Comment:  Due to the release kinetics of cTnI,     a negative result within the first hours     of the onset of symptoms does not rule out     myocardial infarction with certainty.     If myocardial infarction is still suspected,     repeat the test at appropriate intervals.  CBC     Status: Abnormal   Collection Time    03/29/13  3:55 AM      Result Value Ref Range   WBC 8.0  4.0 - 10.5 K/uL   RBC 4.46  3.87 - 5.11 MIL/uL   Hemoglobin 13.0  12.0 - 15.0 g/dL   HCT 40.2  36.0 - 46.0 %   MCV 90.1  78.0 - 100.0 fL   MCH 29.1  26.0 - 34.0 pg   MCHC 32.3  30.0 - 36.0 g/dL   RDW 19.1 (*) 11.5 - 15.5 %   Platelets 106 (*) 150 - 400 K/uL    Comment: REPEATED TO VERIFY     CONSISTENT WITH PREVIOUS RESULT  BASIC METABOLIC PANEL     Status: Abnormal   Collection Time    03/29/13  3:55 AM      Result Value Ref Range   Sodium 134 (*) 137 - 147 mEq/L   Potassium 5.2  3.7 - 5.3 mEq/L   Chloride 99  96 - 112 mEq/L   CO2 18 (*) 19 - 32 mEq/L   Glucose, Bld 85  70 - 99 mg/dL   BUN 66 (*) 6 - 23 mg/dL   Creatinine, Ser 1.42 (*) 0.50 - 1.10 mg/dL   Calcium 9.1  8.4 - 10.5 mg/dL   GFR calc non Af Amer 34 (*) >90 mL/min   GFR calc Af Amer 40 (*) >90 mL/min   Comment: (NOTE)     The eGFR has been calculated using the CKD EPI equation.     This calculation has not been validated in all clinical situations.     eGFR's persistently <90 mL/min signify possible Chronic Kidney     Disease.  TROPONIN I     Status: None   Collection Time    03/29/13  3:55 AM      Result Value Ref Range   Troponin I <0.30  <0.30 ng/mL   Comment:            Due to the release kinetics of cTnI,     a negative result within the first hours     of the onset of symptoms does not rule out     myocardial infarction with certainty.     If myocardial infarction is still suspected,     repeat the test at appropriate intervals.  HEMOGLOBIN A1C     Status: Abnormal   Collection Time    03/29/13  3:55 AM      Result Value Ref Range   Hemoglobin A1C 6.2 (*) <5.7 %   Comment: (NOTE)                                                                               According to the ADA Clinical Practice Recommendations for 2011, when  HbA1c is used as a screening test:      >=6.5%   Diagnostic of Diabetes Mellitus               (if abnormal result is confirmed)     5.7-6.4%   Increased risk of developing Diabetes Mellitus     References:Diagnosis and Classification of Diabetes Mellitus,Diabetes     QMVH,8469,62(XBMWU 1):S62-S69 and Standards of Medical Care in             Diabetes - 2011,Diabetes XLKG,4010,27 (Suppl 1):S11-S61.   Mean Plasma Glucose 131 (*) <117  mg/dL   Comment: Performed at [facility], CAPILLARY     Status: None   Collection Time    03/29/13  7:41 AM      Result Value Ref Range   Glucose-Capillary 85  70 - 99 mg/dL  TROPONIN I     Status: None   Collection Time    03/29/13 11:25 AM      Result Value Ref Range   Troponin I <0.30  <0.30 ng/mL   Comment:            Due to the release kinetics of cTnI,     a negative result within the first hours     of the onset of symptoms does not rule out     myocardial infarction with certainty.     If myocardial infarction is still suspected,     repeat the test at appropriate intervals.  GLUCOSE, CAPILLARY     Status: Abnormal   Collection Time    03/29/13 11:42 AM      Result Value Ref Range   Glucose-Capillary 123 (*) 70 - 99 mg/dL   Labs are reviewed.  Current Facility-Administered Medications  Medication Dose Route Frequency Provider Last Rate Last Dose  . 0.9 %  sodium chloride infusion  250 mL Intravenous PRN Sheila Oats, MD      . acetaminophen (TYLENOL) tablet 650 mg  650 mg Oral Q6H PRN Adeline C Viyuoh, MD       Or  . acetaminophen (TYLENOL) suppository 650 mg  650 mg Rectal Q6H PRN Adeline C Viyuoh, MD      . ALPRAZolam Duanne Moron) tablet 0.25 mg  0.25 mg Oral QHS PRN Sheila Oats, MD   0.25 mg at 03/29/13 0021  . aspirin EC tablet 81 mg  81 mg Oral QHS Sheila Oats, MD   81 mg at 03/29/13 0021  . carvedilol (COREG) tablet 3.125 mg  3.125 mg Oral BID WC Adeline C Viyuoh, MD   3.125 mg at 03/29/13 0932  . cefTRIAXone (ROCEPHIN) 1 g in dextrose 5 % 50 mL IVPB  1 g Intravenous Q24H Adeline C Viyuoh, MD      . furosemide (LASIX) tablet 20 mg  20 mg Oral Daily Sheila Oats, MD   20 mg at 03/29/13 0932  . HYDROcodone-acetaminophen (NORCO) 10-325 MG per tablet 1 tablet  1 tablet Oral BID PRN Sheila Oats, MD   1 tablet at 03/29/13 0202  . hydroxychloroquine (PLAQUENIL) tablet 200 mg  200 mg Oral Daily Sheila Oats, MD   200 mg at 03/29/13  0932  . hydrOXYzine (ATARAX/VISTARIL) tablet 10 mg  10 mg Oral Q6H PRN Adeline C Viyuoh, MD      . insulin aspart (novoLOG) injection 0-5 Units  0-5 Units Subcutaneous QHS Adeline C Viyuoh, MD      . insulin aspart (novoLOG) injection 0-9 Units  0-9 Units Subcutaneous TID WC Sheila Oats, MD   1 Units at 03/29/13 1215  . levalbuterol (XOPENEX) nebulizer solution 0.63 mg  0.63 mg Nebulization Q4H PRN Adeline C Viyuoh, MD      . ondansetron (ZOFRAN) tablet 4 mg  4 mg Oral Q6H PRN Adeline C Viyuoh, MD       Or  . ondansetron (ZOFRAN) injection 4 mg  4 mg Intravenous Q6H PRN Adeline C Viyuoh, MD      . polyethylene glycol (MIRALAX / GLYCOLAX) packet 17 g  17 g Oral Daily Sheila Oats, MD   17 g at 03/29/13 0933  . Rivaroxaban (XARELTO) tablet 15 mg  15 mg Oral Q supper Adeline C Viyuoh, MD      . rOPINIRole (REQUIP) tablet 1 mg  1 mg Oral BID Sheila Oats, MD   1 mg at 03/29/13 0932  . sodium chloride 0.9 % injection 3 mL  3 mL Intravenous Q12H Adeline C Viyuoh, MD   3 mL at 03/29/13 1000  . sodium chloride 0.9 % injection 3 mL  3 mL Intravenous Q12H Adeline C Viyuoh, MD      . sodium chloride 0.9 % injection 3 mL  3 mL Intravenous PRN Sheila Oats, MD        Psychiatric Specialty Exam:     Blood pressure 116/72, pulse 76, temperature 98 F (36.7 C), temperature source Oral, resp. rate 18, height 5' (1.524 m), weight 181 lb 3.5 oz (82.2 kg), SpO2 99.00%.Body mass index is 35.39 kg/(m^2).  General Appearance: Confused.  Multiple bruises on her arm  Eye Contact::  Minimal  Speech:  Blocked and Slow  Volume:  Decreased  Mood:  Anxious  Affect:  Restricted  Thought Process:  Loose  Orientation:  Other:  Alert and oriented x2  Thought Content:  Delusions and Paranoid Ideation  Suicidal Thoughts:  No  Homicidal Thoughts:  No  Memory:  Immediate;   Poor Recent;   Fair Remote;   Poor  Judgement:  Impaired  Insight:  Shallow  Psychomotor Activity:  Decreased  Concentration:   Poor  Recall:  Poor  Fund of Knowledge:Poor  Language: Fair  Akathisia:  No  Handed:  Right  AIMS (if indicated):     Assets:  Communication Skills  Sleep:      Musculoskeletal: Strength & Muscle Tone: within normal limits Gait & Station: Patient is lying on the bed Patient leans: N/A  Treatment Plan Summary: The patient does not have the capacity to participate in her treatment plan. she lives by herself and possess a great danger to herself.  Patient may have underlying memory disorder.  Consider neurology consult for neuro imaging studies.  Social worker please get collateral information from the family.  Consider low-dose Risperdal 0.5 mg for agitation, psychosis , delusions and aggressive behavior.  Please call [phone] if any further question.  Phuc Kluttz T. 03/29/2013 2:59 PM

## 2013-03-29 NOTE — Progress Notes (Signed)
[facility] PROGRESS NOTE  Holly [name] [id number] DOB: 22-Aug-1931 DOA: 03/28/2013 PCP: Gaye AlkenBARNES,ELIZABETH STEWART, MD Brief HPI: Holly [name] is a 80 y.o. female with multiple medical problems including type 2 diabetes mellitus, paroxysmal atrial fibrillation, CAD, CKD, type 2 diabetes mellitus(no meds listed on med rec), OSA, O2 dependent, Elita Booneash, who lives at home alone brought in by family with progressive confusion over the past several months. The family confusion recently worsened they her to her PCP who referred her to the ED today. Patient admits to occasional shortness of breath. She denies chest pain, cough, fevers, dysuria melena and no hematochezia. In the ED chest x-ray show slight interstitial prominence mild CHF not excluded CAD stent is noted, urinalysis consistent with UTI, BNP 15,398 (down from 24,000 January 2015). Her repeat potassium is still elevated at 5.5, her creatinine was 1.4 (from 1.58 on 1/18) she is admitted for further evaluation and management  Assessment/Plan: 1. Acute encephalopathy:  - probably worsening of her underlying dementia.  - tsh and vit b12 ordered to evaluate further.  - MRI BRAIN ordered.  - psychiatry consulted and started her on risperdal.   2. Acute on chronic diastolic CHF: - bnp elevated.  - on po lasix, will change to IV lasix.  -   3. Hyperkalemia:  - resolved.  - spironolactone held.   4. UTI: - URINE CULTURES pending.  - on rocephin  5. Diabetes Mellitus; SSI.   6. Atrial fibrillation:  Resume xarelto.   DVT prophylaxis.   Code Status: full code Family Communication: discussed the plan of care with t he patient.  Disposition Plan: pending.    Consultants:  none  Procedures: none Antibiotics:  Rocephin 2/18  HPI/Subjective: Confused.  Objective: Filed Vitals:   03/29/13 1411  BP: 116/72  Pulse: 76  Temp: 98 F (36.7 C)  Resp: 18    Intake/Output Summary (Last 24 hours) at 03/29/13 1553 Last  data filed at 03/29/13 1221  Gross per 24 hour  Intake    780 ml  Output      0 ml  Net    780 ml   Filed Weights   03/28/13 2134 03/29/13 0630  Weight: 81.194 kg (179 lb) 82.2 kg (181 lb 3.5 oz)    Exam:   General:  Alert but confused  Cardiovascular: s1s2  Respiratory: ctab  Abdomen: soft NT ND BS+  Musculoskeletal: mild pedal edema.   Data Reviewed: Basic Metabolic Panel:  Recent Labs Lab 03/28/13 1535 03/28/13 1821 03/29/13 0355  NA 133*  --  134*  K 5.7* 5.5* 5.2  CL 100  --  99  CO2 18*  --  18*  GLUCOSE 119*  --  85  BUN 66*  --  66*  CREATININE 1.40*  --  1.42*  CALCIUM 9.4  --  9.1   Liver Function Tests:  Recent Labs Lab 03/28/13 1535  AST 26  ALT 17  ALKPHOS 103  BILITOT 1.3*  PROT 7.7  ALBUMIN 3.8   No results found for this basename: LIPASE, AMYLASE,  in the last 168 hours  Recent Labs Lab 03/28/13 1535  AMMONIA 34   CBC:  Recent Labs Lab 03/28/13 1535 03/29/13 0355  WBC 7.0 8.0  NEUTROABS 4.5  --   HGB 13.3 13.0  HCT 40.1 40.2  MCV 89.9 90.1  PLT 96* 106*   Cardiac Enzymes:  Recent Labs Lab 03/29/13 0005 03/29/13 0355 03/29/13 1125  TROPONINI <0.30 <0.30 <0.30   BNP (last 3  results)  Recent Labs  10/02/12 2144 02/23/13 1600 03/28/13 1821  PROBNP 3050.0* 24178.0* 15398.0*   CBG:  Recent Labs Lab 03/28/13 2336 03/29/13 0741 03/29/13 1142  GLUCAP 171* 85 123*    No results found for this or any previous visit (from the past 240 hour(s)).   Studies: Dg Chest 2 View  03/28/2013   CLINICAL DATA:  Shortness of breath.  EXAM: CHEST  2 VIEW  COMPARISON:  DG CHEST 2 VIEW dated 02/23/2013; DG CHEST 1V PORT dated 07/12/2012; DG CHEST 1V PORT dated 07/11/2012  FINDINGS: Mediastinum and hilar structures are stable. Persistent cardiomegaly and mild pulmonary vascular prominence noted. Slight interstitial prominence noted. A component of congestive heart failure cannot be excluded. Coronary artery stents noted. Poor  inspiration with basilar atelectasis and/or infiltrate. No pleural effusion or pneumothorax. Diffuse degenerative change in osteopenia thoracic spine. Degenerative changes thoracic spine and both shoulders. Thoracic spine compression fractures noted.  IMPRESSION: 1. Mild congestive heart failure cannot be excluded. Coronary artery stents noted. 2. Poor inspiration with basilar atelectasis and/or infiltrates/ edema.   Electronically Signed   By: Maisie Fus  Register   On: 03/28/2013 15:47   Ct Head Wo Contrast  03/28/2013   CLINICAL DATA:  Altered mental status  EXAM: CT HEAD WITHOUT CONTRAST  TECHNIQUE: Contiguous axial images were obtained from the base of the skull through the vertex without intravenous contrast.  COMPARISON:  CT 02/23/2013  FINDINGS: Chronic infarct right occipital parietal lobe is unchanged. Chronic microvascular ischemic change in the white matter.  Negative for acute infarct. Negative for hemorrhage or mass. Atherosclerotic calcification. No acute skull abnormality.  IMPRESSION: Chronic ischemia.  No acute abnormality.   Electronically Signed   By: Marlan Palau M.D.   On: 03/28/2013 16:20    Scheduled Meds: . aspirin EC  81 mg Oral QHS  . carvedilol  3.125 mg Oral BID WC  . cefTRIAXone (ROCEPHIN)  IV  1 g Intravenous Q24H  . furosemide  20 mg Oral Daily  . hydroxychloroquine  200 mg Oral Daily  . insulin aspart  0-5 Units Subcutaneous QHS  . insulin aspart  0-9 Units Subcutaneous TID WC  . polyethylene glycol  17 g Oral Daily  . Rivaroxaban  15 mg Oral Q supper  . rOPINIRole  1 mg Oral BID  . sodium chloride  3 mL Intravenous Q12H  . sodium chloride  3 mL Intravenous Q12H   Continuous Infusions:   Active Problems:   DIABETES MELLITUS, TYPE II   GERD   OSA (obstructive sleep apnea)   Atrial fibrillation   NASH (nonalcoholic steatohepatitis)   UTI (lower urinary tract infection)   Hyperkalemia    Time spent: 25 min    Jaydyn Bozzo  [facility] Pager  [phone]. If 7PM-7AM, please contact night-coverage at [email or web address], password [facility] 03/29/2013, 3:53 PM  LOS: 1 day

## 2013-03-29 NOTE — Progress Notes (Signed)
[facility] Sw note: Chart reviewed, met with Pt and dtr-in-law who is at Pt's bedside. PTA  Pt lived at home alone, she had paid caregivers in her home from 9am-12pm and then 5pm-8pm, otherwise Pt is at home alone. Pt requires a hoyer lift to get OOB and is either in the wheelchair or hospital bed. Pt has been more confused over the past few weeks, she has taken wrong doses of pills and even put applesauce in her pill bottles stating "I just have to clean them out." Pt's son has moved Pt's pills to the top of the refrigerator where Pt cannot reach them and fills her pillbox weekly. Pt has refused increasing care hours in the home. Sw entered Pt's room and found Pt sitting on the side of her bed eating breakfast, she denied pain. When asked if she had any complaints she stated "I need to use my briefs but they won't let me" dtr-in-law stated that Pt  usually wears briefs at home and does not like using chux pads. Pt stated that "they are going to put me somewhere". Pt stated that she still thought she was safe at home, stating "I can get up without help" but she contradicted this statement later stating "I have to use the hoyer lift to get up."  Dtr-in-law stated that the plan is for Pt to be placed in a facility, she is waiting to speak to someone from the hospital about placement for Pt. Sw discussed with Pt/dtr-in-law about Pt being placed in a long term care facility and what that will mean about Pt's continued eligibility for Hospice services, if Pt enters at SNF, she will need to revoke her Hospice Medicare Benefit to have her Medicare funds available to pay her SNF bed costs, dtr-in-law verbalized understanding of this. Sw will continue to follow for support during the hospital stay. Please keep [facility] advised of changes in Pt's care/discharge planning needs.  Luan Moore, [place], ACHP-SW [facility] # [phone]

## 2013-03-29 NOTE — Progress Notes (Signed)
INITIAL NUTRITION ASSESSMENT  DOCUMENTATION CODES Per approved criteria  -Obesity Unspecified   INTERVENTION: - Resource Breeze BID - Assisted pt with ordering dinner - Will continue to monitor   NUTRITION DIAGNOSIS: Predicted suboptimal energy intake related to poor appetite/intake prior to admission as evidenced by pt's report.   Goal: Pt to consume >90% of meals/supplements  Monitor:  Weights, labs, intake  Reason for Assessment: Malnutrition screening tool   80 y.o. female  Admitting Dx: Progressive confusion  ASSESSMENT: Pt with multiple medical problems including type 2 diabetes mellitus, paroxysmal atrial fibrillation, CAD, CKD, type 2 diabetes mellitus, OSA, O2 dependent, Nash, who lives at home alone brought in by family with progressive confusion over the past several months. The family confusion recently worsened they her to her PCP who referred her to the ED today. Patient admits to occasional shortness of breath.   Pt and daughter in law present in room. Pt reports eating 1.5 meals/day at home - typically some cereal/oatmeal for breakfast and a sandwich or 1/2 of a sandwich for lunch. Pt reports she doesn't eat more than this because she doesn't want to gain weight. Daughter-in-law reports pt has lost 100 pounds since May of last year and that she has been concerned about pt's weight loss and has been encouraging her to eat more and drink 1 Ensure/day. Pt's weight has been stable since August 2014. Noted pt with some confusion during conversation. Pt followed by hospice care PTA. Pt with good intake today, 75% of meals. Pt reports getting tired of Ensure and is willing to try Resource Breeze.   Total bilirubin elevated  BUN/Cr elevated with low GFR   Height: Ht Readings from Last 1 Encounters:  03/28/13 5' (1.524 m)    Weight: Wt Readings from Last 1 Encounters:  03/29/13 181 lb 3.5 oz (82.2 kg)    Ideal Body Weight: 100 lb   % Ideal Body Weight: 181%  Wt  Readings from Last 10 Encounters:  03/29/13 181 lb 3.5 oz (82.2 kg)  02/26/13 181 lb (82.1 kg)  10/04/12 181 lb 14.1 oz (82.5 kg)  07/17/12 242 lb (109.77 kg)  12/18/10 240 lb (108.863 kg)  11/20/08 253 lb (114.76 kg)    Usual Body Weight: 281 lb per daughter in law  % Usual Body Weight: 64%  BMI:  Body mass index is 35.39 kg/(m^2). Class II obesity  Estimated Nutritional Needs: Kcal: 1250-1450 Protein: 55-70g Fluid: 1.2-1.4L/day   Skin: Intact   Diet Order: Carb Control  EDUCATION NEEDS: -No education needs identified at this time   Intake/Output Summary (Last 24 hours) at 03/29/13 1626 Last data filed at 03/29/13 1221  Gross per 24 hour  Intake    780 ml  Output      0 ml  Net    780 ml    Last BM: PTA  Labs:   Recent Labs Lab 03/28/13 1535 03/28/13 1821 03/29/13 0355  NA 133*  --  134*  K 5.7* 5.5* 5.2  CL 100  --  99  CO2 18*  --  18*  BUN 66*  --  66*  CREATININE 1.40*  --  1.42*  CALCIUM 9.4  --  9.1  GLUCOSE 119*  --  85    CBG (last 3)   Recent Labs  03/28/13 2336 03/29/13 0741 03/29/13 1142  GLUCAP 171* 85 123*    Scheduled Meds: . aspirin EC  81 mg Oral QHS  . carvedilol  3.125 mg Oral BID WC  .  cefTRIAXone (ROCEPHIN)  IV  1 g Intravenous Q24H  . furosemide  40 mg Intravenous Daily  . hydroxychloroquine  200 mg Oral Daily  . insulin aspart  0-5 Units Subcutaneous QHS  . insulin aspart  0-9 Units Subcutaneous TID WC  . polyethylene glycol  17 g Oral Daily  . Rivaroxaban  15 mg Oral Q supper  . rOPINIRole  1 mg Oral BID  . sodium chloride  3 mL Intravenous Q12H  . sodium chloride  3 mL Intravenous Q12H    Continuous Infusions:   Past Medical History  Diagnosis Date  . Myalgia and myositis, unspecified   . Esophageal reflux   . Degeneration of intervertebral disc, site unspecified   . Morbid obesity   . Osteoarthrosis, unspecified whether generalized or localized, unspecified site   . Rheumatoid arthritis(714.0)   .  Type II or unspecified type diabetes mellitus without mention of complication, not stated as uncontrolled   . CAD (coronary artery disease)   . Chronic kidney disease   . Thrombocytopenia   . Hypertension   . Hyperlipidemia   . OSA (obstructive sleep apnea)   . Interstitial lung disease   . Atrial fibrillation   . Anemia   . Vitamin D deficiency   . Cirrhosis     NASH  . Diabetes     Past Surgical History  Procedure Laterality Date  . Appendectomy    . Rotator cuff repair    . Replacement total knee    . Tubal ligation    . Ankle fusion    . Coronary angioplasty      Levon HedgerHeather Baron MS, RD, LDN [phone] Pager [phone] After Hours Pager

## 2013-03-29 NOTE — Progress Notes (Signed)
[facility] Visit- M. Konrad DoloresLester, RN  [facility]  code status was DNR prior to admission  Pt seen lying in bed, without complaints of pain or discomfort.  d-i-l Jan present, pt alert, stated 'they just want to put me away' pt unable to talk about concerns related to her care needs at home; denies any/all issues-Jan tried to reassure pt that family has her safety and best interests and acknowledges all their choices are difficult ones; support offered.  Patient's home medication list, transfer summary and copy of DNR form is on shadow chart.  [facility] will continue to follow; please call [facility] @ [phone]- with any hospice needs.   Thank you.  Holly DavidMargie Laticha Ferrucci, RN  [facility]  Hospice Liaison  [phone])

## 2013-03-30 DIAGNOSIS — I509 Heart failure, unspecified: Secondary | ICD-10-CM

## 2013-03-30 DIAGNOSIS — E119 Type 2 diabetes mellitus without complications: Secondary | ICD-10-CM

## 2013-03-30 LAB — TSH: TSH: 1.937 u[IU]/mL (ref 0.350–4.500)

## 2013-03-30 LAB — GLUCOSE, CAPILLARY
GLUCOSE-CAPILLARY: 264 mg/dL — AB (ref 70–99)
Glucose-Capillary: 89 mg/dL (ref 70–99)
Glucose-Capillary: 120 mg/dL — ABNORMAL HIGH (ref 70–99)
Glucose-Capillary: 128 mg/dL — ABNORMAL HIGH (ref 70–99)

## 2013-03-30 LAB — VITAMIN B12: VITAMIN B 12: 913 pg/mL — AB (ref 211–911)

## 2013-03-30 MED ORDER — CIPROFLOXACIN HCL 500 MG PO TABS
500.0000 mg | ORAL_TABLET | Freq: Two times a day (BID) | ORAL | Status: DC
  Administered 2013-03-31 – 2013-04-01 (×3): 500 mg via ORAL
  Filled 2013-03-30 (×5): qty 1

## 2013-03-30 MED ORDER — DEXTROSE 5 % IV SOLN
1.0000 g | Freq: Once | INTRAVENOUS | Status: AC
  Administered 2013-03-30: 1 g via INTRAVENOUS
  Filled 2013-03-30: qty 10

## 2013-03-30 MED ORDER — FUROSEMIDE 20 MG PO TABS
20.0000 mg | ORAL_TABLET | Freq: Every day | ORAL | Status: DC
  Administered 2013-03-31 – 2013-04-02 (×3): 20 mg via ORAL
  Filled 2013-03-30 (×3): qty 1

## 2013-03-30 NOTE — Progress Notes (Signed)
Clinical Social Work Department BRIEF PSYCHOSOCIAL ASSESSMENT 03/30/2013  Patient:  Holly Holly [name],Holly Holly [name]     Account Number:  [id number]     Admit date:  03/28/2013  Clinical Social Worker:  Hattie PerchORCORAN,Kenneth Cuaresma, LCSW  Date/Time:  03/30/2013 12:00 M  Referred by:  Physician  Date Referred:  03/30/2013 Referred for  SNF Placement   Other Referral:   Interview type:  Family Other interview type:    PSYCHOSOCIAL DATA Living Status:  ALONE Admitted from facility:   Level of care:   Primary support name:  Holly Holly [name] Primary support relationship to patient:  CHILD, ADULT Degree of support available:   good    CURRENT CONCERNS Current Concerns  Post-Acute Placement   Other Concerns:    SOCIAL WORK ASSESSMENT / PLAN Patient was found incompetent to participate in treatment decisions. CSW called Patients son, Holly Holly [name]. Discussed post acute placement with him. He asked about patient not having capacity and he is worried about what that means and what his rights and responsibilities are. CSW explained that if patient lacks capacity, and he is POA, then decision default to him. He was wondering if legally something happens. CSW explained that if patient was ever to clear up for some reason, Holly [name] psychiatrist can come back and change that decision so capacity can be reversed if Holly [name] situation changes but essentially, if she lacks capacity, she cant refuse to go to snf. Son became anxious about what it means that it can be reversed. he did not appear to understand CSW's explanation. CSW attempted to reframe and and he appeared to understand. He would like patient to go to [facility] in [place]. CSW explained that due to patient having blue medicare and seemingly being at baseline as she is not able to care for herself, blue medicare may not approve patient and if they do, it may not be for long. Discussed private paying. He is prepared to private pay. He previously had 24 care for patient but she kicked every one out  and only allowed assistance six hours Holly [name] day. he wants her in Holly [name] facility as he feels that is the level of care she needs.   Assessment/plan status:   Other assessment/ plan:   Information/referral to community resources:    PATIENT'S/FAMILY'S RESPONSE TO PLAN OF CARE: son is very anxious about patient discharge plan and being able to legally do everything she needs. CSW offered emotional support.

## 2013-03-30 NOTE — Progress Notes (Signed)
Physical Therapy Treatment Patient Details Name: Holly HickmanJanel A [name] MRN: [id number] DOB: [birth year] Today's Date: 03/30/2013 Time: 1415-1500 PT Time Calculation (min): 45 min  PT Assessment / Plan / Recommendation  History of Present Illness 80 y.o. female with multiple medical problems including type 2 diabetes mellitus, paroxysmal atrial fibrillation, CAD, CKD, type 2 diabetes mellitus(no meds listed on med rec), OSA, O2 dependent, Nash, who lives at home alone brought in by family with progressive confusion over the past several months. The family confusion recently  worsened they her to her PCP who referred her to the ED today. Patient admits to occasional shortness of breath. She denies chest pain, cough, fevers, dysuria melena and no hematochezia. In the ED chest x-ray show slight interstitial prominence mild CHF not excluded CAD stent is noted, urinalysis consistent with UTI, BNP 15,398 (down from 24,000 January 2015). Her repeat potassium is still elevated at 5.5, her creatinine was 1.4 (from 1.58 on 1/18) she is admitted for further evaluation and management.   PT Comments   Pt in bed found incont urine.  Rolling side to side for hygiene and to place HOYER pad under pt to [facility] as pt stated she needed to have a BM.  Assisted with hygiene then hoyer to pt's personal wc.  Applied her diabetic shoes and performed wc mobility up/down the hallway. Daughter in law was present and was informative about pt's level of mobility. Pt states she sits in her wc about 10 hours a day and uses her "Foley" (she calls her mess sling) to be lifted in/out of bed to her wc.    Follow Up Recommendations  Supervision/Assistance - 24 hour;SNF     Does the patient have the potential to tolerate intense rehabilitation     Barriers to Discharge        Equipment Recommendations  None recommended by PT    Recommendations for Other Services    Frequency Min 2X/week   Progress towards PT Goals Progress  towards PT goals: Progressing toward goals  Plan      Precautions / Restrictions Precautions Precautions: Fall Precaution Comments: wc bound several years Restrictions Weight Bearing Restrictions: Yes     Pertinent Vitals/Pain SATURATION QUALIFICATIONS: (This note is used to comply with regulatory documentation for home oxygen)  Patient Saturations on Room Air at Rest = 88%  Patient Saturations on Room Air while performing self wc mobility = 77%  Patient Saturations on 2 Liters of oxygen while performing self wc mobility = 94%  Please briefly explain why patient needs home oxygen: Pt requires supplemental oxygen to achieve functional level     Mobility  Bed Mobility Overal bed mobility: Needs Assistance Bed Mobility: Rolling Rolling: Mod assist General bed mobility comments: side to side rolling to place HOYER pad with use of rail and pt was able to flex both knees up. Ambulation/Gait Gait velocity: HOYER lift [facility]/BM then to her personal wc Wheelchair Mobility Wheelchair mobility: Yes Wheelchair propulsion: Both lower extermities;Both upper extremities (B LE > B UE) Distance: 125 feet with increased time on 2 lts O2 avg 94% but with RA decreased to 77%. Pt uses B LE > B UE's as long as she is wearing her diabetic shoes for good traction.     PT Goals (current goals can now be found in the care plan section)    Visit Information  Last PT Received On: 03/30/13 Assistance Needed: +2 History of Present Illness: 80 y.o. female  with multiple medical problems including type 2 diabetes mellitus, paroxysmal atrial fibrillation, CAD, CKD, type 2 diabetes mellitus(no meds listed on med rec), OSA, O2 dependent, [name], who lives at home alone brought in by family with progressive confusion over the past several months. The family confusion recently  worsened they her to her PCP who referred her to the ED today. Patient admits to occasional shortness of breath. She  denies chest pain, cough, fevers, dysuria melena and no hematochezia. In the ED chest x-ray show slight interstitial prominence mild CHF not excluded CAD stent is noted, urinalysis consistent with UTI, BNP 15,398 (down from 24,000 January 2015). Her repeat potassium is still elevated at 5.5, her creatinine was 1.4 (from 1.58 on 1/18) she is admitted for further evaluation and management.    Subjective Data      Cognition       Balance     End of Session PT - End of Session Equipment Utilized During Treatment: Oxygen Activity Tolerance: Patient tolerated treatment well Patient left: in chair;with call bell/phone within reach;with family/visitor present   Felecia Shelling  PTA [facility]  Acute  Rehab Pager      [phone]

## 2013-03-30 NOTE — Clinical Documentation Improvement (Signed)
  To Hospitalist MD's, NP's, and PA's  Per chart review noted "O2 dependent"  being documented in paragraph listing medical problems but no corresponding diagnosis. If following diagnosis is the reason for oxygen usage please document in notes.  Thank you     Medicare rules require specification as to whether an inpatient diagnosis was present at the time of admission.    Please clarify if the following diagnosis Post inflammatory Pulmonary fibrosis was:       Present at the time of admission   NOT present at the time of inpatient admission and it developed during the inpatient stay   Unable to clinically determine whether the condition was present on admission.   Documentation insufficient to determine if condition was present at the time of inpatient admission  Thank You,  Lavonda JumboLawanda J Bettina Warn ,RN Clinical Documentation Specialist:  [phone]  [facility]- Health Information Management

## 2013-03-30 NOTE — Progress Notes (Signed)
[facility] PROGRESS NOTE  JADEN BATCHELDER ONG:[id number] DOB: [birth year] DOA: 03/28/2013 PCP: [name], MD Brief HPI: Holly [name] is a 80 y.o. female with multiple medical problems including type 2 diabetes mellitus, paroxysmal atrial fibrillation, CAD, CKD, type 2 diabetes mellitus(no meds listed on med rec), OSA, O2 dependent, [name], who lives at home alone brought in by family with progressive confusion over the past several months. The family confusion recently worsened they her to her PCP who referred her to the ED today. Patient admits to occasional shortness of breath. She denies chest pain, cough, fevers, dysuria melena and no hematochezia. In the ED chest x-ray show slight interstitial prominence mild CHF not excluded CAD stent is noted, urinalysis consistent with UTI, BNP 15,398 (down from 24,000 January 2015). Her repeat potassium is still elevated at 5.5, her creatinine was 1.4 (from 1.58 on 1/18) she is admitted for further evaluation and management  Assessment/Plan: 1. Acute encephalopathy:  - probably worsening of her underlying dementia.  - tsh and vit b12 ordered and they are within normal limits.  - MRI BRAIN ordered and negative for acute stroke.  - psychiatry consulted and started her on risperdal.   2. Acute on chronic diastolic CHF: - bnp elevated.  - diuresed appropriately , will change to po lasix.    3. Hyperkalemia:  - resolved.  - spironolactone held.   4. UTI: - URINE CULTURES show pseudomonas. - on rocephin for 2 days and will change to ciprofloxacin.   5. Diabetes Mellitus; SSI.   6. Atrial fibrillation:  Resume xarelto.   DVT prophylaxis.   Code Status: full code Family Communication: discussed the plan of care with t he patient.  Disposition Plan: snf.   Consultants:  none  Procedures: none Antibiotics:  Rocephin 2/18 - 2/20  Ciprofloxacin 2/21  HPI/Subjective: Confused. Comfortable.  Objective: Filed Vitals:    03/30/13 1711  BP: 118/58  Pulse: 68  Temp:   Resp:     Intake/Output Summary (Last 24 hours) at 03/30/13 1856 Last data filed at 03/30/13 1815  Gross per 24 hour  Intake   1250 ml  Output      0 ml  Net   1250 ml   Filed Weights   03/28/13 2134 03/29/13 0630 03/30/13 0507  Weight: 81.194 kg (179 lb) 82.2 kg (181 lb 3.5 oz) 82.4 kg (181 lb 10.5 oz)    Exam:   General:  Alert but confused  Cardiovascular: s1s2  Respiratory: ctab  Abdomen: soft NT ND BS+  Musculoskeletal: mild pedal edema.   Data Reviewed: Basic Metabolic Panel:  Recent Labs Lab 03/28/13 1535 03/28/13 1821 03/29/13 0355  NA 133*  --  134*  K 5.7* 5.5* 5.2  CL 100  --  99  CO2 18*  --  18*  GLUCOSE 119*  --  85  BUN 66*  --  66*  CREATININE 1.40*  --  1.42*  CALCIUM 9.4  --  9.1   Liver Function Tests:  Recent Labs Lab 03/28/13 1535  AST 26  ALT 17  ALKPHOS 103  BILITOT 1.3*  PROT 7.7  ALBUMIN 3.8   No results found for this basename: LIPASE, AMYLASE,  in the last 168 hours  Recent Labs Lab 03/28/13 1535  AMMONIA 34   CBC:  Recent Labs Lab 03/28/13 1535 03/29/13 0355  WBC 7.0 8.0  NEUTROABS 4.5  --   HGB 13.3 13.0  HCT 40.1 40.2  MCV 89.9 90.1  PLT 96* 106*  Cardiac Enzymes:  Recent Labs Lab 03/29/13 0005 03/29/13 0355 03/29/13 1125  TROPONINI <0.30 <0.30 <0.30   BNP (last 3 results)  Recent Labs  10/02/12 2144 02/23/13 1600 03/28/13 1821  PROBNP 3050.0* 24178.0* 15398.0*   CBG:  Recent Labs Lab 03/29/13 1805 03/29/13 2031 03/30/13 0731 03/30/13 1203 03/30/13 1634  GLUCAP 125* 117* 89 120* 264*    Recent Results (from the past 240 hour(s))  URINE CULTURE     Status: None   Collection Time    03/28/13  5:37 PM      Result Value Ref Range Status   Specimen Description URINE, CATHETERIZED   Final   Special Requests NONE   Final   Culture  Setup Time     Final   Value: 03/28/2013 22:45     Performed at [facility]  Count     Final   Value: >=100,000 COLONIES/ML     Performed at [facility]   Culture     Final   Value: PSEUDOMONAS AERUGINOSA     Performed at [facility]   Report Status PENDING   Incomplete   Organism ID, Bacteria PSEUDOMONAS AERUGINOSA   Final     Studies: Mr Brain Wo Contrast  03/29/2013   CLINICAL DATA:  Altered mental status.  EXAM: MRI HEAD WITHOUT CONTRAST  TECHNIQUE: Multiplanar, multiecho pulse sequences of the brain and surrounding structures were obtained without intravenous contrast.  COMPARISON:  CT HEAD W/O CM dated 03/28/2013  FINDINGS: Images are moderately degraded by motion artifact despite the patient receiving medication for anxiolysis and sequences being repeated.  Incidental note is made of a partially empty sella. There is no evidence of acute infarct. Encephalomalacia is noted related to remote right occipital lobe infarct. Patchy T2 hyperintensities in the subcortical and deep cerebral white matter are nonspecific but compatible with mild to moderate chronic small vessel ischemic disease. There is no evidence of mass, midline shift, intracranial hemorrhage, or extra-axial fluid collection. There is moderate cerebral atrophy.  Prior left cataract surgery is noted. There may be a small amount of mastoid fluid bilaterally. Major intracranial vascular flow voids are grossly preserved. Paranasal sinuses are clear.  IMPRESSION: 1. Moderately motion degraded examination without evidence of acute infarct or other acute intracranial abnormality. 2. Remote right occipital infarct and mild-to-moderate chronic small vessel ischemic disease.   Electronically Signed   By: Sebastian Ache   On: 03/29/2013 18:21    Scheduled Meds: . aspirin EC  81 mg Oral QHS  . carvedilol  3.125 mg Oral BID WC  . cefTRIAXone (ROCEPHIN)  IV  1 g Intravenous Q24H  . feeding supplement (RESOURCE BREEZE)  1 Container Oral BID BM  . furosemide  40 mg Intravenous Daily  . hydroxychloroquine   200 mg Oral Daily  . insulin aspart  0-5 Units Subcutaneous QHS  . insulin aspart  0-9 Units Subcutaneous TID WC  . polyethylene glycol  17 g Oral Daily  . Rivaroxaban  15 mg Oral Q supper  . rOPINIRole  1 mg Oral BID  . sodium chloride  3 mL Intravenous Q12H  . sodium chloride  3 mL Intravenous Q12H   Continuous Infusions:   Active Problems:   DIABETES MELLITUS, TYPE II   GERD   OSA (obstructive sleep apnea)   Atrial fibrillation   NASH (nonalcoholic steatohepatitis)   UTI (lower urinary tract infection)   Hyperkalemia    Time spent: 25 min    Traver Meckes  [facility] Pager [phone]. If 7PM-7AM, please contact night-coverage at [email or web address], password [email or web address] 03/30/2013, 6:56 PM  LOS: 2 days

## 2013-03-30 NOTE — Progress Notes (Signed)
[facility] Sw note:  Sw reviewed chart. Pt in bed watching TV, no family at bedside. Pt stated that she was good, she stated that her heels hurt. Pt was confused during the visit, she stated that her son and dtr-in-law were outside "talking about the weather" and stated "I have been in this place for over a week now." Pt could remember she had eaten breakfast but could not remember what she had to eat. Pt was unable to remember the name of where she was and could not remember how long she had been here. Pt stated that she was not sure how long she was going to be here "I think they will let me go today."  Noted plans are for Pt to be placed in a long term care facility, as she is unsafe to return home and needs a higher level of care at this time. Sw will continue to follow for support during the hospital stay.  Lorain ChildesMolly Lyle, [place], ACHP-SW [facility] # [phone]

## 2013-03-30 NOTE — Progress Notes (Signed)
Clinical Social Work Department CLINICAL SOCIAL WORK PSYCHIATRY SERVICE LINE ASSESSMENT 03/30/2013  Patient:  Holly [name]  Account:  [id number]  Jamul Date:  03/28/2013  Clinical Social Worker:  Sindy Messing, LCSW  Date/Time:  03/30/2013 03:30 PM Referred by:  Physician  Date referred:  03/30/2013 Reason for Referral  Psychosocial assessment   Presenting Symptoms/Problems (In the person's/family's own words):   Psych consulted for hallucinations and confusion.   Abuse/Neglect/Trauma History (check all that apply)  Denies history   Abuse/Neglect/Trauma Comments:   Psychiatric History (check all that apply)  Denies history   Psychiatric medications:  Xanax 0.25 mg   Current Mental Health Hospitalizations/Previous Mental Health History:   Patient has no prior history of any MH diagnosis. Patient started hallucinating shortly after getting on hospice services in May 2014. Patient's delusions and paranoid has increased over the past 2 months.   Current provider:   None   Place and Date:   N/A   Current Medications:   Scheduled Meds:      . aspirin EC  81 mg Oral QHS  . carvedilol  3.125 mg Oral BID WC  . cefTRIAXone (ROCEPHIN)  IV  1 g Intravenous Q24H  . feeding supplement (RESOURCE BREEZE)  1 Container Oral BID BM  . furosemide  40 mg Intravenous Daily  . hydroxychloroquine  200 mg Oral Daily  . insulin aspart  0-5 Units Subcutaneous QHS  . insulin aspart  0-9 Units Subcutaneous TID WC  . polyethylene glycol  17 g Oral Daily  . Rivaroxaban  15 mg Oral Q supper  . rOPINIRole  1 mg Oral BID  . sodium chloride  3 mL Intravenous Q12H  . sodium chloride  3 mL Intravenous Q12H        Continuous Infusions:      PRN Meds:.sodium chloride, acetaminophen, acetaminophen, ALPRAZolam, HYDROcodone-acetaminophen, hydrOXYzine, levalbuterol, ondansetron (ZOFRAN) IV, ondansetron, sodium chloride       Previous Impatient Admission/Date/Reason:   None reported   Emotional Health /  Current Symptoms    Suicide/Self Harm  None reported   Suicide attempt in the past:   Patient denies any SI or HI. Per family, no attempts in the past.   Other harmful behavior:   None reported   Psychotic/Dissociative Symptoms  Auditory Hallucinations  Visual Hallucinations  Paranoia  Delusional   Other Psychotic/Dissociative Symptoms:   Patient started seeing people that were not in the room and talking to people who were not present. Patient would become confused and would get paranoid that aides were stealing her money.    Attention/Behavioral Symptoms  Withdrawn   Other Attention / Behavioral Symptoms:   Patient guarded and did not want to speak much during assessment.    Cognitive Impairment  Within Normal Limits   Other Cognitive Impairment:   Patient alert and oriented during assessment.    Mood and Adjustment  Flat    Stress, Anxiety, Trauma, Any Recent Loss/Stressor  None reported   Anxiety (frequency):   N/A   Phobia (specify):   N/A   Compulsive behavior (specify):   N/A   Obsessive behavior (specify):   N/A   Other:   N/A   Substance Abuse/Use  None   SBIRT completed (please refer for detailed history):  N  Self-reported substance use:   Patient denies any substance use.   Urinary Drug Screen Completed:  Y Alcohol level:   <11    Environmental/Housing/Living Arrangement  Stable housing   Who is in the home:  Alone   Emergency contact:  [place]  Medicare   Patient's Strengths and Goals (patient's own words):   Patient has supportive family.   Clinical Social Worker's Interpretive Summary:   CSW received referral to complete psychosocial assessment and to follow up after psych MD visit. Psych MD reports patient does not require inpatient psych hospital setting but should be placed at SNF and made medication recommendations.    CSW met with patient and dtr-in-law at bedside. CSW introduced myself and  explained role. Patient worked with therapy off and on during the beginning of the session while CSW spoke with dtr-in-law. Patient is not happy about decision to be placed in SNF. Patient has accused family of "locking me up so you can steal my money." Family reports that patient was doing well with no MH concerns until May 2014. Family states that patient first began hallucinating and talking to herself. Patient has become more aggressive and confused in the past two months. Family gives examples of patient not allowing 24 hour care because she is worried the aides are going to steal her money, calling the dtr-in-law and crying because she thinks her son has died in a car accident, and being aggressive towards family and caregivers. Family is in agreement with SNF and working with unit CSW on placement.    Family reports they are concerned if psychosis is related to [place] concerns vs medical concerns. Family is aware that patient was prescribed medication. Dtr-in-law reports that her brother has schizophrenia and she is aware that medication takes a few weeks to work effectively. Family believes that medication is a good start and agreeable that outpatient follow up is needed. CSW provided referrals for geriatric psychiatrist. Family is agreeable to transport patient to and from psych appointments to ensure symptoms are being managed.    Patient is guarded and easily irritated when asked questions. Patient refuses to answer some questions but does allow family to talk about her behaviors. Patient said she would be agreeable to outpatient follow up and family reports that patient is insistent about attending scheduled appointments.    CSW will continue to follow during hospitalization.   Disposition:  Outpatient referral made/needed   [place], [place] [phone]

## 2013-03-30 NOTE — Progress Notes (Signed)
CSW called [facility] [place]. They will review patient and get back to CSW.  Latara Micheli C. Revecca Nachtigal MSW, LCSW [phone]

## 2013-03-30 NOTE — Progress Notes (Signed)
[facility] RN Visit- M. Konrad DoloresLester, RN  Late entry Related admission, [facility] with diagnosis of IPLD code status was DNR prior to admission  Pt seen, lying in bed, without complaints of pain or discomfort;  pt stated she has 'been talking about what to do next- my son and daughter in law are here somewhere' No family at bedside; Patient's home medication list, transfer summary and copy of DNR form is on shadow chart.  Noted plans are for long term care placement; hospital SW involved with disposition planning Please contact [facility] @ [phone] with any hospice needs  Valente DavidMargie Emberlee Sortino, RN 03/30/2013, 4:22 PM [facility] RN Liaison [phone]

## 2013-03-31 LAB — GLUCOSE, CAPILLARY
Glucose-Capillary: 79 mg/dL (ref 70–99)
Glucose-Capillary: 144 mg/dL — ABNORMAL HIGH (ref 70–99)
Glucose-Capillary: 132 mg/dL — ABNORMAL HIGH (ref 70–99)
Glucose-Capillary: 132 mg/dL — ABNORMAL HIGH (ref 70–99)

## 2013-03-31 LAB — BASIC METABOLIC PANEL
BUN: 66 mg/dL — ABNORMAL HIGH (ref 6–23)
CALCIUM: 9 mg/dL (ref 8.4–10.5)
CO2: 19 meq/L (ref 19–32)
Chloride: 96 mEq/L (ref 96–112)
Creatinine, Ser: 1.34 mg/dL — ABNORMAL HIGH (ref 0.50–1.10)
GFR calc Af Amer: 42 mL/min — ABNORMAL LOW (ref >90)
GFR calc non Af Amer: 37 mL/min — ABNORMAL LOW (ref >90)
Glucose, Bld: 89 mg/dL (ref 70–99)
POTASSIUM: 4.9 meq/L (ref 3.7–5.3)
SODIUM: 133 meq/L — AB (ref 137–147)

## 2013-03-31 LAB — CBC
HCT: 40.7 % (ref 36.0–46.0)
Hemoglobin: 13.4 g/dL (ref 12.0–15.0)
MCH: 29.5 pg (ref 26.0–34.0)
MCHC: 32.9 g/dL (ref 30.0–36.0)
MCV: 89.5 fL (ref 78.0–100.0)
Platelets: 107 10*3/uL — ABNORMAL LOW (ref 150–400)
RBC: 4.55 MIL/uL (ref 3.87–5.11)
RDW: 19 % — ABNORMAL HIGH (ref 11.5–15.5)
WBC: 7 10*3/uL (ref 4.0–10.5)

## 2013-03-31 LAB — URINE CULTURE

## 2013-03-31 LAB — PRO B NATRIURETIC PEPTIDE: Pro B Natriuretic peptide (BNP): 13717 pg/mL — ABNORMAL HIGH (ref 0–450)

## 2013-03-31 MED ORDER — VITAMINS A & D EX OINT
TOPICAL_OINTMENT | CUTANEOUS | Status: AC
  Administered 2013-03-31: 09:00:00
  Filled 2013-03-31: qty 5

## 2013-03-31 NOTE — Progress Notes (Signed)
DNR.  Related admission.  Patient in the bed with family present.  Eating lunch and conversing.  No issues with pain reported.  Patient desires to return home and is questioning if hospice will continue if she returns home.  Advised that primary hospice staff would see her if she returned home.  Patient did not offer any information about discussion of SNF.  Chart reviewed and spoke with Kendal HymenBonnie, RN for patient.  Please contact [facility] [phone] with any questions or concerns.  Willette PaJulie Brown, RN [facility]

## 2013-03-31 NOTE — Progress Notes (Signed)
[facility] PROGRESS NOTE  Holly [name] WUJ:[id number] DOB: 04/29/1931 DOA: 03/28/2013 PCP: [name], MD Brief HPI: Holly [name] is a 80 y.o. female with multiple medical problems including type 2 diabetes mellitus, paroxysmal atrial fibrillation, CAD, CKD, type 2 diabetes mellitus(no meds listed on med rec), OSA, O2 dependent, [name], who lives at home alone brought in by family with progressive confusion over the past several months. The family confusion recently worsened they her to her PCP who referred her to the ED today. Patient admits to occasional shortness of breath. She denies chest pain, cough, fevers, dysuria melena and no hematochezia. In the ED chest x-ray show slight interstitial prominence mild CHF not excluded CAD stent is noted, urinalysis consistent with UTI, BNP 15,398 (down from 24,000 January 2015). Her repeat potassium is still elevated at 5.5, her creatinine was 1.4 (from 1.58 on 1/18) she is admitted for further evaluation and management  Assessment/Plan: 1. Acute encephalopathy:  - probably worsening of her underlying dementia.  - tsh and vit b12 ordered and they are within normal limits.  - MRI BRAIN ordered and negative for acute stroke.  - psychiatry consulted and started her on risperdal.   2. Acute on chronic diastolic CHF: - bnp elevated.  - diuresed appropriately , will change to po lasix.    3. Hyperkalemia:  - resolved.  - spironolactone held.   4. UTI: - URINE CULTURES show pseudomonas. - on rocephin for 2 days and will change to ciprofloxacin.   5. Diabetes Mellitus; SSI.   6. Atrial fibrillation:  Resume xarelto.   DVT prophylaxis.   Code Status: full code Family Communication: discussed the plan of care with t he patient.  Disposition Plan: snf.   Consultants:  none  Procedures: none Antibiotics:  Rocephin 2/18 - 2/20  Ciprofloxacin 2/21  HPI/Subjective: Confused. Comfortable.  Objective: Filed Vitals:    03/31/13 1459  BP: 106/48  Pulse: 59  Temp: 97.3 F (36.3 C)  Resp: 16    Intake/Output Summary (Last 24 hours) at 03/31/13 1516 Last data filed at 03/31/13 1459  Gross per 24 hour  Intake    840 ml  Output      0 ml  Net    840 ml   Filed Weights   03/29/13 0630 03/30/13 0507 03/31/13 0502  Weight: 82.2 kg (181 lb 3.5 oz) 82.4 kg (181 lb 10.5 oz) 77.7 kg (171 lb 4.8 oz)    Exam:   General:  Alert but confused  Cardiovascular: s1s2  Respiratory: ctab  Abdomen: soft NT ND BS+  Musculoskeletal: mild pedal edema.   Data Reviewed: Basic Metabolic Panel:  Recent Labs Lab 03/28/13 1535 03/28/13 1821 03/29/13 0355 03/31/13 0427  NA 133*  --  134* 133*  K 5.7* 5.5* 5.2 4.9  CL 100  --  99 96  CO2 18*  --  18* 19  GLUCOSE 119*  --  85 89  BUN 66*  --  66* 66*  CREATININE 1.40*  --  1.42* 1.34*  CALCIUM 9.4  --  9.1 9.0   Liver Function Tests:  Recent Labs Lab 03/28/13 1535  AST 26  ALT 17  ALKPHOS 103  BILITOT 1.3*  PROT 7.7  ALBUMIN 3.8   No results found for this basename: LIPASE, AMYLASE,  in the last 168 hours  Recent Labs Lab 03/28/13 1535  AMMONIA 34   CBC:  Recent Labs Lab 03/28/13 1535 03/29/13 0355 03/31/13 0427  WBC 7.0 8.0 7.0  NEUTROABS  4.5  --   --   HGB 13.3 13.0 13.4  HCT 40.1 40.2 40.7  MCV 89.9 90.1 89.5  PLT 96* 106* 107*   Cardiac Enzymes:  Recent Labs Lab 03/29/13 0005 03/29/13 0355 03/29/13 1125  TROPONINI <0.30 <0.30 <0.30   BNP (last 3 results)  Recent Labs  02/23/13 1600 03/28/13 1821 03/31/13 0427  PROBNP 24178.0* 15398.0* 13717.0*   CBG:  Recent Labs Lab 03/30/13 1203 03/30/13 1634 03/30/13 2133 03/31/13 0747 03/31/13 1156  GLUCAP 120* 264* 128* 79 144*    Recent Results (from the past 240 hour(s))  URINE CULTURE     Status: None   Collection Time    03/28/13  5:37 PM      Result Value Ref Range Status   Specimen Description URINE, CATHETERIZED   Final   Special Requests NONE    Final   Culture  Setup Time     Final   Value: 03/28/2013 22:45     Performed at [facility] Count     Final   Value: >=100,000 COLONIES/ML     Performed at [facility]   Culture     Final   Value: PSEUDOMONAS AERUGINOSA     Performed at [facility]   Report Status PENDING   Incomplete   Organism ID, Bacteria PSEUDOMONAS AERUGINOSA   Final     Studies: Mr Brain Wo Contrast  03/29/2013   CLINICAL DATA:  Altered mental status.  EXAM: MRI HEAD WITHOUT CONTRAST  TECHNIQUE: Multiplanar, multiecho pulse sequences of the brain and surrounding structures were obtained without intravenous contrast.  COMPARISON:  CT HEAD W/O CM dated 03/28/2013  FINDINGS: Images are moderately degraded by motion artifact despite the patient receiving medication for anxiolysis and sequences being repeated.  Incidental note is made of a partially empty sella. There is no evidence of acute infarct. Encephalomalacia is noted related to remote right occipital lobe infarct. Patchy T2 hyperintensities in the subcortical and deep cerebral white matter are nonspecific but compatible with mild to moderate chronic small vessel ischemic disease. There is no evidence of mass, midline shift, intracranial hemorrhage, or extra-axial fluid collection. There is moderate cerebral atrophy.  Prior left cataract surgery is noted. There may be a small amount of mastoid fluid bilaterally. Major intracranial vascular flow voids are grossly preserved. Paranasal sinuses are clear.  IMPRESSION: 1. Moderately motion degraded examination without evidence of acute infarct or other acute intracranial abnormality. 2. Remote right occipital infarct and mild-to-moderate chronic small vessel ischemic disease.   Electronically Signed   By: Sebastian Ache   On: 03/29/2013 18:21    Scheduled Meds: . aspirin EC  81 mg Oral QHS  . carvedilol  3.125 mg Oral BID WC  . ciprofloxacin  500 mg Oral BID  . feeding supplement (RESOURCE  BREEZE)  1 Container Oral BID BM  . furosemide  20 mg Oral Daily  . hydroxychloroquine  200 mg Oral Daily  . insulin aspart  0-5 Units Subcutaneous QHS  . insulin aspart  0-9 Units Subcutaneous TID WC  . polyethylene glycol  17 g Oral Daily  . Rivaroxaban  15 mg Oral Q supper  . rOPINIRole  1 mg Oral BID  . sodium chloride  3 mL Intravenous Q12H  . sodium chloride  3 mL Intravenous Q12H   Continuous Infusions:   Active Problems:   DIABETES MELLITUS, TYPE II   GERD   OSA (obstructive sleep apnea)   Atrial fibrillation  NASH (nonalcoholic steatohepatitis)   UTI (lower urinary tract infection)   Hyperkalemia    Time spent: 25 min    Tenoch Mcclure  [facility] Pager [phone]. If 7PM-7AM, please contact night-coverage at [email or web address], password [email or web address] 03/31/2013, 3:16 PM  LOS: 3 days

## 2013-04-01 DIAGNOSIS — B952 Enterococcus as the cause of diseases classified elsewhere: Secondary | ICD-10-CM | POA: Diagnosis present

## 2013-04-01 DIAGNOSIS — N390 Urinary tract infection, site not specified: Secondary | ICD-10-CM

## 2013-04-01 DIAGNOSIS — K7460 Unspecified cirrhosis of liver: Secondary | ICD-10-CM

## 2013-04-01 LAB — GLUCOSE, CAPILLARY
GLUCOSE-CAPILLARY: 168 mg/dL — AB (ref 70–99)
GLUCOSE-CAPILLARY: 90 mg/dL (ref 70–99)
Glucose-Capillary: 95 mg/dL (ref 70–99)
Glucose-Capillary: 120 mg/dL — ABNORMAL HIGH (ref 70–99)

## 2013-04-01 MED ORDER — LEVOFLOXACIN 750 MG PO TABS
750.0000 mg | ORAL_TABLET | ORAL | Status: DC
  Administered 2013-04-01: 750 mg via ORAL
  Filled 2013-04-01: qty 1

## 2013-04-01 NOTE — Consult Note (Signed)
Consultation  Referring Provider:  [facility]    Primary Care Physician:  [name], MD Primary Gastroenterologist:  Lina Sar, MD     Reason for Consultation:   confusion          HPI:   Holly [name] is a 80 y.o. female multiple, significant medical problems. She is on multiple medications including chronic xarelto. We saw the patient in the hospital in June 2014 for coffee ground emesis. CT scan revealed cirrhosis and large volume ascites. On her exam her stool was light brown, hemoglobin was stable. EGD was not pursued given acute medical issues. Bleeding was felt to be low volume, possibly related to erosive disease. During that admission patient had a large volume paracentesis. Her SAAG was c/w portal HTN. Hepatitis B and C serologies negative.  I saw the patient November 2014 in the office. She came in just to find out status of liver disease. At that time she was doing reasonably well. She is scheduled for office follow up this week. Patient sent by PCP to ED a few days ago for altered mental status and hypoxia. Labs revealed minor electrolyte imbalances. CBC was stable. She does have a UTI. CXR raises suspicion for heart failure and BNP markedly elevated. Patient tells me she is compliant with low sodium diet and diuretics.   Patient has no complaints, she wants to go home   Past Medical History  Diagnosis Date  . Myalgia and myositis, unspecified   . Esophageal reflux   . Degeneration of intervertebral disc, site unspecified   . Morbid obesity   . Osteoarthrosis, unspecified whether generalized or localized, unspecified site   . Rheumatoid arthritis(714.0)   . Type II or unspecified type diabetes mellitus without mention of complication, not stated as uncontrolled   . CAD (coronary artery disease)   . Chronic kidney disease   . Hypertension   . Hyperlipidemia   . OSA (obstructive sleep apnea)   . Interstitial lung disease   . Atrial fibrillation    . Vitamin D deficiency   . Cirrhosis     NASH  . Diabetes     Past Surgical History  Procedure Laterality Date  . Appendectomy    . Rotator cuff repair    . Replacement total knee    . Tubal ligation    . Ankle fusion    . Coronary angioplasty      Family History  Problem Relation Age of Onset  . Rheum arthritis Mother      History  Substance Use Topics  . Smoking status: Former Smoker -- 1.00 packs/day for 17 years    Types: Cigarettes    Quit date: 02/08/1977  . Smokeless tobacco: Never Used  . Alcohol Use: No    Prior to Admission medications   Medication Sig Start Date End Date Taking? Authorizing Provider  ALPRAZolam (XANAX) 0.25 MG tablet Take 1 tablet (0.25 mg total) by mouth at bedtime as needed for anxiety or sleep. 07/17/12  Yes Laveda Norman, MD  aspirin EC 81 MG tablet Take 81 mg by mouth at bedtime.    Yes Historical Provider, MD  carvedilol (COREG) 3.125 MG tablet Take 1 tablet (3.125 mg total) by mouth 2 (two) times daily with a meal. 02/26/13  Yes Costin Otelia Sergeant, MD  furosemide (LASIX) 20 MG tablet Take 1 tablet (20 mg total) by mouth daily. 10/04/12  Yes Henderson Cloud, MD  HYDROcodone-acetaminophen (LORTAB) 10-500 MG per tablet  Take 1 tablet by mouth 2 (two) times daily as needed for pain.    Yes Historical Provider, MD  hydroxychloroquine (PLAQUENIL) 200 MG tablet Take 200 mg by mouth daily.    Yes Historical Provider, MD  hydrOXYzine (ATARAX/VISTARIL) 10 MG tablet Take 1 tablet (10 mg total) by mouth every 6 (six) hours as needed for itching. 07/17/12  Yes Laveda Normanhris N Oti, MD  levalbuterol Pauline Aus(XOPENEX) 0.63 MG/3ML nebulizer solution Take 3 mLs (0.63 mg total) by nebulization every 4 (four) hours as needed for wheezing or shortness of breath. 07/17/12  Yes Laveda Normanhris N Oti, MD  polyethylene glycol [facility](MIRALAX / GLYCOLAX) packet Take 17 g by mouth daily. 07/17/12  Yes Laveda Normanhris N Oti, MD  Rivaroxaban (XARELTO) 15 MG TABS tablet Take 1 tablet (15 mg total) by mouth daily  with supper. 02/26/13  Yes Costin Otelia SergeantM Gherghe, MD  rOPINIRole (REQUIP) 0.5 MG tablet Take 1 mg by mouth 2 (two) times daily.    Yes Historical Provider, MD  spironolactone (ALDACTONE) 50 MG tablet Take 1 tablet (50 mg total) by mouth daily. 10/04/12  Yes Estela Isaiah BlakesY Hernandez Acosta, MD    Current Facility-Administered Medications  Medication Dose Route Frequency Provider Last Rate Last Dose  . 0.9 %  sodium chloride infusion  250 mL Intravenous PRN Kela MillinAdeline C Viyuoh, MD      . acetaminophen (TYLENOL) tablet 650 mg  650 mg Oral Q6H PRN Adeline C Viyuoh, MD       Or  . acetaminophen (TYLENOL) suppository 650 mg  650 mg Rectal Q6H PRN Adeline C Viyuoh, MD      . ALPRAZolam Prudy Feeler(XANAX) tablet 0.25 mg  0.25 mg Oral QHS PRN Kela MillinAdeline C Viyuoh, MD   0.25 mg at 03/29/13 1948  . aspirin EC tablet 81 mg  81 mg Oral QHS Kela MillinAdeline C Viyuoh, MD   81 mg at 03/31/13 2212  . carvedilol (COREG) tablet 3.125 mg  3.125 mg Oral BID WC Adeline C Viyuoh, MD   3.125 mg at 04/01/13 0747  . ciprofloxacin (CIPRO) tablet 500 mg  500 mg Oral BID Kathlen ModyVijaya Akula, MD   500 mg at 04/01/13 0746  . feeding supplement (RESOURCE BREEZE) (RESOURCE BREEZE) liquid 1 Container  1 Container Oral BID BM Lavena BullionHeather W Baron, RD   1 Container at 03/31/13 1442  . furosemide (LASIX) tablet 20 mg  20 mg Oral Daily Kathlen ModyVijaya Akula, MD   20 mg at 03/31/13 0943  . HYDROcodone-acetaminophen (NORCO) 10-325 MG per tablet 1 tablet  1 tablet Oral BID PRN Kela MillinAdeline C Viyuoh, MD   1 tablet at 03/31/13 2212  . hydroxychloroquine (PLAQUENIL) tablet 200 mg  200 mg Oral Daily Kela MillinAdeline C Viyuoh, MD   200 mg at 03/31/13 0943  . hydrOXYzine (ATARAX/VISTARIL) tablet 10 mg  10 mg Oral Q6H PRN Adeline C Viyuoh, MD      . insulin aspart (novoLOG) injection 0-5 Units  0-5 Units Subcutaneous QHS Adeline C Viyuoh, MD      . insulin aspart (novoLOG) injection 0-9 Units  0-9 Units Subcutaneous TID WC Kela MillinAdeline C Viyuoh, MD   1 Units at 03/31/13 1726  . levalbuterol (XOPENEX) nebulizer solution  0.63 mg  0.63 mg Nebulization Q4H PRN Adeline C Viyuoh, MD      . ondansetron (ZOFRAN) tablet 4 mg  4 mg Oral Q6H PRN Adeline C Viyuoh, MD       Or  . ondansetron (ZOFRAN) injection 4 mg  4 mg Intravenous Q6H PRN Kela MillinAdeline C Viyuoh, MD      .  polyethylene glycol (MIRALAX / GLYCOLAX) packet 17 g  17 g Oral Daily Kela Millin, MD   17 g at 03/31/13 0943  . Rivaroxaban (XARELTO) tablet 15 mg  15 mg Oral Q supper Kela Millin, MD   15 mg at 03/31/13 1726  . rOPINIRole (REQUIP) tablet 1 mg  1 mg Oral BID Kela Millin, MD   1 mg at 03/31/13 2212  . sodium chloride 0.9 % injection 3 mL  3 mL Intravenous Q12H Kela Millin, MD   3 mL at 03/31/13 2215  . sodium chloride 0.9 % injection 3 mL  3 mL Intravenous Q12H Kela Millin, MD   3 mL at 03/31/13 0943  . sodium chloride 0.9 % injection 3 mL  3 mL Intravenous PRN Kela Millin, MD        Allergies as of 03/28/2013 - Review Complete 03/28/2013  Allergen Reaction Noted  . Amoxicillin-pot clavulanate  12/20/2011  . Doxycycline hyclate  12/20/2011  . Iodinated diagnostic agents  05/18/2010  . Nitrofuran derivatives  12/20/2011  . Norvasc [amlodipine besylate]  05/18/2010  . Plavix [clopidogrel bisulfate]  05/22/2009  . Sulfa antibiotics  12/18/2010  . Phenergan [promethazine hcl] Anxiety 07/09/2012    Review of Systems:    All systems reviewed and negative except where noted in HPI.   Physical Exam:  Vital signs in last 24 hours: Temp:  [97.3 F (36.3 C)-98.2 F (36.8 C)] 98.2 F (36.8 C) (02/22 0524) Pulse Rate:  [59] 59 (02/21 2052) Resp:  [16-19] 19 (02/22 0524) BP: (106-130)/(48-69) 130/69 mmHg (02/22 0524) SpO2:  [97 %-100 %] 98 % (02/22 0524) Weight:  [170 lb 3.1 oz (77.2 kg)] 170 lb 3.1 oz (77.2 kg) (02/22 0524) Last BM Date: 03/30/13 General:   Pleasant, obese, white female in NAD Head:  Normocephalic and atraumatic. Eyes:   No icterus.   Conjunctiva pink. Ears:  Normal auditory acuity. Neck:  Supple; no  masses felt Lungs: *Respirations even and unlabored. Fine inspiratory crackles in upper and lower fields   Heart:  Regular rate and rhythm Abdomen:  Soft, nondistended, mild RUQ tenderness. Normal bowel sounds. No appreciable masses or hepatomegaly.  Msk:  Symmetrical without gross deformities.  Extremities:  Without edema. Bilateral foot deformities (arthritic changes?) Neurologic:  Alert and  oriented x4;  grossly normal neurologically. No asterixis Skin:  Intact without significant lesions or rashes. Cervical Nodes:  No significant cervical adenopathy. Psych:  Alert and cooperative. Normal affect.  LAB RESULTS:  Recent Labs  03/31/13 0427  WBC 7.0  HGB 13.4  HCT 40.7  PLT 107*   BMET  Recent Labs  03/31/13 0427  NA 133*  K 4.9  CL 96  CO2 19  GLUCOSE 89  BUN 66*  CREATININE 1.34*  CALCIUM 9.0     Impression / Plan:   26. 80 year old female with multiple medical problems on multiple medications including Xarelto.   2. Encephalopathy, resolved. This was likely secondary to UTI / possibly hepatic encephalopathy. No asterixis on exam, mental status okay today.   3. Acute on chronic diastolic CHF, BNP 13,717. Diureses per admitting team.   4. Hypokalemia, resolved.   5. UTI, on Cipro  Thanks   LOS: 4 days   Willette Cluster  04/01/2013, 9:51 AM  GI Attending Note   Chart was reviewed and patient was examined. X-rays and lab were reviewed.   I prior CT she has nodularity of the liver suggestive of cirrhosis.  Encephalopathy  is likely related to her UTI and not hepatic encephalopathy.  She does not have obvious ascites at present.  No active GI issues.  She should have outpatient followup for chronic liver disease-Dr. Nicholos Johns. Arlyce Dice, M.D., [facility] Cell [phone]

## 2013-04-01 NOTE — Progress Notes (Signed)
[facility] MSW note: On call social worker met with pt. This is a hospice related admission. No family present in the room. Pt alert and engaging. Pt desires to return at hospital discharge. Pt discussed how son feels she needs to go to a SNF. MSW discussed the importance of safety in the home. Pt acknowledged the fact that she has "messed things up" at times with her bills. MSW discussed with pt how son wants pt safe. MSW explained that if pt does go to SNF then [facility] would discharge services so Medicare would pay skill days. Pt stated, " I know. I've done this before with nursing homes". Pt denied pain on visit. Discussed case with Horris Latino, staff RN. No set date for hospital discharge. Primary MSW-Molly Tyrone Schimke will follow up.   Marilynne Halsted, MSW [phone]

## 2013-04-01 NOTE — Progress Notes (Signed)
[facility] PROGRESS NOTE  Holly [name]:[id number] DOB: October 06, 1931 DOA: 03/28/2013 PCP: Holly Alken, MD Brief HPI: Holly [name] is a 80 y.o. female with multiple medical problems including type 2 diabetes mellitus, paroxysmal atrial fibrillation, CAD, CKD, type 2 diabetes mellitus(no meds listed on med rec), OSA, O2 dependent, Holly [name], who lives at home alone brought in by family with progressive confusion over the past several months. The family confusion recently worsened they her to her PCP who referred her to the ED today. Patient admits to occasional shortness of breath. She denies chest pain, cough, fevers, dysuria melena and no hematochezia. In the ED chest x-ray show slight interstitial prominence mild CHF not excluded CAD stent is noted, urinalysis consistent with UTI, BNP 15,398 (down from 24,000 January 2015). Her repeat potassium is still elevated at 5.5, her creatinine was 1.4 (from 1.58 on 1/18) she is admitted for further evaluation and management  Assessment/Plan: 1. Acute encephalopathy:  - probably worsening of her underlying dementia.  She appears to be lucid at this time.  - tsh and vit b12 ordered and they are within normal limits.  - MRI BRAIN ordered and negative for acute stroke.  - psychiatry consulted and started her on risperdal.   2. Acute on chronic diastolic CHF: - bnp elevated.  - diuresed appropriately , will change to po lasix.    3. Hyperkalemia:  - resolved.  - spironolactone held.   4. UTI: - URINE CULTURES show pseudomonas and enterococcus - on rocephin for 2 days , cipro for one day and levaquin from 2/22.   5. Diabetes Mellitus; SSI.   6. Atrial fibrillation:  Resume xarelto.   DVT prophylaxis.   Code Status: full code Family Communication: discussed the plan of care with t he patient, son  Disposition Plan: snf.   Consultants:  none  Procedures: none Antibiotics:  Rocephin 2/18 - 2/20  Ciprofloxacin 2/21-  2/22  levaquin 2/22  HPI/Subjective: Comfortable, denies any new complaints  Objective: Filed Vitals:   04/01/13 1405  BP: 108/60  Pulse: 59  Temp: 97.6 F (36.4 C)  Resp: 18    Intake/Output Summary (Last 24 hours) at 04/01/13 1637 Last data filed at 04/01/13 1478  Gross per 24 hour  Intake    240 ml  Output      0 ml  Net    240 ml   Filed Weights   03/30/13 0507 03/31/13 0502 04/01/13 0524  Weight: 82.4 kg (181 lb 10.5 oz) 77.7 kg (171 lb 4.8 oz) 77.2 kg (170 lb 3.1 oz)    Exam:   General:  Alert but confused  Cardiovascular: s1s2  Respiratory: ctab  Abdomen: soft NT ND BS+  Musculoskeletal: mild pedal edema.   Data Reviewed: Basic Metabolic Panel:  Recent Labs Lab 03/28/13 1535 03/28/13 1821 03/29/13 0355 03/31/13 0427  NA 133*  --  134* 133*  K 5.7* 5.5* 5.2 4.9  CL 100  --  99 96  CO2 18*  --  18* 19  GLUCOSE 119*  --  85 89  BUN 66*  --  66* 66*  CREATININE 1.40*  --  1.42* 1.34*  CALCIUM 9.4  --  9.1 9.0   Liver Function Tests:  Recent Labs Lab 03/28/13 1535  AST 26  ALT 17  ALKPHOS 103  BILITOT 1.3*  PROT 7.7  ALBUMIN 3.8   No results found for this basename: LIPASE, AMYLASE,  in the last 168 hours  Recent Labs Lab 03/28/13 1535  AMMONIA 34   CBC:  Recent Labs Lab 03/28/13 1535 03/29/13 0355 03/31/13 0427  WBC 7.0 8.0 7.0  NEUTROABS 4.5  --   --   HGB 13.3 13.0 13.4  HCT 40.1 40.2 40.7  MCV 89.9 90.1 89.5  PLT 96* 106* 107*   Cardiac Enzymes:  Recent Labs Lab 03/29/13 0005 03/29/13 0355 03/29/13 1125  TROPONINI <0.30 <0.30 <0.30   BNP (last 3 results)  Recent Labs  02/23/13 1600 03/28/13 1821 03/31/13 0427  PROBNP 24178.0* 15398.0* 13717.0*   CBG:  Recent Labs Lab 03/31/13 1156 03/31/13 1714 03/31/13 2125 04/01/13 0740 04/01/13 1142  GLUCAP 144* 132* 132* 95 120*    Recent Results (from the past 240 hour(s))  URINE CULTURE     Status: None   Collection Time    03/28/13  5:37 PM       Result Value Ref Range Status   Specimen Description URINE, CATHETERIZED   Final   Special Requests NONE   Final   Culture  Setup Time     Final   Value: 03/28/2013 22:45     Performed at [facility]   Colony Count     Final   Value: >=100,000 COLONIES/ML     Performed at [facility]   Culture     Final   Value: PSEUDOMONAS AERUGINOSA     ENTEROCOCCUS SPECIES     Performed at [facility]   Report Status 03/31/2013 FINAL   Final   Organism ID, Bacteria PSEUDOMONAS AERUGINOSA   Final   Organism ID, Bacteria ENTEROCOCCUS SPECIES   Final     Studies: No results found.  Scheduled Meds: . aspirin EC  81 mg Oral QHS  . carvedilol  3.125 mg Oral BID WC  . feeding supplement (RESOURCE BREEZE)  1 Container Oral BID BM  . furosemide  20 mg Oral Daily  . hydroxychloroquine  200 mg Oral Daily  . insulin aspart  0-5 Units Subcutaneous QHS  . insulin aspart  0-9 Units Subcutaneous TID WC  . levofloxacin  750 mg Oral Q48H  . polyethylene glycol  17 g Oral Daily  . Rivaroxaban  15 mg Oral Q supper  . rOPINIRole  1 mg Oral BID  . sodium chloride  3 mL Intravenous Q12H  . sodium chloride  3 mL Intravenous Q12H   Continuous Infusions:   Active Problems:   DIABETES MELLITUS, TYPE II   GERD   OSA (obstructive sleep apnea)   Atrial fibrillation   NASH (nonalcoholic steatohepatitis)   UTI (lower urinary tract infection)   Hyperkalemia    Time spent: 25 min    Holly [name]  [facility] Pager [phone]. If 7PM-7AM, please contact night-coverage at [email or web address], password [email or web address] 04/01/2013, 4:37 PM  LOS: 4 days

## 2013-04-02 DIAGNOSIS — N390 Urinary tract infection, site not specified: Secondary | ICD-10-CM

## 2013-04-02 DIAGNOSIS — F29 Unspecified psychosis not due to a substance or known physiological condition: Secondary | ICD-10-CM

## 2013-04-02 DIAGNOSIS — B952 Enterococcus as the cause of diseases classified elsewhere: Secondary | ICD-10-CM

## 2013-04-02 DIAGNOSIS — E875 Hyperkalemia: Secondary | ICD-10-CM

## 2013-04-02 DIAGNOSIS — I4891 Unspecified atrial fibrillation: Secondary | ICD-10-CM

## 2013-04-02 LAB — BASIC METABOLIC PANEL
BUN: 55 mg/dL — AB (ref 6–23)
CALCIUM: 9.1 mg/dL (ref 8.4–10.5)
CHLORIDE: 101 meq/L (ref 96–112)
CO2: 20 meq/L (ref 19–32)
CREATININE: 1.38 mg/dL — AB (ref 0.50–1.10)
GFR calc Af Amer: 41 mL/min — ABNORMAL LOW (ref >90)
GFR, EST NON AFRICAN AMERICAN: 35 mL/min — AB (ref >90)
GLUCOSE: 88 mg/dL (ref 70–99)
POTASSIUM: 4.7 meq/L (ref 3.7–5.3)
Sodium: 135 mEq/L — ABNORMAL LOW (ref 137–147)

## 2013-04-02 LAB — GLUCOSE, CAPILLARY
GLUCOSE-CAPILLARY: 118 mg/dL — AB (ref 70–99)
Glucose-Capillary: 97 mg/dL (ref 70–99)

## 2013-04-02 LAB — CBC
HEMATOCRIT: 38.9 % (ref 36.0–46.0)
Hemoglobin: 12.7 g/dL (ref 12.0–15.0)
MCH: 29.5 pg (ref 26.0–34.0)
MCHC: 32.6 g/dL (ref 30.0–36.0)
MCV: 90.3 fL (ref 78.0–100.0)
Platelets: 106 10*3/uL — ABNORMAL LOW (ref 150–400)
RBC: 4.31 MIL/uL (ref 3.87–5.11)
RDW: 18.8 % — ABNORMAL HIGH (ref 11.5–15.5)
WBC: 7.6 10*3/uL (ref 4.0–10.5)

## 2013-04-02 MED ORDER — BOOST / RESOURCE BREEZE PO LIQD: 1.0000 | Freq: Two times a day (BID) | ORAL | Status: DC

## 2013-04-02 MED ORDER — LEVOFLOXACIN 750 MG PO TABS: 750.0000 mg | ORAL_TABLET | ORAL | Status: DC

## 2013-04-02 MED ORDER — HYDROCODONE-ACETAMINOPHEN 10-500 MG PO TABS: 1.0000 | ORAL_TABLET | Freq: Two times a day (BID) | ORAL | Status: AC | PRN

## 2013-04-02 NOTE — Progress Notes (Signed)
Clinical Social Work Department CLINICAL SOCIAL WORK PLACEMENT NOTE 04/02/2013  Patient:  Holly [name],Holly [name]  Account Number:  [id number] Admit date:  03/28/2013  Clinical Social Worker:  Becky SaxBETHANY Ajanae Virag, LCSW  Date/time:  04/02/2013 12:00 M  Clinical Social Work is seeking post-discharge placement for this patient at the following level of care:   SKILLED NURSING   (*CSW will update this form in Epic as items are completed)   04/02/2013  Patient/family provided with [facility] Department of Clinical Social Work's list of facilities offering this level of care within the geographic area requested by the patient (or if unable, by the patient's family).  04/02/2013  Patient/family informed of their freedom to choose among providers that offer the needed level of care, that participate in Medicare, Medicaid or managed care program needed by the patient, have an available bed and are willing to accept the patient.  04/02/2013  Patient/family informed of [facility]' ownership interest in [facility], as well as of the fact that they are under no obligation to receive care at this facility.  PASARR submitted to EDS on 04/02/2013 PASARR number received from EDS on 04/02/2013  FL2 transmitted to all facilities in geographic area requested by pt/family on  04/02/2013 FL2 transmitted to all facilities within larger geographic area on 04/02/2013  Patient informed that his/her managed care company has contracts with or will negotiate with  certain facilities, including the following:     Patient/family informed of bed offers received:  04/02/2013 Patient chooses bed at  Physician recommends and patient chooses bed at    Patient to be transferred to  on  04/02/2013 Patient to be transferred to facility by ptar  The following physician request were entered in Epic:   Additional Comments: patient going to [facility] in [place].

## 2013-04-02 NOTE — Progress Notes (Signed)
Physical Therapy Treatment Patient Details Name: Holly HickmanJanel A [name] MRN: [id number] DOB: Jan 25, 1932 Today's Date: 04/02/2013 Time: 1207-1238 PT Time Calculation (min): 31 min  PT Assessment / Plan / Recommendation  History of Present Illness 80 y.o. female with multiple medical problems including type 2 diabetes mellitus, paroxysmal atrial fibrillation, CAD, CKD, type 2 diabetes mellitus(no meds listed on med rec), OSA, O2 dependent, Nash, who lives at home alone brought in by family with progressive confusion over the past several months. The family confusion recently  worsened they her to her PCP who referred her to the ED today. Patient admits to occasional shortness of breath. She denies chest pain, cough, fevers, dysuria melena and no hematochezia. In the ED chest x-ray show slight interstitial prominence mild CHF not excluded CAD stent is noted, urinalysis consistent with UTI, BNP 15,398 (down from 24,000 January 2015). Her repeat potassium is still elevated at 5.5, her creatinine was 1.4 (from 1.58 on 1/18) she is admitted for further evaluation and management.   PT Comments   Assisted pt with side to side rolling bed mobility for hygiene as pt was incont urine and to also place [facility] Lift pad under pt.  Assisted OOB to pt's personal wc with [facility] lift.  Had pt perform self W/C mobility in hallway.   Follow Up Recommendations  SNF     Does the patient have the potential to tolerate intense rehabilitation     Barriers to Discharge        Equipment Recommendations       Recommendations for Other Services    Frequency Min 2X/week   Progress towards PT Goals Progress towards PT goals: Progressing toward goals  Plan      Precautions / Restrictions Precautions Precautions: Fall Precaution Comments: wc bound several years Restrictions Weight Bearing Restrictions: Yes Other Position/Activity Restrictions: non amb several years   Pertinent Vitals/Pain C/o nausea during session Reported  to RN    Mobility  Bed Mobility Overal bed mobility: Needs Assistance Bed Mobility: Rolling Rolling: Mod assist General bed mobility comments: side to side rolling to place HOYER pad with use of rail and pt was able to flex both knees up. Transfers General transfer comment: HOYER lift [facility] then to her personal wc Wheelchair Mobility Wheelchair mobility: Yes Wheelchair propulsion: Both lower extermities;Both upper extremities Distance: 75 feet on 3 lts O2 sats avg 96% with decreased tolerance due to increased c/o nausea today.  Pt states "sometimes I get dizzy when I am up". Reported to RN.    PT Goals (current goals can now be found in the care plan section)    Visit Information  Last PT Received On: 04/02/13 Assistance Needed: +2 History of Present Illness: 80 y.o. female with multiple medical problems including type 2 diabetes mellitus, paroxysmal atrial fibrillation, CAD, CKD, type 2 diabetes mellitus(no meds listed on med rec), OSA, O2 dependent, Elita Booneash, who lives at home alone brought in by family with progressive confusion over the past several months. The family confusion recently  worsened they her to her PCP who referred her to the ED today. Patient admits to occasional shortness of breath. She denies chest pain, cough, fevers, dysuria melena and no hematochezia. In the ED chest x-ray show slight interstitial prominence mild CHF not excluded CAD stent is noted, urinalysis consistent with UTI, BNP 15,398 (down from 24,000 January 2015). Her repeat potassium is still elevated at 5.5, her creatinine was 1.4 (from 1.58 on 1/18) she is admitted for  further evaluation and management.    Subjective Data      Cognition       Balance     End of Session PT - End of Session Equipment Utilized During Treatment: Oxygen Activity Tolerance: Patient tolerated treatment well Patient left: in chair;with call bell/phone within reach;with family/visitor present (personal  wc) Nurse Communication: Need for lift equipment   Felecia Shelling  PTA [facility]  Acute  Rehab Pager      [phone]

## 2013-04-02 NOTE — Discharge Summary (Signed)
Physician Discharge Summary  Holly [name]:[id number] DOB: 1931-07-10 DOA: 03/28/2013  PCP: [name], MD  Admit date: 03/28/2013 Discharge date: 04/02/2013  Time spent: 30  minutes  Recommendations for Outpatient Follow-up:  1. Follow up with PCP in one week 2. Follow up with neurology for evaluation and management of dementia  Discharge Diagnoses:  Active Problems:   DIABETES MELLITUS, TYPE II   GERD   OSA (obstructive sleep apnea)   Atrial fibrillation   NASH (nonalcoholic steatohepatitis)   UTI (lower urinary tract infection)   Hyperkalemia   Enterococcus UTI   Discharge Condition: improved.   Diet recommendation: carb modified diet  Filed Weights   03/31/13 0502 04/01/13 0524 04/02/13 0439  Weight: 77.7 kg (171 lb 4.8 oz) 77.2 kg (170 lb 3.1 oz) 74.844 kg (165 lb)    History of present illness:  Holly [name] is a 80 y.o. female with multiple medical problems including type 2 diabetes mellitus, paroxysmal atrial fibrillation, CAD, CKD, type 2 diabetes mellitus(no meds listed on med rec), OSA, O2 dependent, Nash, who lives at home alone brought in by family with progressive confusion over the past several months. The family confusion recently worsened they her to her PCP who referred her to the ED today. Patient admits to occasional shortness of breath. She denies chest pain, cough, fevers, dysuria melena and no hematochezia. In the ED chest x-ray show slight interstitial prominence mild CHF not excluded CAD stent is noted, urinalysis consistent with UTI, BNP 15,398 (down from 24,000 January 2015). Her repeat potassium is still elevated at 5.5, her creatinine was 1.4 (from 1.58 on 1/18) she is admitted for further evaluation and management   Hospital Course:  1. Acute encephalopathy:  - probably worsening of her underlying dementia. She appears to be lucid at this time.  - tsh and vit b12 ordered and they are within normal limits.  - MRI BRAIN ordered and  negative for acute stroke.  - psychiatry consulted and started her on risperdal.  - Out patient neurology follow up  2. Acute on chronic diastolic CHF:  - bnp elevated.  - diuresed appropriately ,  change to po lasix.  3. Hyperkalemia:  - resolved.  - spironolactone held.  4. UTI:  - URINE CULTURES show pseudomonas and enterococcus  - on rocephin for 2 days , cipro for one day and levaquin from 2/22.  5. Diabetes Mellitus;  SSI.  6. Atrial fibrillation:  Resume xarelto.    Procedures:  MRI BRAIN.   Consultations:  psychaitry  Discharge Exam: Filed Vitals:   04/02/13 0439  BP: 106/61  Pulse: 68  Temp: 97.7 F (36.5 C)  Resp: 18    General: Alert afebrile confused. Cardiovascular: s1s2 Respiratory: ctab Abdomen: soft NT ND BS+ Musculoskeletal: mild pedal edema.    Discharge Instructions   Future Appointments Provider Department Dept Phone   06/20/2013 1:30 PM Barbaraann Share, MD [facility] [phone]       Medication List    STOP taking these medications       spironolactone 50 MG tablet  Commonly known as:  ALDACTONE      TAKE these medications       ALPRAZolam 0.25 MG tablet  Commonly known as:  XANAX  Take 1 tablet (0.25 mg total) by mouth at bedtime as needed for anxiety or sleep.     aspirin EC 81 MG tablet  Take 81 mg by mouth at bedtime.     carvedilol 3.125 MG  tablet  Commonly known as:  COREG  Take 1 tablet (3.125 mg total) by mouth 2 (two) times daily with a meal.     feeding supplement (RESOURCE BREEZE) Liqd  Take 1 Container by mouth 2 (two) times daily between meals.     furosemide 20 MG tablet  Commonly known as:  LASIX  Take 1 tablet (20 mg total) by mouth daily.     HYDROcodone-acetaminophen 10-500 MG per tablet  Commonly known as:  LORTAB  Take 1 tablet by mouth 2 (two) times daily as needed for pain.     hydroxychloroquine 200 MG tablet  Commonly known as:  PLAQUENIL  Take 200 mg by mouth daily.      hydrOXYzine 10 MG tablet  Commonly known as:  ATARAX/VISTARIL  Take 1 tablet (10 mg total) by mouth every 6 (six) hours as needed for itching.     levalbuterol 0.63 MG/3ML nebulizer solution  Commonly known as:  XOPENEX  Take 3 mLs (0.63 mg total) by nebulization every 4 (four) hours as needed for wheezing or shortness of breath.     levofloxacin 750 MG tablet  Commonly known as:  LEVAQUIN  Take 1 tablet (750 mg total) by mouth every other day.     polyethylene glycol packet  Commonly known as:  MIRALAX / GLYCOLAX  Take 17 g by mouth daily.     Rivaroxaban 15 MG Tabs tablet  Commonly known as:  XARELTO  Take 1 tablet (15 mg total) by mouth daily with supper.     rOPINIRole 0.5 MG tablet  Commonly known as:  REQUIP  Take 1 mg by mouth 2 (two) times daily.       Allergies  Allergen Reactions  . Amoxicillin-Pot Clavulanate     Diarrhea ,fatigue mouth sores   . Doxycycline Hyclate   . Iodinated Diagnostic Agents     Full body rash   . Nitrofuran Derivatives   . Norvasc [Amlodipine Besylate]     Leg swelling  . Plavix [Clopidogrel Bisulfate]     REACTION: SOB, and rash  . Sulfa Antibiotics     And adhesive tape  . Phenergan [Promethazine Hcl] Anxiety      The results of significant diagnostics from this hospitalization (including imaging, microbiology, ancillary and laboratory) are listed below for reference.    Significant Diagnostic Studies: Dg Chest 2 View  03/28/2013   CLINICAL DATA:  Shortness of breath.  EXAM: CHEST  2 VIEW  COMPARISON:  DG CHEST 2 VIEW dated 02/23/2013; DG CHEST 1V PORT dated 07/12/2012; DG CHEST 1V PORT dated 07/11/2012  FINDINGS: Mediastinum and hilar structures are stable. Persistent cardiomegaly and mild pulmonary vascular prominence noted. Slight interstitial prominence noted. A component of congestive heart failure cannot be excluded. Coronary artery stents noted. Poor inspiration with basilar atelectasis and/or infiltrate. No pleural effusion or  pneumothorax. Diffuse degenerative change in osteopenia thoracic spine. Degenerative changes thoracic spine and both shoulders. Thoracic spine compression fractures noted.  IMPRESSION: 1. Mild congestive heart failure cannot be excluded. Coronary artery stents noted. 2. Poor inspiration with basilar atelectasis and/or infiltrates/ edema.   Electronically Signed   By: Maisie Fushomas  Register   On: 03/28/2013 15:47   Ct Head Wo Contrast  03/28/2013   CLINICAL DATA:  Altered mental status  EXAM: CT HEAD WITHOUT CONTRAST  TECHNIQUE: Contiguous axial images were obtained from the base of the skull through the vertex without intravenous contrast.  COMPARISON:  CT 02/23/2013  FINDINGS: Chronic infarct right occipital parietal lobe  is unchanged. Chronic microvascular ischemic change in the white matter.  Negative for acute infarct. Negative for hemorrhage or mass. Atherosclerotic calcification. No acute skull abnormality.  IMPRESSION: Chronic ischemia.  No acute abnormality.   Electronically Signed   By: Marlan Palau M.D.   On: 03/28/2013 16:20   Mr Brain Wo Contrast  03/29/2013   CLINICAL DATA:  Altered mental status.  EXAM: MRI HEAD WITHOUT CONTRAST  TECHNIQUE: Multiplanar, multiecho pulse sequences of the brain and surrounding structures were obtained without intravenous contrast.  COMPARISON:  CT HEAD W/O CM dated 03/28/2013  FINDINGS: Images are moderately degraded by motion artifact despite the patient receiving medication for anxiolysis and sequences being repeated.  Incidental note is made of a partially empty sella. There is no evidence of acute infarct. Encephalomalacia is noted related to remote right occipital lobe infarct. Patchy T2 hyperintensities in the subcortical and deep cerebral white matter are nonspecific but compatible with mild to moderate chronic small vessel ischemic disease. There is no evidence of mass, midline shift, intracranial hemorrhage, or extra-axial fluid collection. There is moderate  cerebral atrophy.  Prior left cataract surgery is noted. There may be a small amount of mastoid fluid bilaterally. Major intracranial vascular flow voids are grossly preserved. Paranasal sinuses are clear.  IMPRESSION: 1. Moderately motion degraded examination without evidence of acute infarct or other acute intracranial abnormality. 2. Remote right occipital infarct and mild-to-moderate chronic small vessel ischemic disease.   Electronically Signed   By: Sebastian Ache   On: 03/29/2013 18:21    Microbiology: Recent Results (from the past 240 hour(s))  URINE CULTURE     Status: None   Collection Time    03/28/13  5:37 PM      Result Value Ref Range Status   Specimen Description URINE, CATHETERIZED   Final   Special Requests NONE   Final   Culture  Setup Time     Final   Value: 03/28/2013 22:45     Performed at [facility]   Colony Count     Final   Value: >=100,000 COLONIES/ML     Performed at [facility]   Culture     Final   Value: PSEUDOMONAS AERUGINOSA     ENTEROCOCCUS SPECIES     Performed at [facility]   Report Status 03/31/2013 FINAL   Final   Organism ID, Bacteria PSEUDOMONAS AERUGINOSA   Final   Organism ID, Bacteria ENTEROCOCCUS SPECIES   Final     Labs: Basic Metabolic Panel:  Recent Labs Lab 03/28/13 1535 03/28/13 1821 03/29/13 0355 03/31/13 0427 04/02/13 0456  NA 133*  --  134* 133* 135*  K 5.7* 5.5* 5.2 4.9 4.7  CL 100  --  99 96 101  CO2 18*  --  18* 19 20  GLUCOSE 119*  --  85 89 88  BUN 66*  --  66* 66* 55*  CREATININE 1.40*  --  1.42* 1.34* 1.38*  CALCIUM 9.4  --  9.1 9.0 9.1   Liver Function Tests:  Recent Labs Lab 03/28/13 1535  AST 26  ALT 17  ALKPHOS 103  BILITOT 1.3*  PROT 7.7  ALBUMIN 3.8   No results found for this basename: LIPASE, AMYLASE,  in the last 168 hours  Recent Labs Lab 03/28/13 1535  AMMONIA 34   CBC:  Recent Labs Lab 03/28/13 1535 03/29/13 0355 03/31/13 0427 04/02/13 0456  WBC 7.0  8.0 7.0 7.6  NEUTROABS 4.5  --   --   --  HGB 13.3 13.0 13.4 12.7  HCT 40.1 40.2 40.7 38.9  MCV 89.9 90.1 89.5 90.3  PLT 96* 106* 107* 106*   Cardiac Enzymes:  Recent Labs Lab 03/29/13 0005 03/29/13 0355 03/29/13 1125  TROPONINI <0.30 <0.30 <0.30   BNP: BNP (last 3 results)  Recent Labs  02/23/13 1600 03/28/13 1821 03/31/13 0427  PROBNP 24178.0* 15398.0* 13717.0*   CBG:  Recent Labs Lab 04/01/13 0740 04/01/13 1142 04/01/13 1655 04/01/13 2022 04/02/13 0723  GLUCAP 95 120* 168* 90 97       Signed:  Machel Violante  [facility] 04/02/2013, 12:48 PM
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
## 2013-04-02 NOTE — Progress Notes (Addendum)
Patient will be discharged today. [name] is able to accept patient. CSW submitted information for blue medicare auth.  Hartley Urton C. Eyal Greenhaw MSW, Alexander MtLCSW [phone] CSW called patient's son and gave him an update on the fact that [name] a bed. Son was very excited about the fact that they offered a bed as he didn't think that they would have a bed available.  Davionte Lusby C. Mildred Tuccillo MSW, LCSW [phone]

## 2013-04-02 NOTE — Progress Notes (Signed)
[facility] Sw note:  Sw reviewed chart, noted Pt will be going to SNF today. Pt in bed, she stated that she thought she was leaving the hospital today, Pt stated "I want to go home but I can't." Pt unable to remember how long she had been in the hospital or what day it was today. Pt seemed focused on "getting my taxes done" and trying to convince her family she could still pay her bills she stated "they think I have missed paying my bills but I know for sure I have not." Sw called and spoke to Pt's son/POA Rob who stated plans are in place for Pt to go to [facility] today, this facility is nearer to Rob's home, he verbalized understanding that Pt will be revoking her Hospice Medicare Benefit (HMB) services as she is entering a SNF and one that is out of [facility], Rob's wife has signed the HMB revocation form and it will be effective when Pt enters [facility] today 04/02/13. Lorain ChildesMolly Lyle, [place]CSW, ACHP-SW [facility] [phone]

## 2013-04-02 NOTE — Progress Notes (Signed)
[employer] medicare has issued a denial for snf. CSW notified both facility and Patient's son. Patient's son is willing to private pay. He has made [facility] arrangements [facility]. [facility] that [facility] are ready [facility] and to go ahead and send patient. Packet copied and placed in [place]. ptar called for transportation.  Parley Pidcock C. Armani Brar MSW, LCSW [phone]

## 2013-04-03 ENCOUNTER — Ambulatory Visit: Payer: Medicare Other | Admitting: Internal Medicine

## 2013-05-16 ENCOUNTER — Encounter: Payer: Self-pay | Admitting: General Surgery

## 2013-05-17 ENCOUNTER — Telehealth: Payer: Self-pay | Admitting: General Surgery

## 2013-05-17 ENCOUNTER — Encounter: Payer: Self-pay | Admitting: Cardiology

## 2013-05-17 ENCOUNTER — Ambulatory Visit (INDEPENDENT_AMBULATORY_CARE_PROVIDER_SITE_OTHER): Payer: Medicare Other | Admitting: Cardiology

## 2013-05-17 VITALS — BP 115/58 | Ht 62.0 in | Wt 188.0 lb

## 2013-05-17 DIAGNOSIS — E785 Hyperlipidemia, unspecified: Secondary | ICD-10-CM | POA: Insufficient documentation

## 2013-05-17 DIAGNOSIS — I5032 Chronic diastolic (congestive) heart failure: Secondary | ICD-10-CM

## 2013-05-17 DIAGNOSIS — I482 Chronic atrial fibrillation, unspecified: Secondary | ICD-10-CM

## 2013-05-17 DIAGNOSIS — I2510 Atherosclerotic heart disease of native coronary artery without angina pectoris: Secondary | ICD-10-CM

## 2013-05-17 DIAGNOSIS — I2789 Other specified pulmonary heart diseases: Secondary | ICD-10-CM

## 2013-05-17 DIAGNOSIS — G4733 Obstructive sleep apnea (adult) (pediatric): Secondary | ICD-10-CM

## 2013-05-17 DIAGNOSIS — R0602 Shortness of breath: Secondary | ICD-10-CM

## 2013-05-17 DIAGNOSIS — I4891 Unspecified atrial fibrillation: Secondary | ICD-10-CM

## 2013-05-17 LAB — BASIC METABOLIC PANEL
BUN: 35 mg/dL — ABNORMAL HIGH (ref 6–23)
CHLORIDE: 101 meq/L (ref 96–112)
CO2: 29 mEq/L (ref 19–32)
CREATININE: 1.7 mg/dL — AB (ref 0.4–1.2)
Calcium: 8.6 mg/dL (ref 8.4–10.5)
GFR: 29.96 mL/min — ABNORMAL LOW (ref >60.00)
Glucose, Bld: 75 mg/dL (ref 70–99)
Potassium: 3.4 mEq/L — ABNORMAL LOW (ref 3.5–5.1)
Sodium: 135 mEq/L (ref 135–145)

## 2013-05-17 LAB — BRAIN NATRIURETIC PEPTIDE: PRO B NATRI PEPTIDE: 1228 pg/mL — AB (ref 0.0–100.0)

## 2013-05-17 NOTE — Progress Notes (Signed)
[address] [place], [place]  [address] Phone: [phone] Fax:  [phone]  Date:  05/17/2013   ID:  Holly [name], DOB [birth year], MRN [id number]  PCP:  Holly Alken, MD  Cardiologist:  Holly Magic, MD     History of Present Illness: Holly [name] is a 80 y.o. female with a history of ASCAD with PCI of LAD 8/01 , NSTEMI 12/10 and cath 1/11 for anterior ischemia on nuclear stress test with instent restenosis of the LAD treated with overlapping DES stents, DM, HTN, dyslipidemia, chronic atrial fibrillation on chronic anticoagulation, OSA now off CPAP, chronic diastolic CHF who presents today for followup.  She is doing well.  She denies any chest pain, LE edema ,dizziness, palpitations or syncope.  She has chronic SOB which is stable on home O2.  Since I saw her last she has stopped her CPAP.  She was using a full face mask.  She was taken off ASA and Xarelto a few weeks ago due to epistaxis and blood in her stool.    Wt Readings from Last 3 Encounters:  05/17/13 188 lb (85.276 kg)  04/02/13 165 lb (74.844 kg)  02/26/13 181 lb (82.1 kg)     Past Medical History  Diagnosis Date  . Myalgia and myositis, unspecified   . Esophageal reflux   . Degeneration of intervertebral disc, site unspecified   . Morbid obesity   . Osteoarthrosis, unspecified whether generalized or localized, unspecified site   . Rheumatoid arthritis(714.0)   . Type II or unspecified type diabetes mellitus without mention of complication, not stated as uncontrolled   . Chronic kidney disease   . Thrombocytopenia   . Hypertension   . Hyperlipidemia   . OSA (obstructive sleep apnea)   . Interstitial lung disease   . Atrial fibrillation     CHRONIC  . Anemia   . Vitamin D deficiency   . Cirrhosis     NASH  . Diabetes   . Hospice care 07/2012    primary dx pulmonary fibrosis  . Obesity   . Wheelchair bound   . Schatzki's ring     h/o s/p dilatation currently doign well.   . ARF  (acute renal failure) 12/10    and pyelonephritis  . Autoimmune disease   . Urinary incontinence   . Lumbar disc disease   . Electrolyte abnormality   . Pulmonary HTN     secondary   . Chronic diastolic heart failure   . Spinal stenosis   . Edema   . CAD (coronary artery disease)     ASCVD, single vessel: s/p PTCA stent LAD 09/1999, NSTEMI 12/10 with cath and PCI of LAD for instent restenosis 1/11    Current Outpatient Prescriptions  Medication Sig Dispense Refill  . Alcaftadine 0.25 % SOLN Apply to eye.      Marland Kitchen ALPRAZolam (XANAX) 0.25 MG tablet Take 1 tablet (0.25 mg total) by mouth at bedtime as needed for anxiety or sleep.  30 tablet  0  . carvedilol (COREG) 3.125 MG tablet Take 1 tablet (3.125 mg total) by mouth 2 (two) times daily with a meal.  60 tablet  0  . furosemide (LASIX) 20 MG tablet Take 1 tablet (20 mg total) by mouth daily.  90 tablet  0  . HYDROcodone-acetaminophen (LORTAB) 10-500 MG per tablet Take 1 tablet by mouth 2 (two) times daily as needed for pain.  10 tablet  0  . hydroxychloroquine (PLAQUENIL) 200 MG tablet  Take 200 mg by mouth daily.       Marland Kitchen. levalbuterol (XOPENEX) 0.63 MG/3ML nebulizer solution Take 3 mLs (0.63 mg total) by nebulization every 4 (four) hours as needed for wheezing or shortness of breath.  3 mL  12  . Melatonin 3 MG CAPS Take by mouth.      . Menthol-Zinc Oxide (CALMOSEPTINE) 0.44-20.625 % OINT Apply topically.      . nystatin-triamcinolone (MYCOLOG II) cream Apply 1 application topically 2 (two) times daily.      . pantoprazole (PROTONIX) 40 MG tablet Take 40 mg by mouth 2 (two) times daily.      . polyethylene glycol (MIRALAX / GLYCOLAX) packet Take 17 g by mouth daily.  14 each  0  . potassium chloride SA (K-DUR,KLOR-CON) 20 MEQ tablet Take 20 mEq by mouth 2 (two) times daily.      Marland Kitchen. rOPINIRole (REQUIP) 0.5 MG tablet Take 1 mg by mouth 2 (two) times daily.        No current facility-administered medications for this visit.    Allergies:     Allergies  Allergen Reactions  . Amoxicillin-Pot Clavulanate     Diarrhea ,fatigue mouth sores   . Doxycycline Hyclate   . Iodinated Diagnostic Agents     Full body rash   . Nitrofuran Derivatives   . Norvasc [Amlodipine Besylate]     Leg swelling  . Plavix [Clopidogrel Bisulfate]     REACTION: SOB, and rash  . Sulfa Antibiotics     Nausea and felt terrible   . Tape   . Phenergan [Promethazine Hcl] Anxiety  . Plavix [Clopidogrel Bisulfate] Rash    Social History:  The patient  reports that she quit smoking about 36 years ago. Her smoking use included Cigarettes. She has a 17 pack-year smoking history. She has never used smokeless tobacco. She reports that she does not drink alcohol or use illicit drugs.   Family History:  The patient's family history includes Rheum arthritis in her mother.   ROS:  Please see the history of present illness.      All other systems reviewed and negative.   PHYSICAL EXAM: VS:  BP 115/58  Ht 5\' 2"  (1.575 m)  Wt 188 lb (85.276 kg)  BMI 34.38 kg/m2 Well nourished, well developed, in no acute distress HEENT: normal Neck: no JVD Cardiac:  normal S1, S2; RRR; no murmur Lungs:  clear to auscultation bilaterally, no wheezing, rhonchi or rales Abd: soft, nontender, no hepatomegaly Ext: no edema Skin: warm and dry Neuro:  CNs 2-12 intact, no focal abnormalities noted       ASSESSMENT AND PLAN:  1.  Chronic diastolic CHF - she has some crackles on exam today but no LE edema.  She has had some problems with O2 desats at the NH and her O2 has had to be adjusted.  She is very sedentary and does not take in deep breaths so she may have some chronic atelectasis. - check BNP to make sure she is not volume overloaded.  - BMET - continue Coreg/Lasix 2.  HTN - well controlled - continue Coreg 3.  Chronic atrial fibrillation rate controlled - currently off Apixaban per her care giver for epistaxis and rectal bleeding although our office was never  contacted regarding this.  I will try to get records to see if rectal bleeding and epistaxis was evaluated.  She has a very high CHADs2 Vasc score and needs to be on anticoagulation. - continue Coreg for rate  control 4.  OSA currently off CPAP because she does not like the full face mask.  I will change her to a nasal pillow mask with O2 at night and recheck a download in 4 weeks 5.  ASCAD with no angina  6.  Dyslipidemia with LDL at goal  Followup with me in 6 months  Signed, Holly Magic, MD 05/17/2013 11:03 AM

## 2013-05-17 NOTE — Telephone Encounter (Signed)
Called and spoke with Misty StanleyLisa the nurse of our pt, and she stated Xarelto and Aspirin was stopped on 3/31 due to blood in pts stool. They are currently waiting on her to have a Bowel movement to see if she is ok to possible go back on the medications or just Xarelto. They are faxing over the lab work to Dr Mayford Knifeurner to make her aware and will be keeping us updated with any medication changes from now on.   To Dr Mayford Knifeurner as a Lorain ChildesFYI

## 2013-05-17 NOTE — Patient Instructions (Signed)
Your physician recommends that you continue on your current medications as directed. Please refer to the Current Medication list given to you today.  Your physician recommends that you have a BNP and BMET today in our lab  We will Order a ResMed Airfit P10 mask with Chin strap for you CPAP machine and get a download in 4 weeks.  Your physician wants you to follow-up in: 6 months with Dr Sherlyn Lickurner You will receive a reminder letter in the mail two months in advance. If you don't receive a letter, please call our office to schedule the follow-up appointment.

## 2013-05-30 ENCOUNTER — Telehealth: Payer: Self-pay | Admitting: Cardiology

## 2013-05-30 NOTE — Telephone Encounter (Signed)
New message     Question about progress notes of 4-9 and CPAP info-----she cannot read the handwriting.

## 2013-05-31 ENCOUNTER — Encounter: Payer: Self-pay | Admitting: General Surgery

## 2013-05-31 NOTE — Telephone Encounter (Signed)
Spoke with Nurse and also called [facility] to make them aware of new phone number to call for pt. I will fax over labs for pt. They will draw labs for us as well.

## 2013-06-11 ENCOUNTER — Encounter: Payer: Self-pay | Admitting: Pulmonary Disease

## 2013-06-20 ENCOUNTER — Ambulatory Visit: Admitting: Pulmonary Disease

## 2013-06-24 ENCOUNTER — Encounter: Payer: Self-pay | Admitting: Cardiology

## 2013-07-06 ENCOUNTER — Ambulatory Visit: Payer: BC Managed Care – PPO | Admitting: Pulmonary Disease

## 2013-07-09 DEATH — deceased

## 2013-07-27 ENCOUNTER — Ambulatory Visit: Payer: BC Managed Care – PPO | Admitting: Pulmonary Disease
